# Patient Record
Sex: Male | Born: 1970 | Race: White | Hispanic: No | Marital: Single | Smoking: Current some day smoker
Health system: Southern US, Community
[De-identification: ages and names within clinical notes are randomized; demographics above are authoritative.]

## PROBLEM LIST (undated history)

## (undated) DIAGNOSIS — R51 Headache: Secondary | ICD-10-CM

## (undated) DIAGNOSIS — K219 Gastro-esophageal reflux disease without esophagitis: Secondary | ICD-10-CM

## (undated) DIAGNOSIS — T4145XA Adverse effect of unspecified anesthetic, initial encounter: Secondary | ICD-10-CM

## (undated) DIAGNOSIS — M545 Low back pain, unspecified: Secondary | ICD-10-CM

## (undated) DIAGNOSIS — T8859XA Other complications of anesthesia, initial encounter: Secondary | ICD-10-CM

## (undated) DIAGNOSIS — Z87442 Personal history of urinary calculi: Secondary | ICD-10-CM

## (undated) DIAGNOSIS — F4310 Post-traumatic stress disorder, unspecified: Secondary | ICD-10-CM

## (undated) DIAGNOSIS — K859 Acute pancreatitis without necrosis or infection, unspecified: Secondary | ICD-10-CM

## (undated) DIAGNOSIS — R519 Headache, unspecified: Secondary | ICD-10-CM

## (undated) DIAGNOSIS — F32A Depression, unspecified: Secondary | ICD-10-CM

## (undated) DIAGNOSIS — M1990 Unspecified osteoarthritis, unspecified site: Secondary | ICD-10-CM

## (undated) DIAGNOSIS — Z8489 Family history of other specified conditions: Secondary | ICD-10-CM

## (undated) DIAGNOSIS — M79609 Pain in unspecified limb: Secondary | ICD-10-CM

## (undated) DIAGNOSIS — G629 Polyneuropathy, unspecified: Secondary | ICD-10-CM

## (undated) DIAGNOSIS — Z862 Personal history of diseases of the blood and blood-forming organs and certain disorders involving the immune mechanism: Secondary | ICD-10-CM

## (undated) DIAGNOSIS — F329 Major depressive disorder, single episode, unspecified: Secondary | ICD-10-CM

## (undated) DIAGNOSIS — M419 Scoliosis, unspecified: Secondary | ICD-10-CM

## (undated) DIAGNOSIS — M5020 Other cervical disc displacement, unspecified cervical region: Secondary | ICD-10-CM

## (undated) HISTORY — DX: Scoliosis, unspecified: M41.9

## (undated) HISTORY — DX: Low back pain, unspecified: M54.50

## (undated) HISTORY — DX: Headache: R51

## (undated) HISTORY — DX: Pain in unspecified limb: M79.609

## (undated) HISTORY — DX: Post-traumatic stress disorder, unspecified: F43.10

## (undated) HISTORY — DX: Major depressive disorder, single episode, unspecified: F32.9

## (undated) HISTORY — DX: Depression, unspecified: F32.A

## (undated) HISTORY — PX: ESOPHAGOGASTRODUODENOSCOPY: SHX1529

## (undated) HISTORY — DX: Other cervical disc displacement, unspecified cervical region: M50.20

## (undated) HISTORY — DX: Low back pain: M54.5

## (undated) HISTORY — DX: Headache, unspecified: R51.9

---
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
## 1995-09-15 HISTORY — PX: SALIVARY GLAND SURGERY: SHX768

## 1995-09-15 HISTORY — PX: PAROTIDECTOMY: SUR1003

## 2006-06-03 ENCOUNTER — Ambulatory Visit: Payer: Self-pay | Admitting: Gastroenterology

## 2006-06-09 ENCOUNTER — Ambulatory Visit: Payer: Self-pay | Admitting: Gastroenterology

## 2006-06-11 ENCOUNTER — Other Ambulatory Visit: Payer: Self-pay

## 2006-06-15 ENCOUNTER — Ambulatory Visit: Payer: Self-pay | Admitting: Surgery

## 2006-09-14 HISTORY — PX: CHOLECYSTECTOMY: SHX55

## 2008-03-07 ENCOUNTER — Ambulatory Visit: Payer: Self-pay | Admitting: Gastroenterology

## 2008-04-04 ENCOUNTER — Ambulatory Visit: Payer: Self-pay | Admitting: Gastroenterology

## 2008-05-29 ENCOUNTER — Ambulatory Visit: Payer: Self-pay | Admitting: Gastroenterology

## 2008-09-14 HISTORY — PX: COLONOSCOPY: SHX174

## 2011-02-26 ENCOUNTER — Ambulatory Visit: Payer: Self-pay | Admitting: Internal Medicine

## 2011-03-10 ENCOUNTER — Ambulatory Visit: Payer: Self-pay | Admitting: Internal Medicine

## 2011-04-23 ENCOUNTER — Other Ambulatory Visit: Payer: Self-pay | Admitting: Neurology

## 2011-04-23 DIAGNOSIS — M79605 Pain in left leg: Secondary | ICD-10-CM

## 2011-04-23 DIAGNOSIS — M545 Low back pain: Secondary | ICD-10-CM

## 2011-04-29 ENCOUNTER — Ambulatory Visit
Admission: RE | Admit: 2011-04-29 | Discharge: 2011-04-29 | Disposition: A | Discharge status: Home / Self Care | Payer: Medicaid Other | Source: Ambulatory Visit | Attending: Neurology | Admitting: Neurology

## 2011-04-29 DIAGNOSIS — M545 Low back pain: Secondary | ICD-10-CM

## 2011-04-29 DIAGNOSIS — M79605 Pain in left leg: Secondary | ICD-10-CM

## 2011-04-29 MED ORDER — METHYLPREDNISOLONE ACETATE 40 MG/ML INJ SUSP (RADIOLOG
120.0000 mg | Freq: Once | INTRAMUSCULAR | Status: AC
  Administered 2011-04-29: 120 mg via EPIDURAL

## 2011-04-29 MED ORDER — IOHEXOL 180 MG/ML  SOLN
1.0000 mL | Freq: Once | INTRAMUSCULAR | Status: AC | PRN
  Administered 2011-04-29: 1 mL via INTRA_ARTICULAR

## 2011-06-05 ENCOUNTER — Ambulatory Visit: Payer: Medicaid Other | Admitting: Physical Medicine & Rehabilitation

## 2011-06-23 ENCOUNTER — Encounter: Payer: Medicare Other | Attending: Physical Medicine & Rehabilitation

## 2011-06-23 ENCOUNTER — Ambulatory Visit: Payer: Medicaid Other | Admitting: Physical Medicine & Rehabilitation

## 2011-06-23 DIAGNOSIS — M4130 Thoracogenic scoliosis, site unspecified: Secondary | ICD-10-CM | POA: Insufficient documentation

## 2011-06-23 DIAGNOSIS — M533 Sacrococcygeal disorders, not elsewhere classified: Secondary | ICD-10-CM

## 2011-06-23 DIAGNOSIS — IMO0001 Reserved for inherently not codable concepts without codable children: Secondary | ICD-10-CM | POA: Insufficient documentation

## 2011-06-23 DIAGNOSIS — M545 Low back pain, unspecified: Secondary | ICD-10-CM | POA: Insufficient documentation

## 2011-06-23 DIAGNOSIS — M79609 Pain in unspecified limb: Secondary | ICD-10-CM | POA: Insufficient documentation

## 2011-06-23 NOTE — Progress Notes (Signed)
REASON FOR VISIT:  Left-sided low back and buttock pain.  Pain does radiate into thigh and occasionally into the legs.  HISTORY:  Adam Holloway is a 40 year old male who has a long history of back pain going into the left lower extremity.  He states that he at one point in time was working 12 hours a day and heavily physical job.  He has not worked since 2002.  He has had evaluation by Orthopedics, chiropractic, as well as the Pain Clinic in [place].  He states that the chiropractic treatments were not particularly helpful.  He has undergone neurologic evaluation and also had MRI of the lumbar spine that showed a small central disk protrusion, no evidence of foraminal narrowing.  X-rays of the lumbar spine were normal.  He states that he had some type of facet injections in the past which were not helpful. He had EMG NCV of the lower extremity in the past.  In the actual data, it was reported as normal in the note.  OTHER PAST MEDICAL HISTORY:  Significant for obesity, chronic liver disease, testicular pain, arthritis, depression, GERD.  He states that he has had Barrett's esophagus.  He underwent a left SI joint injection April 29, 2011 which was helpful for 6 days.  This abolished not only his local left-sided low back pain but also his left lower extremity pain.  He also gives a history of scoliosis.  In his thoracic area, no history of trauma.  PAST SURGICAL HISTORY:  Parotid tumor in 1997, gallbladder surgery in 2008, no cervical or thoracic or lumbar surgeries in the past.  SOCIAL HISTORY:  Married, lives with his wife.  No alcohol.  Positive half pack per day smoker and not working.  FAMILY HISTORY:  Heart disease, lung disease, hypertension, alcohol abuse, psychiatric problems, disability.  REVIEW OF SYSTEMS:  Please see health and history form, otherwise negative.  PHYSICAL EXAMINATION:  VITAL SIGNS:  Blood pressure 119/77, pulse 86 respirations 16, O2 sat 98% on  room air. GENERAL:  No acute distress.  Mood and affect appropriate. MUSCULOSKELETAL:  His gait is normal.  His neck range of motion is full. Upper extremity strength and range of motion and sensation are all normal.  Lower extremity sensation strength range of motion are normal with exception of some decreased sensation in the left L5 dermatomal distribution.  There is no evidence of muscle atrophy in the upper or lower extremities.  His back has some tenderness over the left PSIS on the left side only. His lumbar spine range of motion is 75% forward flexion, extension, lateral rotation and bending.  He has a positive FABER maneuver radiating pain into the SI joint area.  No pain in the groin or hip area when I did this.  His hip range of motion is normal bilaterally.  Gait is normal.  His thoracic spine shows a mild dextroconvex scoliosis.  His leg length is equal at 94 cm bilaterally, ASIS to medial malleolus.  IMPRESSION:  The patient has a mild thoracic scoliosis, which I think is aggravating his sacroiliac joint by some pelvic obliquity during ambulation.  Certainly his scoliosis is not severe enough to do any type of surgery on.  He may benefit from a shoe lift but has already tried this with minimal improvement.  I do not think he needs any further imaging studies.  Certainly his sacroiliac injection was diagnostic but unfortunately not therapeutic from a long-term standpoint.  We discussed other treatment options.  Radiofrequency neurotomy  is sometimes useful in this situation, however, the patient is hesitant about undergoing a extensive percutaneous procedure.  We discussed medication management that he would do just as well with tramadol as he would with any other type of medicine and certainly I would not recommend narcotic analgesics in this situation.  He may benefit from a Voltaren gel trial and I have written prescription for that today.  I do not think any further  physical therapy would be needed as this has been tried before.  I would not use any nonsteroidals given his history of Barrett's esophagus.  Discussed with the patient, agrees with plan.  I will see him back on a p.r.n. basis.     Erick Colace, M.D. Electronically Signed    AEK/MedQ D:  06/23/2011 11:45:57  T:  06/23/2011 19:32:59  Job #:  [id number]

## 2011-07-15 ENCOUNTER — Ambulatory Visit: Payer: Self-pay | Admitting: Family

## 2011-07-15 ENCOUNTER — Ambulatory Visit: Payer: Self-pay | Admitting: Urology

## 2011-09-25 DIAGNOSIS — F331 Major depressive disorder, recurrent, moderate: Secondary | ICD-10-CM | POA: Diagnosis not present

## 2011-09-25 DIAGNOSIS — E039 Hypothyroidism, unspecified: Secondary | ICD-10-CM | POA: Diagnosis not present

## 2011-09-25 DIAGNOSIS — F4310 Post-traumatic stress disorder, unspecified: Secondary | ICD-10-CM | POA: Diagnosis not present

## 2011-09-25 DIAGNOSIS — D649 Anemia, unspecified: Secondary | ICD-10-CM | POA: Diagnosis not present

## 2011-09-25 DIAGNOSIS — Z125 Encounter for screening for malignant neoplasm of prostate: Secondary | ICD-10-CM | POA: Diagnosis not present

## 2011-09-25 DIAGNOSIS — E7800 Pure hypercholesterolemia, unspecified: Secondary | ICD-10-CM | POA: Diagnosis not present

## 2011-10-26 DIAGNOSIS — F4310 Post-traumatic stress disorder, unspecified: Secondary | ICD-10-CM | POA: Diagnosis not present

## 2011-10-26 DIAGNOSIS — F331 Major depressive disorder, recurrent, moderate: Secondary | ICD-10-CM | POA: Diagnosis not present

## 2011-10-27 ENCOUNTER — Encounter: Payer: Medicare Other | Attending: Physical Medicine & Rehabilitation

## 2011-10-27 ENCOUNTER — Ambulatory Visit: Payer: Medicare Other | Admitting: Physical Medicine & Rehabilitation

## 2011-10-27 DIAGNOSIS — M79609 Pain in unspecified limb: Secondary | ICD-10-CM | POA: Diagnosis not present

## 2011-10-27 DIAGNOSIS — K769 Liver disease, unspecified: Secondary | ICD-10-CM | POA: Insufficient documentation

## 2011-10-27 DIAGNOSIS — K2270 Barrett's esophagus without dysplasia: Secondary | ICD-10-CM | POA: Diagnosis not present

## 2011-10-27 DIAGNOSIS — M129 Arthropathy, unspecified: Secondary | ICD-10-CM | POA: Diagnosis not present

## 2011-10-27 DIAGNOSIS — E669 Obesity, unspecified: Secondary | ICD-10-CM | POA: Insufficient documentation

## 2011-10-27 DIAGNOSIS — R209 Unspecified disturbances of skin sensation: Secondary | ICD-10-CM | POA: Insufficient documentation

## 2011-10-27 DIAGNOSIS — M25569 Pain in unspecified knee: Secondary | ICD-10-CM | POA: Insufficient documentation

## 2011-10-27 DIAGNOSIS — IMO0002 Reserved for concepts with insufficient information to code with codable children: Secondary | ICD-10-CM | POA: Insufficient documentation

## 2011-10-27 DIAGNOSIS — IMO0001 Reserved for inherently not codable concepts without codable children: Secondary | ICD-10-CM | POA: Insufficient documentation

## 2011-10-27 DIAGNOSIS — K219 Gastro-esophageal reflux disease without esophagitis: Secondary | ICD-10-CM | POA: Diagnosis not present

## 2011-10-27 DIAGNOSIS — M533 Sacrococcygeal disorders, not elsewhere classified: Secondary | ICD-10-CM | POA: Diagnosis not present

## 2011-10-27 NOTE — Assessment & Plan Note (Signed)
A 41 year old male with complaints of left-sided buttock pain.  Some of this pain radiates down to the knee and sometimes below the knee.  He had very good relief of all this pain after sacroiliac injection which helped for 6 days.  I saw him in initial consultation October 9. 2012. Since that time, he has undergone additional chiropractic treatment, which was not particularly helpful.  He has undergone neurological evaluation.  MRI of the spine showed a small central disk protrusion. No foraminal narrowing.  He has been evaluated by Orthopedics, as well as at a Pain Clinic in [place].  PAST MEDICAL HISTORY:  Significant for obesity, chronic liver disease, testicular pain, arthritis,depression, GERD, and Barrett esophagus.  SOCIAL HISTORY:  History of alcohol abuse and drug abuse.  Depression. His opioid risk tool score is 14, putting him at high risk for potential misuse.  He apparently received some hydrocodone from Dr. Anne Hahn, is asking me for something, "stronger than tramadol."  His walking tolerance is 15 minutes.  He can drive.  He does not climb steps.  He uses a shoe lift.  He has mild scoliosis that he attributes that use for.  REVIEW OF SYSTEMS:  Also positive for tingling, trouble walking, spasms, and poor appetite.  OBJECTIVE:  VITAL SIGNS:  Blood pressure 120/67, pulse 74, respiratory rate is 16, O2 saturation 99% on room air, weight 229 pounds,  height 5 feet 9 inches. GENERAL:  A well-developed, well-nourished male in no acute distress. Mood and affect are appropriate. MUSCULOSKELETAL:  He has normal strength in lower extremities.  Hip, knee, and ankle range of motion are normal.  He has tenderness over the PSIS and going up to L5 paraspinals as well on the left side.  Straight leg raising test is negative.  Lower extremity deep tendon reflexes are normal.  IMPRESSION:  Left sacroiliac joint arthropathy.  Given his intolerance to nonsteroidals, we will trial  him on Limbrel, i.e., flavocoxid, which is green tea extract with low GI side effects.  Does require prescription but not formally labeled as an NSAID.  We will give the patient additional information about the sacroiliac radiofrequency neurotomy.  We went over risks and benefits.  Greater than 50% of the half hour appointment was spent counseling and coordination of care, including the use of a spine model to illustrate the procedure, additional discussion of treatment options, and why he is not a good candidate for narcotic analgesic medications on a chronic basis.     Erick Colace, M.D. Electronically Signed    AEK/MedQ D:  10/27/2011 12:00:04  T:  10/27/2011 12:55:12  Job #:  [id number]  cc:   Marlan Palau, M.D. Fax: [phone]

## 2011-11-12 DIAGNOSIS — F320 Major depressive disorder, single episode, mild: Secondary | ICD-10-CM | POA: Diagnosis not present

## 2011-11-23 DIAGNOSIS — E291 Testicular hypofunction: Secondary | ICD-10-CM | POA: Diagnosis not present

## 2011-11-23 DIAGNOSIS — R5383 Other fatigue: Secondary | ICD-10-CM | POA: Diagnosis not present

## 2011-11-23 DIAGNOSIS — M5430 Sciatica, unspecified side: Secondary | ICD-10-CM | POA: Diagnosis not present

## 2011-11-23 DIAGNOSIS — R5381 Other malaise: Secondary | ICD-10-CM | POA: Diagnosis not present

## 2011-11-23 DIAGNOSIS — F320 Major depressive disorder, single episode, mild: Secondary | ICD-10-CM | POA: Diagnosis not present

## 2011-11-23 DIAGNOSIS — F411 Generalized anxiety disorder: Secondary | ICD-10-CM | POA: Diagnosis not present

## 2011-11-23 DIAGNOSIS — F331 Major depressive disorder, recurrent, moderate: Secondary | ICD-10-CM | POA: Diagnosis not present

## 2011-11-23 DIAGNOSIS — F4310 Post-traumatic stress disorder, unspecified: Secondary | ICD-10-CM | POA: Diagnosis not present

## 2011-12-02 DIAGNOSIS — M545 Low back pain: Secondary | ICD-10-CM | POA: Diagnosis not present

## 2011-12-02 DIAGNOSIS — R51 Headache: Secondary | ICD-10-CM | POA: Diagnosis not present

## 2011-12-02 DIAGNOSIS — M79609 Pain in unspecified limb: Secondary | ICD-10-CM | POA: Diagnosis not present

## 2011-12-08 ENCOUNTER — Other Ambulatory Visit: Payer: Self-pay | Admitting: Neurology

## 2011-12-08 DIAGNOSIS — M545 Low back pain, unspecified: Secondary | ICD-10-CM

## 2011-12-08 DIAGNOSIS — R51 Headache: Secondary | ICD-10-CM | POA: Diagnosis not present

## 2011-12-08 DIAGNOSIS — M79609 Pain in unspecified limb: Secondary | ICD-10-CM

## 2011-12-16 ENCOUNTER — Ambulatory Visit
Admission: RE | Admit: 2011-12-16 | Discharge: 2011-12-16 | Disposition: A | Discharge status: Home / Self Care | Payer: Medicare Other | Source: Ambulatory Visit | Attending: Neurology | Admitting: Neurology

## 2011-12-16 DIAGNOSIS — M545 Low back pain, unspecified: Secondary | ICD-10-CM

## 2011-12-16 DIAGNOSIS — M461 Sacroiliitis, not elsewhere classified: Secondary | ICD-10-CM | POA: Diagnosis not present

## 2011-12-16 DIAGNOSIS — M79609 Pain in unspecified limb: Secondary | ICD-10-CM

## 2011-12-16 MED ORDER — METHYLPREDNISOLONE ACETATE 40 MG/ML INJ SUSP (RADIOLOG
120.0000 mg | Freq: Once | INTRAMUSCULAR | Status: AC
  Administered 2011-12-16: 120 mg via INTRA_ARTICULAR

## 2011-12-16 MED ORDER — IOHEXOL 180 MG/ML  SOLN
1.0000 mL | Freq: Once | INTRAMUSCULAR | Status: AC | PRN
  Administered 2011-12-16: 1 mL via INTRA_ARTICULAR

## 2011-12-24 DIAGNOSIS — R1013 Epigastric pain: Secondary | ICD-10-CM | POA: Diagnosis not present

## 2011-12-24 DIAGNOSIS — K2270 Barrett's esophagus without dysplasia: Secondary | ICD-10-CM | POA: Diagnosis not present

## 2011-12-24 DIAGNOSIS — R1011 Right upper quadrant pain: Secondary | ICD-10-CM | POA: Diagnosis not present

## 2011-12-30 DIAGNOSIS — R1011 Right upper quadrant pain: Secondary | ICD-10-CM | POA: Diagnosis not present

## 2011-12-30 DIAGNOSIS — N62 Hypertrophy of breast: Secondary | ICD-10-CM | POA: Diagnosis not present

## 2011-12-30 DIAGNOSIS — N4289 Other specified disorders of prostate: Secondary | ICD-10-CM | POA: Diagnosis not present

## 2011-12-30 DIAGNOSIS — R1013 Epigastric pain: Secondary | ICD-10-CM | POA: Diagnosis not present

## 2012-01-05 DIAGNOSIS — F329 Major depressive disorder, single episode, unspecified: Secondary | ICD-10-CM | POA: Diagnosis not present

## 2012-01-05 DIAGNOSIS — F4310 Post-traumatic stress disorder, unspecified: Secondary | ICD-10-CM | POA: Diagnosis not present

## 2012-01-05 DIAGNOSIS — M549 Dorsalgia, unspecified: Secondary | ICD-10-CM | POA: Diagnosis not present

## 2012-01-05 DIAGNOSIS — M62838 Other muscle spasm: Secondary | ICD-10-CM | POA: Diagnosis not present

## 2012-01-05 DIAGNOSIS — F331 Major depressive disorder, recurrent, moderate: Secondary | ICD-10-CM | POA: Diagnosis not present

## 2012-01-18 DIAGNOSIS — F4310 Post-traumatic stress disorder, unspecified: Secondary | ICD-10-CM | POA: Diagnosis not present

## 2012-01-18 DIAGNOSIS — F331 Major depressive disorder, recurrent, moderate: Secondary | ICD-10-CM | POA: Diagnosis not present

## 2012-01-20 DIAGNOSIS — I471 Supraventricular tachycardia: Secondary | ICD-10-CM | POA: Diagnosis not present

## 2012-02-02 DIAGNOSIS — I471 Supraventricular tachycardia: Secondary | ICD-10-CM | POA: Diagnosis not present

## 2012-02-02 DIAGNOSIS — R0602 Shortness of breath: Secondary | ICD-10-CM | POA: Diagnosis not present

## 2012-02-04 DIAGNOSIS — I471 Supraventricular tachycardia: Secondary | ICD-10-CM | POA: Diagnosis not present

## 2012-03-03 DIAGNOSIS — I10 Essential (primary) hypertension: Secondary | ICD-10-CM | POA: Diagnosis not present

## 2012-03-03 DIAGNOSIS — K2270 Barrett's esophagus without dysplasia: Secondary | ICD-10-CM | POA: Diagnosis not present

## 2012-03-07 DIAGNOSIS — F320 Major depressive disorder, single episode, mild: Secondary | ICD-10-CM | POA: Diagnosis not present

## 2012-03-07 DIAGNOSIS — F4310 Post-traumatic stress disorder, unspecified: Secondary | ICD-10-CM | POA: Diagnosis not present

## 2012-03-22 DIAGNOSIS — F331 Major depressive disorder, recurrent, moderate: Secondary | ICD-10-CM | POA: Diagnosis not present

## 2012-03-22 DIAGNOSIS — F4310 Post-traumatic stress disorder, unspecified: Secondary | ICD-10-CM | POA: Diagnosis not present

## 2012-04-01 DIAGNOSIS — M79609 Pain in unspecified limb: Secondary | ICD-10-CM | POA: Diagnosis not present

## 2012-04-01 DIAGNOSIS — M545 Low back pain: Secondary | ICD-10-CM | POA: Diagnosis not present

## 2012-04-04 ENCOUNTER — Other Ambulatory Visit: Payer: Self-pay | Admitting: Neurology

## 2012-04-04 DIAGNOSIS — M545 Low back pain, unspecified: Secondary | ICD-10-CM

## 2012-04-04 DIAGNOSIS — M79609 Pain in unspecified limb: Secondary | ICD-10-CM

## 2012-04-05 DIAGNOSIS — J00 Acute nasopharyngitis [common cold]: Secondary | ICD-10-CM | POA: Diagnosis not present

## 2012-04-05 DIAGNOSIS — J398 Other specified diseases of upper respiratory tract: Secondary | ICD-10-CM | POA: Diagnosis not present

## 2012-04-14 ENCOUNTER — Other Ambulatory Visit: Payer: Medicare Other

## 2012-04-26 DIAGNOSIS — F4310 Post-traumatic stress disorder, unspecified: Secondary | ICD-10-CM | POA: Diagnosis not present

## 2012-05-26 ENCOUNTER — Other Ambulatory Visit: Payer: Medicare Other

## 2012-06-02 ENCOUNTER — Ambulatory Visit
Admission: RE | Admit: 2012-06-02 | Discharge: 2012-06-02 | Disposition: A | Discharge status: Home / Self Care | Payer: Medicare Other | Source: Ambulatory Visit | Attending: Neurology | Admitting: Neurology

## 2012-06-02 DIAGNOSIS — M79609 Pain in unspecified limb: Secondary | ICD-10-CM

## 2012-06-02 DIAGNOSIS — M545 Low back pain, unspecified: Secondary | ICD-10-CM

## 2012-06-02 MED ORDER — METHYLPREDNISOLONE ACETATE 40 MG/ML INJ SUSP (RADIOLOG
120.0000 mg | Freq: Once | INTRAMUSCULAR | Status: AC
  Administered 2012-06-02: 120 mg via INTRA_ARTICULAR

## 2012-06-02 MED ORDER — IOHEXOL 180 MG/ML  SOLN
1.0000 mL | Freq: Once | INTRAMUSCULAR | Status: AC | PRN
  Administered 2012-06-02: 1 mL via INTRA_ARTICULAR

## 2012-07-13 DIAGNOSIS — E291 Testicular hypofunction: Secondary | ICD-10-CM | POA: Diagnosis not present

## 2012-07-13 DIAGNOSIS — N411 Chronic prostatitis: Secondary | ICD-10-CM | POA: Diagnosis not present

## 2012-07-13 DIAGNOSIS — N508 Other specified disorders of male genital organs: Secondary | ICD-10-CM | POA: Diagnosis not present

## 2012-07-13 DIAGNOSIS — M549 Dorsalgia, unspecified: Secondary | ICD-10-CM | POA: Diagnosis not present

## 2012-07-20 DIAGNOSIS — M5430 Sciatica, unspecified side: Secondary | ICD-10-CM | POA: Diagnosis not present

## 2012-08-19 DIAGNOSIS — F4310 Post-traumatic stress disorder, unspecified: Secondary | ICD-10-CM | POA: Diagnosis not present

## 2012-08-19 DIAGNOSIS — F330 Major depressive disorder, recurrent, mild: Secondary | ICD-10-CM | POA: Diagnosis not present

## 2012-08-19 DIAGNOSIS — F410 Panic disorder [episodic paroxysmal anxiety] without agoraphobia: Secondary | ICD-10-CM | POA: Diagnosis not present

## 2012-08-23 DIAGNOSIS — I10 Essential (primary) hypertension: Secondary | ICD-10-CM | POA: Diagnosis not present

## 2012-08-23 DIAGNOSIS — R198 Other specified symptoms and signs involving the digestive system and abdomen: Secondary | ICD-10-CM | POA: Diagnosis not present

## 2012-08-23 DIAGNOSIS — R1011 Right upper quadrant pain: Secondary | ICD-10-CM | POA: Diagnosis not present

## 2012-09-28 DIAGNOSIS — R1084 Generalized abdominal pain: Secondary | ICD-10-CM | POA: Diagnosis not present

## 2012-10-11 DIAGNOSIS — F330 Major depressive disorder, recurrent, mild: Secondary | ICD-10-CM | POA: Diagnosis not present

## 2012-10-11 DIAGNOSIS — F4310 Post-traumatic stress disorder, unspecified: Secondary | ICD-10-CM | POA: Diagnosis not present

## 2012-10-11 DIAGNOSIS — F410 Panic disorder [episodic paroxysmal anxiety] without agoraphobia: Secondary | ICD-10-CM | POA: Diagnosis not present

## 2012-10-13 DIAGNOSIS — R51 Headache: Secondary | ICD-10-CM | POA: Diagnosis not present

## 2012-10-13 DIAGNOSIS — M545 Low back pain: Secondary | ICD-10-CM | POA: Diagnosis not present

## 2012-10-21 ENCOUNTER — Other Ambulatory Visit: Payer: Self-pay | Admitting: Neurology

## 2012-10-21 DIAGNOSIS — M545 Low back pain: Secondary | ICD-10-CM

## 2012-11-07 DIAGNOSIS — R1011 Right upper quadrant pain: Secondary | ICD-10-CM | POA: Diagnosis not present

## 2012-11-07 DIAGNOSIS — K2270 Barrett's esophagus without dysplasia: Secondary | ICD-10-CM | POA: Diagnosis not present

## 2012-11-07 DIAGNOSIS — Z800 Family history of malignant neoplasm of digestive organs: Secondary | ICD-10-CM | POA: Diagnosis not present

## 2012-11-14 ENCOUNTER — Other Ambulatory Visit: Payer: Medicare Other

## 2012-11-21 ENCOUNTER — Inpatient Hospital Stay: Admission: RE | Admit: 2012-11-21 | Payer: Medicare Other | Source: Ambulatory Visit

## 2012-12-29 DIAGNOSIS — F410 Panic disorder [episodic paroxysmal anxiety] without agoraphobia: Secondary | ICD-10-CM | POA: Diagnosis not present

## 2012-12-29 DIAGNOSIS — F330 Major depressive disorder, recurrent, mild: Secondary | ICD-10-CM | POA: Diagnosis not present

## 2012-12-29 DIAGNOSIS — F4310 Post-traumatic stress disorder, unspecified: Secondary | ICD-10-CM | POA: Diagnosis not present

## 2013-03-28 DIAGNOSIS — IMO0002 Reserved for concepts with insufficient information to code with codable children: Secondary | ICD-10-CM | POA: Diagnosis not present

## 2013-03-28 DIAGNOSIS — F4310 Post-traumatic stress disorder, unspecified: Secondary | ICD-10-CM | POA: Diagnosis not present

## 2013-03-28 DIAGNOSIS — M62838 Other muscle spasm: Secondary | ICD-10-CM | POA: Diagnosis not present

## 2013-03-28 DIAGNOSIS — F330 Major depressive disorder, recurrent, mild: Secondary | ICD-10-CM | POA: Diagnosis not present

## 2013-03-28 DIAGNOSIS — F411 Generalized anxiety disorder: Secondary | ICD-10-CM | POA: Diagnosis not present

## 2013-03-28 DIAGNOSIS — F410 Panic disorder [episodic paroxysmal anxiety] without agoraphobia: Secondary | ICD-10-CM | POA: Diagnosis not present

## 2013-03-28 DIAGNOSIS — M549 Dorsalgia, unspecified: Secondary | ICD-10-CM | POA: Diagnosis not present

## 2013-04-11 DIAGNOSIS — F330 Major depressive disorder, recurrent, mild: Secondary | ICD-10-CM | POA: Diagnosis not present

## 2013-04-11 DIAGNOSIS — F410 Panic disorder [episodic paroxysmal anxiety] without agoraphobia: Secondary | ICD-10-CM | POA: Diagnosis not present

## 2013-04-11 DIAGNOSIS — F4310 Post-traumatic stress disorder, unspecified: Secondary | ICD-10-CM | POA: Diagnosis not present

## 2013-06-02 DIAGNOSIS — J069 Acute upper respiratory infection, unspecified: Secondary | ICD-10-CM | POA: Diagnosis not present

## 2013-06-27 DIAGNOSIS — F4310 Post-traumatic stress disorder, unspecified: Secondary | ICD-10-CM | POA: Diagnosis not present

## 2013-06-27 DIAGNOSIS — F410 Panic disorder [episodic paroxysmal anxiety] without agoraphobia: Secondary | ICD-10-CM | POA: Diagnosis not present

## 2013-06-27 DIAGNOSIS — F330 Major depressive disorder, recurrent, mild: Secondary | ICD-10-CM | POA: Diagnosis not present

## 2013-06-29 DIAGNOSIS — Z0000 Encounter for general adult medical examination without abnormal findings: Secondary | ICD-10-CM | POA: Diagnosis not present

## 2013-06-29 DIAGNOSIS — K769 Liver disease, unspecified: Secondary | ICD-10-CM | POA: Diagnosis not present

## 2013-06-29 DIAGNOSIS — Z125 Encounter for screening for malignant neoplasm of prostate: Secondary | ICD-10-CM | POA: Diagnosis not present

## 2013-06-29 DIAGNOSIS — I471 Supraventricular tachycardia: Secondary | ICD-10-CM | POA: Diagnosis not present

## 2013-06-29 DIAGNOSIS — E669 Obesity, unspecified: Secondary | ICD-10-CM | POA: Diagnosis not present

## 2013-06-29 DIAGNOSIS — R5381 Other malaise: Secondary | ICD-10-CM | POA: Diagnosis not present

## 2013-07-12 ENCOUNTER — Ambulatory Visit (INDEPENDENT_AMBULATORY_CARE_PROVIDER_SITE_OTHER): Payer: Medicare Other | Admitting: General Surgery

## 2013-07-12 ENCOUNTER — Encounter: Payer: Self-pay | Admitting: General Surgery

## 2013-07-12 VITALS — BP 118/82 | HR 74 | Resp 14 | Ht 73.0 in | Wt 226.0 lb

## 2013-07-12 DIAGNOSIS — N62 Hypertrophy of breast: Secondary | ICD-10-CM | POA: Diagnosis not present

## 2013-07-12 NOTE — Progress Notes (Signed)
Patient ID: Adam Holloway, male   DOB: [birth year], 42 y.o.   MRN: [id number]  Chief Complaint  Patient presents with  . Other    gynocomasty    HPI Adam Holloway is a 42 y.o. male.  Here today for evaluation of gynecomasty referred by Dr. Juel Burrow.  He noticed it about  2 years ago. States the left is more painful and prominent than the right and has been worse over the past 6 months. Denies family history of breast issues or cancer. HPI  Past Medical History  Diagnosis Date  . Depression   . Scoliosis     Past Surgical History  Procedure Laterality Date  . Cholecystectomy  2008  . Salivary gland surgery  1997    Family History  Problem Relation Age of Onset  . Cancer Father     Social History History  Substance Use Topics  . Smoking status: Former Smoker -- 0.50 packs/day for 27 years    Types: Cigarettes  . Smokeless tobacco: Never Used  . Alcohol Use: No    Allergies  Allergen Reactions  . Augmentin [Amoxicillin-Pot Clavulanate] Anaphylaxis    Current Outpatient Prescriptions  Medication Sig Dispense Refill  . clonazePAM (KLONOPIN) 0.5 MG tablet Take 0.5 mg by mouth 2 (two) times daily as needed for anxiety.      . DULoxetine (CYMBALTA) 60 MG capsule Take 60 mg by mouth daily.      . traMADol (ULTRAM) 50 MG tablet Take 50 mg by mouth 2 (two) times daily.       No current facility-administered medications for this visit.    Review of Systems Review of Systems  Constitutional: Negative.   Respiratory: Negative.   Cardiovascular: Negative.     Blood pressure 118/82, pulse 74, resp. rate 14, height 6\' 1"  (1.854 m), weight 226 lb (102.513 kg).  Physical Exam Physical Exam  Constitutional: He is oriented to person, place, and time. He appears well-developed and well-nourished.  Eyes: Conjunctivae are normal. No scleral icterus.  Neck: Neck supple. No thyromegaly present.  Cardiovascular: Normal rate, regular rhythm and normal heart sounds.    Pulmonary/Chest: Effort normal and breath sounds normal. Right breast exhibits no inverted nipple, no mass, no nipple discharge, no skin change and no tenderness. Left breast exhibits tenderness (focal tenderness and fullness at 6 oclock location). Left breast exhibits no inverted nipple, no mass, no nipple discharge and no skin change.  Lymphadenopathy:    He has no cervical adenopathy.    He has no axillary adenopathy.  Neurological: He is alert and oriented to person, place, and time.  Skin: Skin is warm and dry.    Data Reviewed Dr. Tilda Franco office notes.  Assessment    Gynecomastia- likely induced by varying anti depression meds he ha was on in the past. His current meds were started after he developed gynecomastia. Over the last 35yrs the breast swelling has not resolved . He is symptomatic on the left with pain that is worsening over the last 1-2 years. A subcutaneous mastectomy on the left breast is reasonable because of the pain.      Plan    Discussed risk and benefits of subcutaneous mastectomy surgery left breast. Procedure explained. Pt is agreeable.       Makaelyn Aponte G 07/12/2013, 9:21 AM

## 2013-07-12 NOTE — Patient Instructions (Addendum)
The patient is aware to call back for any questions or concerns.  Subcutaneous mastectomy surgery left breast

## 2013-07-13 ENCOUNTER — Telehealth: Payer: Self-pay | Admitting: *Deleted

## 2013-07-13 NOTE — Telephone Encounter (Signed)
Notified patient as instructed, patient pleased. Discussed follow-up appointments, for scheduled surgery, patient agrees

## 2013-07-13 NOTE — Telephone Encounter (Signed)
Message copied by Currie Paris on Thu Jul 13, 2013  8:21 AM ------      Message from: Kieth Brightly      Created: Thu Jul 13, 2013  5:59 AM       Inform pt labs are normal. F/u as scheduled ------

## 2013-07-13 NOTE — Progress Notes (Signed)
Quick Note:  Inform pt labs are normal. F/u as scheduled ______ 

## 2013-08-30 ENCOUNTER — Telehealth: Payer: Self-pay | Admitting: *Deleted

## 2013-08-30 NOTE — Telephone Encounter (Signed)
Patient called the office to report he may want to proceed with surgery sometime in February 2015. He is still undecided at this time and will call the office back when ready to arrange.

## 2013-09-21 DIAGNOSIS — F4310 Post-traumatic stress disorder, unspecified: Secondary | ICD-10-CM | POA: Diagnosis not present

## 2013-09-21 DIAGNOSIS — F330 Major depressive disorder, recurrent, mild: Secondary | ICD-10-CM | POA: Diagnosis not present

## 2013-09-21 DIAGNOSIS — F410 Panic disorder [episodic paroxysmal anxiety] without agoraphobia: Secondary | ICD-10-CM | POA: Diagnosis not present

## 2013-10-12 DIAGNOSIS — F410 Panic disorder [episodic paroxysmal anxiety] without agoraphobia: Secondary | ICD-10-CM | POA: Diagnosis not present

## 2013-10-12 DIAGNOSIS — F330 Major depressive disorder, recurrent, mild: Secondary | ICD-10-CM | POA: Diagnosis not present

## 2013-10-12 DIAGNOSIS — F4310 Post-traumatic stress disorder, unspecified: Secondary | ICD-10-CM | POA: Diagnosis not present

## 2013-11-17 DIAGNOSIS — F410 Panic disorder [episodic paroxysmal anxiety] without agoraphobia: Secondary | ICD-10-CM | POA: Diagnosis not present

## 2013-11-17 DIAGNOSIS — F4310 Post-traumatic stress disorder, unspecified: Secondary | ICD-10-CM | POA: Diagnosis not present

## 2013-11-17 DIAGNOSIS — F330 Major depressive disorder, recurrent, mild: Secondary | ICD-10-CM | POA: Diagnosis not present

## 2014-01-03 DIAGNOSIS — M47812 Spondylosis without myelopathy or radiculopathy, cervical region: Secondary | ICD-10-CM | POA: Diagnosis not present

## 2014-01-03 DIAGNOSIS — M4120 Other idiopathic scoliosis, site unspecified: Secondary | ICD-10-CM | POA: Diagnosis not present

## 2014-01-17 ENCOUNTER — Ambulatory Visit: Payer: Self-pay | Admitting: Internal Medicine

## 2014-01-17 DIAGNOSIS — M25569 Pain in unspecified knee: Secondary | ICD-10-CM | POA: Diagnosis not present

## 2014-02-14 DIAGNOSIS — F330 Major depressive disorder, recurrent, mild: Secondary | ICD-10-CM | POA: Diagnosis not present

## 2014-02-14 DIAGNOSIS — F4310 Post-traumatic stress disorder, unspecified: Secondary | ICD-10-CM | POA: Diagnosis not present

## 2014-02-14 DIAGNOSIS — F410 Panic disorder [episodic paroxysmal anxiety] without agoraphobia: Secondary | ICD-10-CM | POA: Diagnosis not present

## 2014-02-21 DIAGNOSIS — E291 Testicular hypofunction: Secondary | ICD-10-CM | POA: Diagnosis not present

## 2014-02-21 DIAGNOSIS — N62 Hypertrophy of breast: Secondary | ICD-10-CM | POA: Diagnosis not present

## 2014-02-21 DIAGNOSIS — J029 Acute pharyngitis, unspecified: Secondary | ICD-10-CM | POA: Diagnosis not present

## 2014-02-21 DIAGNOSIS — I471 Supraventricular tachycardia: Secondary | ICD-10-CM | POA: Diagnosis not present

## 2014-05-23 DIAGNOSIS — F4310 Post-traumatic stress disorder, unspecified: Secondary | ICD-10-CM | POA: Diagnosis not present

## 2014-05-23 DIAGNOSIS — F330 Major depressive disorder, recurrent, mild: Secondary | ICD-10-CM | POA: Diagnosis not present

## 2014-05-23 DIAGNOSIS — F410 Panic disorder [episodic paroxysmal anxiety] without agoraphobia: Secondary | ICD-10-CM | POA: Diagnosis not present

## 2014-07-18 DIAGNOSIS — Z87891 Personal history of nicotine dependence: Secondary | ICD-10-CM | POA: Diagnosis not present

## 2014-07-18 DIAGNOSIS — F328 Other depressive episodes: Secondary | ICD-10-CM | POA: Diagnosis not present

## 2014-07-18 DIAGNOSIS — M5430 Sciatica, unspecified side: Secondary | ICD-10-CM | POA: Diagnosis not present

## 2014-07-18 DIAGNOSIS — M4120 Other idiopathic scoliosis, site unspecified: Secondary | ICD-10-CM | POA: Diagnosis not present

## 2014-08-16 DIAGNOSIS — F4312 Post-traumatic stress disorder, chronic: Secondary | ICD-10-CM | POA: Diagnosis not present

## 2014-08-16 DIAGNOSIS — F330 Major depressive disorder, recurrent, mild: Secondary | ICD-10-CM | POA: Diagnosis not present

## 2014-08-16 DIAGNOSIS — F410 Panic disorder [episodic paroxysmal anxiety] without agoraphobia: Secondary | ICD-10-CM | POA: Diagnosis not present

## 2014-10-17 DIAGNOSIS — H8302 Labyrinthitis, left ear: Secondary | ICD-10-CM | POA: Diagnosis not present

## 2014-10-17 DIAGNOSIS — M4120 Other idiopathic scoliosis, site unspecified: Secondary | ICD-10-CM | POA: Diagnosis not present

## 2014-10-18 DIAGNOSIS — F3132 Bipolar disorder, current episode depressed, moderate: Secondary | ICD-10-CM | POA: Diagnosis not present

## 2014-11-13 DIAGNOSIS — F410 Panic disorder [episodic paroxysmal anxiety] without agoraphobia: Secondary | ICD-10-CM | POA: Diagnosis not present

## 2014-11-13 DIAGNOSIS — F4312 Post-traumatic stress disorder, chronic: Secondary | ICD-10-CM | POA: Diagnosis not present

## 2014-11-13 DIAGNOSIS — F330 Major depressive disorder, recurrent, mild: Secondary | ICD-10-CM | POA: Diagnosis not present

## 2014-12-05 ENCOUNTER — Ambulatory Visit (INDEPENDENT_AMBULATORY_CARE_PROVIDER_SITE_OTHER): Payer: Medicare Other | Admitting: Adult Health

## 2014-12-05 ENCOUNTER — Encounter: Payer: Self-pay | Admitting: Adult Health

## 2014-12-05 DIAGNOSIS — M5442 Lumbago with sciatica, left side: Secondary | ICD-10-CM

## 2014-12-05 DIAGNOSIS — R51 Headache: Secondary | ICD-10-CM

## 2014-12-05 DIAGNOSIS — M4120 Other idiopathic scoliosis, site unspecified: Secondary | ICD-10-CM | POA: Diagnosis not present

## 2014-12-05 DIAGNOSIS — M545 Low back pain, unspecified: Secondary | ICD-10-CM | POA: Insufficient documentation

## 2014-12-05 DIAGNOSIS — M79609 Pain in unspecified limb: Secondary | ICD-10-CM | POA: Insufficient documentation

## 2014-12-05 DIAGNOSIS — R519 Headache, unspecified: Secondary | ICD-10-CM | POA: Insufficient documentation

## 2014-12-05 NOTE — Progress Notes (Signed)
I have read the note, and I agree with the clinical assessment and plan.  Kesley Gaffey KEITH   

## 2014-12-05 NOTE — Progress Notes (Signed)
PATIENT: Adam Holloway DOB: Jun 18, 1971  REASON FOR VISIT: follow up HISTORY FROM: patient  HISTORY OF PRESENT ILLNESS: Adam Holloway is a 44 year old male with a history low back pain that radiates to the left leg. He returns today for follow-up. The patient has not been seen through our office since 2014. At that time it was recommended that he have SI joint injections and if that was not beneficial then he would be referred to a pain center. The patient states that he has continued to have pain in the lower back that radiates down the left leg. He does do 5 different exercises every morning and reports that that gives him good benefit. He also bought a chair that allows him to hang upside down that helps with the alignment of his back. He states due to his scoliosis he also has mid thoracic and cervical pain as well. He states that over time the pain has gotten worse and the exercises only offer him temporary relief. The patient also reports that he is having some dizziness. He states this started after his psychiatrist started him on Restoril. He returns today for an evaluation.  REVIEW OF SYSTEMS: Out of a complete 14 system review of symptoms, the patient complains only of the following symptoms, and all other reviewed systems are negative.   Unexpected weight change, loss of vision, abdominal pain, nausea, insomnia, joint swelling, joint pain, back pain, walking difficulty, neck pain, neck stiffness, wounds, dizziness, headache, confusion, depression, swollen lymph nodes   ALLERGIES: Allergies  Allergen Reactions  . Augmentin [Amoxicillin-Pot Clavulanate] Anaphylaxis    HOME MEDICATIONS: Outpatient Prescriptions Prior to Visit  Medication Sig Dispense Refill  . clonazePAM (KLONOPIN) 0.5 MG tablet Take 0.5 mg by mouth 2 (two) times daily as needed for anxiety.    . DULoxetine (CYMBALTA) 60 MG capsule Take 60 mg by mouth daily.    . traMADol (ULTRAM) 50 MG tablet Take 50 mg by  mouth 2 (two) times daily.     No facility-administered medications prior to visit.    PAST MEDICAL HISTORY: Past Medical History  Diagnosis Date  . Depression   . Scoliosis   . HA (headache)   . Pain in soft tissues of limb   . Lumbago     PAST SURGICAL HISTORY: Past Surgical History  Procedure Laterality Date  . Cholecystectomy  2008  . Salivary gland surgery  1997    FAMILY HISTORY: Family History  Problem Relation Age of Onset  . Cancer Father   . Heart Problems Father     SOCIAL HISTORY: History   Social History  . Marital Status: Single    Spouse Name: N/A  . Number of Children: 1  . Years of Education: GED   Occupational History  .      Disabled   Social History Main Topics  . Smoking status: Former Smoker -- 0.50 packs/day for 27 years    Types: Cigarettes  . Smokeless tobacco: Never Used     Comment: Quit 2014  . Alcohol Use: No  . Drug Use: No  . Sexual Activity: Not on file   Other Topics Concern  . Not on file   Social History Narrative   Patient lives at home with his mother.   Disabled.   Education GED.   Right handed.   Caffeine two cups of coffee daily.      PHYSICAL EXAM Orthostatic VS for the past 24 hrs:  BP- Lying Pulse-  Lying BP- Sitting Pulse- Sitting BP- Standing at 0 minutes Pulse- Standing at 0 minutes  12/05/14 1332 103/64 mmHg 83 113/79 mmHg 79 115/81 mmHg 103       Filed Vitals:   12/05/14 1331  BP: 113/79  Pulse: 81  Height: 6' 0.5" (1.842 m)  Weight: 217 lb (98.431 kg)   Body mass index is 29.01 kg/(m^2).  Generalized: Well developed, in no acute distress   Neurological examination  Mentation: Alert oriented to time, place, history taking. Follows all commands speech and language fluent Cranial nerve II-XII: Pupils were equal round reactive to light. Extraocular movements were full, visual field were full on confrontational test. Facial sensation and strength were normal. Uvula tongue midline. Head  turning and shoulder shrug  were normal and symmetric. Motor: The motor testing reveals 5 over 5 strength of all 4 extremities. Good symmetric motor tone is noted throughout. Pain in the lower back with flexion and extension of the spine. Now pain with rotation to the right. Sensory: Sensory testing is intact to soft touch on all 4 extremities. No evidence of extinction is noted.  Coordination: Cerebellar testing reveals good finger-nose-finger and heel-to-shin bilaterally.  Gait and station: Gait is normal. Tandem gait is normal. Romberg is negative. No drift is seen.  Reflexes: Deep tendon reflexes are symmetric and normal bilaterally.    DIAGNOSTIC DATA (LABS, IMAGING, TESTING) - I reviewed patient records, labs, notes, testing and imaging myself where available.     ASSESSMENT AND PLAN 44 y.o. year old male  has a past medical history of Depression; Scoliosis; HA (headache); Pain in soft tissues of limb; and Lumbago. here with:  1. Low back pain radiating to the left leg 2. Scoliosis  The patient returns today complaining of worsening of the low back pain as well as pain in the thoracic and cervical area. The patient contributes his pain to scoliosis. He has tried SI joint injections without any benefit. I have recommended that the patient be referred to a pain clinic to help manage his ongoing pain. Patient is amenable to this. I will put the referral in. In the meantime the patient can try Tylenol or ibuprofen for pain. He can follow-up with [place] on an as-needed basis.    Ward Givens, MSN, NP-C 12/05/2014, 1:36 PM [facility] [address], [place],  [address] [phone]  Note: This document was prepared with digital dictation and possible smart phrase technology. Any transcriptional errors that result from this process are unintentional.

## 2014-12-05 NOTE — Patient Instructions (Addendum)
Referred to Pain clinic. Our office will call with appointment. Can try tylenol or Ibuprofen for pain.

## 2015-01-23 DIAGNOSIS — I471 Supraventricular tachycardia: Secondary | ICD-10-CM | POA: Diagnosis not present

## 2015-01-23 DIAGNOSIS — M4120 Other idiopathic scoliosis, site unspecified: Secondary | ICD-10-CM | POA: Diagnosis not present

## 2015-01-23 DIAGNOSIS — R42 Dizziness and giddiness: Secondary | ICD-10-CM | POA: Diagnosis not present

## 2015-01-23 DIAGNOSIS — M5430 Sciatica, unspecified side: Secondary | ICD-10-CM | POA: Diagnosis not present

## 2015-01-24 DIAGNOSIS — I10 Essential (primary) hypertension: Secondary | ICD-10-CM | POA: Diagnosis not present

## 2015-01-24 DIAGNOSIS — E784 Other hyperlipidemia: Secondary | ICD-10-CM | POA: Diagnosis not present

## 2015-01-24 DIAGNOSIS — R5381 Other malaise: Secondary | ICD-10-CM | POA: Diagnosis not present

## 2015-02-07 DIAGNOSIS — F330 Major depressive disorder, recurrent, mild: Secondary | ICD-10-CM | POA: Diagnosis not present

## 2015-02-07 DIAGNOSIS — F4312 Post-traumatic stress disorder, chronic: Secondary | ICD-10-CM | POA: Diagnosis not present

## 2015-02-07 DIAGNOSIS — F410 Panic disorder [episodic paroxysmal anxiety] without agoraphobia: Secondary | ICD-10-CM | POA: Diagnosis not present

## 2015-06-04 DIAGNOSIS — K219 Gastro-esophageal reflux disease without esophagitis: Secondary | ICD-10-CM | POA: Diagnosis not present

## 2015-06-04 DIAGNOSIS — I471 Supraventricular tachycardia: Secondary | ICD-10-CM | POA: Diagnosis not present

## 2015-06-04 DIAGNOSIS — Z87891 Personal history of nicotine dependence: Secondary | ICD-10-CM | POA: Diagnosis not present

## 2015-06-04 DIAGNOSIS — M4120 Other idiopathic scoliosis, site unspecified: Secondary | ICD-10-CM | POA: Diagnosis not present

## 2015-07-18 DIAGNOSIS — F4310 Post-traumatic stress disorder, unspecified: Secondary | ICD-10-CM | POA: Diagnosis not present

## 2015-07-18 DIAGNOSIS — F331 Major depressive disorder, recurrent, moderate: Secondary | ICD-10-CM | POA: Diagnosis not present

## 2015-08-01 DIAGNOSIS — F331 Major depressive disorder, recurrent, moderate: Secondary | ICD-10-CM | POA: Diagnosis not present

## 2015-08-01 DIAGNOSIS — F4310 Post-traumatic stress disorder, unspecified: Secondary | ICD-10-CM | POA: Diagnosis not present

## 2015-08-20 DIAGNOSIS — F4310 Post-traumatic stress disorder, unspecified: Secondary | ICD-10-CM | POA: Diagnosis not present

## 2015-08-20 DIAGNOSIS — F331 Major depressive disorder, recurrent, moderate: Secondary | ICD-10-CM | POA: Diagnosis not present

## 2015-08-22 DIAGNOSIS — F331 Major depressive disorder, recurrent, moderate: Secondary | ICD-10-CM | POA: Diagnosis not present

## 2015-08-22 DIAGNOSIS — F4310 Post-traumatic stress disorder, unspecified: Secondary | ICD-10-CM | POA: Diagnosis not present

## 2015-09-27 DIAGNOSIS — K602 Anal fissure, unspecified: Secondary | ICD-10-CM | POA: Diagnosis not present

## 2015-09-27 DIAGNOSIS — M4120 Other idiopathic scoliosis, site unspecified: Secondary | ICD-10-CM | POA: Diagnosis not present

## 2015-09-27 DIAGNOSIS — K219 Gastro-esophageal reflux disease without esophagitis: Secondary | ICD-10-CM | POA: Diagnosis not present

## 2015-09-27 DIAGNOSIS — G5700 Lesion of sciatic nerve, unspecified lower limb: Secondary | ICD-10-CM | POA: Diagnosis not present

## 2015-10-28 DIAGNOSIS — K219 Gastro-esophageal reflux disease without esophagitis: Secondary | ICD-10-CM | POA: Diagnosis not present

## 2015-10-28 DIAGNOSIS — K602 Anal fissure, unspecified: Secondary | ICD-10-CM | POA: Diagnosis not present

## 2015-10-28 DIAGNOSIS — G5700 Lesion of sciatic nerve, unspecified lower limb: Secondary | ICD-10-CM | POA: Diagnosis not present

## 2015-10-28 DIAGNOSIS — M4120 Other idiopathic scoliosis, site unspecified: Secondary | ICD-10-CM | POA: Diagnosis not present

## 2015-12-13 ENCOUNTER — Ambulatory Visit
Admission: RE | Admit: 2015-12-13 | Discharge: 2015-12-13 | Disposition: A | Discharge status: Home / Self Care | Payer: Medicare Other | Source: Ambulatory Visit | Attending: Internal Medicine | Admitting: Internal Medicine

## 2015-12-13 ENCOUNTER — Ambulatory Visit
Admission: RE | Admit: 2015-12-13 | Discharge: 2015-12-13 | Disposition: A | Discharge status: Home / Self Care | Payer: Medicare Other | Source: Ambulatory Visit | Attending: Cardiology | Admitting: Cardiology

## 2015-12-13 ENCOUNTER — Other Ambulatory Visit: Payer: Self-pay | Admitting: Cardiology

## 2015-12-13 DIAGNOSIS — M25511 Pain in right shoulder: Secondary | ICD-10-CM

## 2015-12-13 DIAGNOSIS — M75101 Unspecified rotator cuff tear or rupture of right shoulder, not specified as traumatic: Secondary | ICD-10-CM | POA: Diagnosis not present

## 2015-12-13 DIAGNOSIS — K602 Anal fissure, unspecified: Secondary | ICD-10-CM | POA: Diagnosis not present

## 2015-12-13 DIAGNOSIS — G5700 Lesion of sciatic nerve, unspecified lower limb: Secondary | ICD-10-CM | POA: Diagnosis not present

## 2015-12-13 DIAGNOSIS — M4120 Other idiopathic scoliosis, site unspecified: Secondary | ICD-10-CM | POA: Diagnosis not present

## 2015-12-16 ENCOUNTER — Other Ambulatory Visit: Payer: Self-pay | Admitting: Internal Medicine

## 2015-12-16 ENCOUNTER — Ambulatory Visit
Admission: RE | Admit: 2015-12-16 | Discharge: 2015-12-16 | Disposition: A | Discharge status: Home / Self Care | Payer: Medicare Other | Source: Ambulatory Visit | Attending: Internal Medicine | Admitting: Internal Medicine

## 2015-12-16 DIAGNOSIS — R52 Pain, unspecified: Secondary | ICD-10-CM

## 2015-12-16 DIAGNOSIS — M47892 Other spondylosis, cervical region: Secondary | ICD-10-CM | POA: Insufficient documentation

## 2015-12-16 DIAGNOSIS — M539 Dorsopathy, unspecified: Secondary | ICD-10-CM | POA: Diagnosis not present

## 2015-12-16 DIAGNOSIS — M47812 Spondylosis without myelopathy or radiculopathy, cervical region: Secondary | ICD-10-CM | POA: Diagnosis not present

## 2015-12-16 DIAGNOSIS — M25511 Pain in right shoulder: Secondary | ICD-10-CM | POA: Insufficient documentation

## 2015-12-16 DIAGNOSIS — M25519 Pain in unspecified shoulder: Secondary | ICD-10-CM | POA: Diagnosis not present

## 2015-12-16 DIAGNOSIS — M546 Pain in thoracic spine: Secondary | ICD-10-CM | POA: Diagnosis not present

## 2015-12-17 ENCOUNTER — Other Ambulatory Visit: Payer: Self-pay | Admitting: Internal Medicine

## 2015-12-17 DIAGNOSIS — G5700 Lesion of sciatic nerve, unspecified lower limb: Secondary | ICD-10-CM

## 2015-12-17 DIAGNOSIS — M4120 Other idiopathic scoliosis, site unspecified: Secondary | ICD-10-CM

## 2015-12-23 ENCOUNTER — Ambulatory Visit
Admission: RE | Admit: 2015-12-23 | Discharge: 2015-12-23 | Disposition: A | Discharge status: Home / Self Care | Payer: Medicare Other | Source: Ambulatory Visit | Attending: Internal Medicine | Admitting: Internal Medicine

## 2015-12-23 DIAGNOSIS — M549 Dorsalgia, unspecified: Secondary | ICD-10-CM | POA: Insufficient documentation

## 2015-12-23 DIAGNOSIS — M5134 Other intervertebral disc degeneration, thoracic region: Secondary | ICD-10-CM | POA: Diagnosis not present

## 2015-12-23 DIAGNOSIS — M4120 Other idiopathic scoliosis, site unspecified: Secondary | ICD-10-CM

## 2015-12-23 DIAGNOSIS — G5700 Lesion of sciatic nerve, unspecified lower limb: Secondary | ICD-10-CM

## 2015-12-23 DIAGNOSIS — M4184 Other forms of scoliosis, thoracic region: Secondary | ICD-10-CM | POA: Insufficient documentation

## 2015-12-25 DIAGNOSIS — M539 Dorsopathy, unspecified: Secondary | ICD-10-CM | POA: Diagnosis not present

## 2015-12-25 DIAGNOSIS — M25519 Pain in unspecified shoulder: Secondary | ICD-10-CM | POA: Diagnosis not present

## 2015-12-25 DIAGNOSIS — M5416 Radiculopathy, lumbar region: Secondary | ICD-10-CM | POA: Diagnosis not present

## 2015-12-25 DIAGNOSIS — M546 Pain in thoracic spine: Secondary | ICD-10-CM | POA: Diagnosis not present

## 2016-01-16 ENCOUNTER — Ambulatory Visit (INDEPENDENT_AMBULATORY_CARE_PROVIDER_SITE_OTHER): Payer: Medicare Other | Admitting: Adult Health

## 2016-01-16 ENCOUNTER — Encounter: Payer: Self-pay | Admitting: Adult Health

## 2016-01-16 VITALS — BP 114/75 | HR 85 | Ht 72.5 in | Wt 208.0 lb

## 2016-01-16 DIAGNOSIS — M549 Dorsalgia, unspecified: Secondary | ICD-10-CM | POA: Diagnosis not present

## 2016-01-16 DIAGNOSIS — M419 Scoliosis, unspecified: Secondary | ICD-10-CM | POA: Diagnosis not present

## 2016-01-16 DIAGNOSIS — G8929 Other chronic pain: Secondary | ICD-10-CM | POA: Diagnosis not present

## 2016-01-16 NOTE — Progress Notes (Signed)
PATIENT: Adam Holloway DOB: 09-13-1971  REASON FOR VISIT: follow up- back pain HISTORY FROM: patient  HISTORY OF PRESENT ILLNESS: Adam Holloway is a 45 year old male with a history of low back pain. He returns today for follow-up. At the last visit the patient was continued to have ongoing pain. He received SI joint injections without benefit. He was referred to a pain clinic. Patient states that the pain clinic all once and he tried to call back to reschedule an appointment but never was able to. He states that he's been having pain down the right shoulder but now goes into the left shoulder. His primary care did a thoracic MRI that was relatively unremarkable. He does have mild scoliosis. He feels that the scoliosis is the cause of his discomfort. He is currently using Ultram. He has tried using Vicodin however he states that he has some difficulty breathing with this medication. Denies any burning and tingling down the arms. Denies any numbness in the arms. Denies weakness in any extremity. No change in the bowels or bladder. Denies any change in his gait. Denies any falls. He returns today for an evaluation.  HISTORY 12/05/14 (MM): Adam Holloway is a 45 year old male with a history low back pain that radiates to the left leg. He returns today for follow-up. The patient has not been seen through our office since 2014. At that time it was recommended that he have SI joint injections and if that was not beneficial then he would be referred to a pain center. The patient states that he has continued to have pain in the lower back that radiates down the left leg. He does do 5 different exercises every morning and reports that that gives him good benefit. He also bought a chair that allows him to hang upside down that helps with the alignment of his back. He states due to his scoliosis he also has mid thoracic and cervical pain as well. He states that over time the pain has gotten worse and the  exercises only offer him temporary relief. The patient also reports that he is having some dizziness. He states this started after his psychiatrist started him on Restoril. He returns today for an evaluation.  REVIEW OF SYSTEMS: Out of a complete 14 system review of symptoms, the patient complains only of the following symptoms, and all other reviewed systems are negative.  Fatigue, ringing in ears, light sensitivity, loss of vision, abdominal pain, cold intolerance, joint pain, back pain, aching muscles, walking difficulty, neck pain, neck stiffness, agitation, behavior problem, decreased concentration, depression, nervous/anxious, weakness, tremors, headache, dizziness, memory loss  ALLERGIES: Allergies  Allergen Reactions  . Augmentin [Amoxicillin-Pot Clavulanate] Anaphylaxis    HOME MEDICATIONS: Outpatient Prescriptions Prior to Visit  Medication Sig Dispense Refill  . clonazePAM (KLONOPIN) 0.5 MG tablet Take 0.5 mg by mouth 2 (two) times daily as needed for anxiety.    . DULoxetine (CYMBALTA) 60 MG capsule Take 60 mg by mouth daily.    Marland Kitchen esomeprazole (NEXIUM) 40 MG capsule Take 40 mg by mouth daily at 12 noon.    . temazepam (RESTORIL) 15 MG capsule Take 15 mg by mouth at bedtime as needed for sleep.    . traMADol (ULTRAM) 50 MG tablet Take 50 mg by mouth 2 (two) times daily.     No facility-administered medications prior to visit.    PAST MEDICAL HISTORY: Past Medical History  Diagnosis Date  . Depression   . Scoliosis   .  HA (headache)   . Pain in soft tissues of limb   . Lumbago     PAST SURGICAL HISTORY: Past Surgical History  Procedure Laterality Date  . Cholecystectomy  2008  . Salivary gland surgery  1997    FAMILY HISTORY: Family History  Problem Relation Age of Onset  . Cancer Father   . Heart Problems Father     SOCIAL HISTORY: Social History   Social History  . Marital Status: Single    Spouse Name: N/A  . Number of Children: 1  . Years of  Education: GED   Occupational History  .      Disabled   Social History Main Topics  . Smoking status: Former Smoker -- 0.50 packs/day for 27 years    Types: Cigarettes  . Smokeless tobacco: Never Used     Comment: Quit 2014  . Alcohol Use: No  . Drug Use: No  . Sexual Activity: Not on file   Other Topics Concern  . Not on file   Social History Narrative   Patient lives at home with his mother.   Disabled.   Education GED.   Right handed.   Caffeine two cups of coffee daily.      PHYSICAL EXAM  Filed Vitals:   01/16/16 1403  BP: 114/75  Pulse: 85  Height: 6' 0.5" (1.842 m)  Weight: 208 lb (94.348 kg)   Body mass index is 27.81 kg/(m^2).  Generalized: Well developed, in no acute distress   Neurological examination  Mentation: Alert oriented to time, place, history taking. Follows all commands speech and language fluent Cranial nerve II-XII: Pupils were equal round reactive to light. Extraocular movements were full, visual field were full on confrontational test. Facial sensation and strength were normal. Uvula tongue midline. Head turning and shoulder shrug  were normal and symmetric. Motor: The motor testing reveals 5 over 5 strength of all 4 extremities. Good symmetric motor tone is noted throughout.  Sensory: Sensory testing is intact to soft touch on all 4 extremities. No evidence of extinction is noted.  Coordination: Cerebellar testing reveals good finger-nose-finger and heel-to-shin bilaterally.  Gait and station: Gait is normal. Tandem gait is normal. Romberg is negative. No drift is seen.  Reflexes: Deep tendon reflexes are symmetric and normal bilaterally.   DIAGNOSTIC DATA (LABS, IMAGING, TESTING) - I reviewed patient records, labs, notes, testing and imaging myself where available.     ASSESSMENT AND PLAN 45 y.o. year old male  has a past medical history of Depression; Scoliosis; HA (headache); Pain in soft tissues of limb; and Lumbago. here  with:  1. Chronic back pain 2. Scoliosis- mild  The patient continues to have ongoing back pain. In the past he's been referred to the pain clinic but had a hard time scheduling an appointment. I will put another referral in to the pain clinic. He will continue using Ultram provided by his primary care provider. I advised that he should avoid Vicodin since he had breathing difficulties. He will follow-up with our office on an as-needed basis.    Ward Givens, MSN, NP-C 01/16/2016, 1:59 PM [facility] [address], [place], [place] [address] [phone]

## 2016-01-16 NOTE — Progress Notes (Signed)
I have read the note, and I agree with the clinical assessment and plan.  Adam Holloway,Adam Holloway   

## 2016-01-16 NOTE — Patient Instructions (Signed)
Continue exercises. Referral to pain specialist.  If your symptoms worsen or you develop new symptoms please let us know.

## 2016-02-07 DIAGNOSIS — M4120 Other idiopathic scoliosis, site unspecified: Secondary | ICD-10-CM | POA: Diagnosis not present

## 2016-02-07 DIAGNOSIS — G5700 Lesion of sciatic nerve, unspecified lower limb: Secondary | ICD-10-CM | POA: Diagnosis not present

## 2016-02-07 DIAGNOSIS — E291 Testicular hypofunction: Secondary | ICD-10-CM | POA: Diagnosis not present

## 2016-02-13 DIAGNOSIS — Z79899 Other long term (current) drug therapy: Secondary | ICD-10-CM | POA: Diagnosis not present

## 2016-02-13 DIAGNOSIS — F331 Major depressive disorder, recurrent, moderate: Secondary | ICD-10-CM | POA: Diagnosis not present

## 2016-02-13 DIAGNOSIS — F4310 Post-traumatic stress disorder, unspecified: Secondary | ICD-10-CM | POA: Diagnosis not present

## 2016-04-14 DIAGNOSIS — M5412 Radiculopathy, cervical region: Secondary | ICD-10-CM | POA: Diagnosis not present

## 2016-04-14 DIAGNOSIS — G5601 Carpal tunnel syndrome, right upper limb: Secondary | ICD-10-CM | POA: Diagnosis not present

## 2016-04-22 ENCOUNTER — Ambulatory Visit: Payer: Self-pay | Admitting: General Surgery

## 2016-04-28 DIAGNOSIS — M654 Radial styloid tenosynovitis [de Quervain]: Secondary | ICD-10-CM | POA: Diagnosis not present

## 2016-05-08 DIAGNOSIS — K602 Anal fissure, unspecified: Secondary | ICD-10-CM | POA: Diagnosis not present

## 2016-05-08 DIAGNOSIS — G5601 Carpal tunnel syndrome, right upper limb: Secondary | ICD-10-CM | POA: Diagnosis not present

## 2016-05-08 DIAGNOSIS — M6240 Contracture of muscle, unspecified site: Secondary | ICD-10-CM | POA: Diagnosis not present

## 2016-05-08 DIAGNOSIS — K219 Gastro-esophageal reflux disease without esophagitis: Secondary | ICD-10-CM | POA: Diagnosis not present

## 2016-05-14 ENCOUNTER — Ambulatory Visit: Payer: Self-pay | Admitting: General Surgery

## 2016-05-26 ENCOUNTER — Encounter: Payer: Self-pay | Admitting: General Surgery

## 2016-05-26 ENCOUNTER — Ambulatory Visit (INDEPENDENT_AMBULATORY_CARE_PROVIDER_SITE_OTHER): Payer: Medicare Other | Admitting: General Surgery

## 2016-05-26 VITALS — BP 132/82 | HR 88 | Resp 14 | Ht 74.0 in | Wt 203.0 lb

## 2016-05-26 DIAGNOSIS — Z860100 Personal history of colon polyps, unspecified: Secondary | ICD-10-CM

## 2016-05-26 DIAGNOSIS — Z800 Family history of malignant neoplasm of digestive organs: Secondary | ICD-10-CM | POA: Diagnosis not present

## 2016-05-26 DIAGNOSIS — K6289 Other specified diseases of anus and rectum: Secondary | ICD-10-CM

## 2016-05-26 MED ORDER — POLYETHYLENE GLYCOL 3350 17 GM/SCOOP PO POWD: 1.0000 | Freq: Once | ORAL | 0 refills | Status: AC

## 2016-05-26 NOTE — Patient Instructions (Addendum)
Colonoscopy A colonoscopy is an exam to look at the entire large intestine (colon). This exam can help find problems such as tumors, polyps, inflammation, and areas of bleeding. The exam takes about 1 hour.  LET [facility] CARE PROVIDER KNOW ABOUT:   Any allergies you have.  All medicines you are taking, including vitamins, herbs, eye drops, creams, and over-the-counter medicines.  Previous problems you or members of your family have had with the use of anesthetics.  Any blood disorders you have.  Previous surgeries you have had.  Medical conditions you have. RISKS AND COMPLICATIONS  Generally, this is a safe procedure. However, as with any procedure, complications can occur. Possible complications include:  Bleeding.  Tearing or rupture of the colon wall.  Reaction to medicines given during the exam.  Infection (rare). BEFORE THE PROCEDURE   Ask your health care provider about changing or stopping your regular medicines.  You may be prescribed an oral bowel prep. This involves drinking a large amount of medicated liquid, starting the day before your procedure. The liquid will cause you to have multiple loose stools until your stool is almost clear or light green. This cleans out your colon in preparation for the procedure.  Do not eat or drink anything else once you have started the bowel prep, unless your health care provider tells you it is safe to do so.  Arrange for someone to drive you home after the procedure. PROCEDURE   You will be given medicine to help you relax (sedative).  You will lie on your side with your knees bent.  A long, flexible tube with a light and camera on the end (colonoscope) will be inserted through the rectum and into the colon. The camera sends video back to a computer screen as it moves through the colon. The colonoscope also releases carbon dioxide gas to inflate the colon. This helps your health care provider see the area better.  During  the exam, your health care provider may take a small tissue sample (biopsy) to be examined under a microscope if any abnormalities are found.  The exam is finished when the entire colon has been viewed. AFTER THE PROCEDURE   Do not drive for 24 hours after the exam.  You may have a small amount of blood in your stool.  You may pass moderate amounts of gas and have mild abdominal cramping or bloating. This is caused by the gas used to inflate your colon during the exam.  Ask when your test results will be ready and how you will get your results. Make sure you get your test results.   This information is not intended to replace advice given to you by your health care provider. Make sure you discuss any questions you have with your health care provider.   Document Released: 08/28/2000 Document Revised: 06/21/2013 Document Reviewed: 05/08/2013 Elsevier Interactive Patient Education [employer].  The patient is scheduled for a Colonoscopy at [facility] on 06/09/16. They are aware to call the day before to get their arrival time. Miralax prescription has been sent into the patient's pharmacy. The patient is aware of date and instructions.

## 2016-05-26 NOTE — Progress Notes (Signed)
Patient ID: Adam Holloway, male   DOB: 1971-07-06, 45 y.o.   MRN: [id number]  Chief Complaint  Patient presents with  . Rectal Problems    fissure    HPI Adam Holloway is a 45 y.o. male.  Here today for evaluation of an anal fissure. He states he is having some rectal pain and bleeding since about 5 months ago. He state sit seems to be worse some weeks more than others. Rectal bleeding and pain is not with every BM. Adam Holloway move often since he had his gall bladder out in 2008. He states the hydrocortisone suppository did help some. I have reviewed the history of present illness with the patient.   HPI  Past Medical History:  Diagnosis Date  . Depression   . HA (headache)   . Herniated disc, cervical   . Lumbago   . Pain in soft tissues of limb   . PTSD (post-traumatic stress disorder)   . Scoliosis     Past Surgical History:  Procedure Laterality Date  . CHOLECYSTECTOMY  2008  . COLONOSCOPY  2010  . SALIVARY GLAND SURGERY  1997    Family History  Problem Relation Age of Onset  . Cancer Father   . Heart Problems Father   . Lung cancer Father   . Colon cancer Mother     Social History Social History  Substance Use Topics  . Smoking status: Current Every Day Smoker    Packs/day: 1.00    Years: 32.00    Types: Cigarettes  . Smokeless tobacco: Never Used     Comment: Quit 2014  . Alcohol use No    Allergies  Allergen Reactions  . Augmentin [Amoxicillin-Pot Clavulanate] Anaphylaxis  . Norco [Hydrocodone-Acetaminophen] Shortness Of Breath    Current Outpatient Prescriptions  Medication Sig Dispense Refill  . aspirin EC 81 MG tablet Take 81 mg by mouth daily.    Marland Kitchen FLUoxetine (PROZAC) 20 MG capsule Take 20 mg by mouth daily.     Marland Kitchen HYDROcodone-acetaminophen (NORCO) 7.5-325 MG tablet Take 1 tablet by mouth 2 (two) times daily.  0  . hydrocortisone (ANUSOL-HC) 25 MG suppository USE TWICE A DAY AS DIRECTED  0  . prazosin (MINIPRESS) 2 MG capsule Take 2 mg by  mouth at bedtime.     . traMADol (ULTRAM) 50 MG tablet Take 50 mg by mouth 3 (three) times daily.      No current facility-administered medications for this visit.     Review of Systems Review of Systems  Constitutional: Negative.   Respiratory: Negative.   Cardiovascular: Negative.   Gastrointestinal: Positive for anal bleeding and rectal pain. Negative for constipation and diarrhea.    Blood pressure 132/82, pulse 88, resp. rate 14, height 6\' 2"  (1.88 m), weight 203 lb (92.1 kg).  Physical Exam Physical Exam  Constitutional: He is oriented to person, place, and time. He appears well-developed and well-nourished.  Eyes: Conjunctivae are normal. No scleral icterus.  Cardiovascular: Normal rate, regular rhythm and normal heart sounds.   Pulmonary/Chest: Effort normal and breath sounds normal.  Abdominal: Soft. Bowel sounds are normal. He exhibits no distension and no mass.  Genitourinary: Rectal exam shows external hemorrhoid, tenderness and anal tone abnormal.     Neurological: He is alert and oriented to person, place, and time.  Skin: Skin is warm and dry.  Psychiatric: His behavior is normal.    Data Reviewed Notes reviewed   Assessment    External hemorrhoid and suspected anal fissure. Pt  also has history of colon polyps-overdue for colonoscopy and his mother had colon cancer. Feel it is reasonable to do colonoscopy first. He has some nupercainol at home which he has not used . Advised to use it daily in the interim    Plan    Colonoscopy with possible biopsy/polypectomy prn: Information regarding the procedure, including its potential risks and complications (including but not limited to perforation of the bowel, which may require emergency surgery to repair, and bleeding) was verbally given to the patient. Educational information regarding lower intestinal endoscopy was given to the patient. Written instructions for how to complete the bowel prep using Miralax were  provided. The importance of drinking ample fluids to avoid dehydration as a result of the prep emphasized.    The patient is scheduled for a Colonoscopy at [facility] on 06/09/16. They are aware to call the day before to get their arrival time. Miralax prescription has been sent into the patient's pharmacy. The patient is aware of date and instructions.    This information has been scribed by [name].   Zan Orlick G 05/27/2016, 7:42 PM

## 2016-05-27 ENCOUNTER — Encounter: Payer: Self-pay | Admitting: General Surgery

## 2016-05-27 DIAGNOSIS — G5602 Carpal tunnel syndrome, left upper limb: Secondary | ICD-10-CM | POA: Diagnosis not present

## 2016-05-27 DIAGNOSIS — M778 Other enthesopathies, not elsewhere classified: Secondary | ICD-10-CM | POA: Diagnosis not present

## 2016-05-28 ENCOUNTER — Other Ambulatory Visit: Payer: Self-pay | Admitting: General Surgery

## 2016-06-09 ENCOUNTER — Ambulatory Visit
Admission: RE | Admit: 2016-06-09 | Discharge: 2016-06-09 | Disposition: A | Discharge status: Home / Self Care | Payer: Medicare Other | Source: Ambulatory Visit | Attending: General Surgery | Admitting: General Surgery

## 2016-06-09 ENCOUNTER — Ambulatory Visit: Payer: Medicare Other | Admitting: Anesthesiology

## 2016-06-09 ENCOUNTER — Encounter
Admission: RE | Disposition: A | Discharge status: Home / Self Care | Payer: Self-pay | Source: Ambulatory Visit | Attending: General Surgery

## 2016-06-09 DIAGNOSIS — F4310 Post-traumatic stress disorder, unspecified: Secondary | ICD-10-CM | POA: Insufficient documentation

## 2016-06-09 DIAGNOSIS — K602 Anal fissure, unspecified: Secondary | ICD-10-CM | POA: Insufficient documentation

## 2016-06-09 DIAGNOSIS — Z809 Family history of malignant neoplasm, unspecified: Secondary | ICD-10-CM | POA: Diagnosis not present

## 2016-06-09 DIAGNOSIS — K635 Polyp of colon: Secondary | ICD-10-CM | POA: Diagnosis not present

## 2016-06-09 DIAGNOSIS — Z9049 Acquired absence of other specified parts of digestive tract: Secondary | ICD-10-CM | POA: Insufficient documentation

## 2016-06-09 DIAGNOSIS — F329 Major depressive disorder, single episode, unspecified: Secondary | ICD-10-CM | POA: Insufficient documentation

## 2016-06-09 DIAGNOSIS — Z86010 Personal history of colonic polyps: Secondary | ICD-10-CM | POA: Diagnosis not present

## 2016-06-09 DIAGNOSIS — K625 Hemorrhage of anus and rectum: Secondary | ICD-10-CM | POA: Insufficient documentation

## 2016-06-09 DIAGNOSIS — Z801 Family history of malignant neoplasm of trachea, bronchus and lung: Secondary | ICD-10-CM | POA: Insufficient documentation

## 2016-06-09 DIAGNOSIS — Z880 Allergy status to penicillin: Secondary | ICD-10-CM | POA: Insufficient documentation

## 2016-06-09 DIAGNOSIS — Z7982 Long term (current) use of aspirin: Secondary | ICD-10-CM | POA: Diagnosis not present

## 2016-06-09 DIAGNOSIS — K644 Residual hemorrhoidal skin tags: Secondary | ICD-10-CM | POA: Insufficient documentation

## 2016-06-09 DIAGNOSIS — F17210 Nicotine dependence, cigarettes, uncomplicated: Secondary | ICD-10-CM | POA: Diagnosis not present

## 2016-06-09 DIAGNOSIS — D123 Benign neoplasm of transverse colon: Secondary | ICD-10-CM | POA: Diagnosis not present

## 2016-06-09 DIAGNOSIS — M5020 Other cervical disc displacement, unspecified cervical region: Secondary | ICD-10-CM | POA: Diagnosis not present

## 2016-06-09 DIAGNOSIS — Z800 Family history of malignant neoplasm of digestive organs: Secondary | ICD-10-CM | POA: Insufficient documentation

## 2016-06-09 DIAGNOSIS — Z885 Allergy status to narcotic agent status: Secondary | ICD-10-CM | POA: Diagnosis not present

## 2016-06-09 DIAGNOSIS — Z8249 Family history of ischemic heart disease and other diseases of the circulatory system: Secondary | ICD-10-CM | POA: Diagnosis not present

## 2016-06-09 DIAGNOSIS — M419 Scoliosis, unspecified: Secondary | ICD-10-CM | POA: Insufficient documentation

## 2016-06-09 DIAGNOSIS — D125 Benign neoplasm of sigmoid colon: Secondary | ICD-10-CM | POA: Insufficient documentation

## 2016-06-09 DIAGNOSIS — Z79899 Other long term (current) drug therapy: Secondary | ICD-10-CM | POA: Insufficient documentation

## 2016-06-09 DIAGNOSIS — Z1211 Encounter for screening for malignant neoplasm of colon: Secondary | ICD-10-CM | POA: Diagnosis not present

## 2016-06-09 HISTORY — PX: COLONOSCOPY WITH PROPOFOL: SHX5780

## 2016-06-09 SURGERY — COLONOSCOPY WITH PROPOFOL
Anesthesia: General

## 2016-06-09 MED ORDER — PHENYLEPHRINE 40 MCG/ML (10ML) SYRINGE FOR IV PUSH (FOR BLOOD PRESSURE SUPPORT): PREFILLED_SYRINGE | INTRAVENOUS | Status: DC | PRN

## 2016-06-09 MED ORDER — SODIUM CHLORIDE 0.9 % IV SOLN
INTRAVENOUS | Status: DC
  Administered 2016-06-09 (×2): via INTRAVENOUS

## 2016-06-09 MED ORDER — PROPOFOL 10 MG/ML IV BOLUS
INTRAVENOUS | Status: DC | PRN
  Administered 2016-06-09: 100 mg via INTRAVENOUS

## 2016-06-09 MED ORDER — MIDAZOLAM HCL 2 MG/2ML IJ SOLN
INTRAMUSCULAR | Status: DC | PRN
  Administered 2016-06-09: 1 mg via INTRAVENOUS

## 2016-06-09 MED ORDER — LIDOCAINE 2% (20 MG/ML) 5 ML SYRINGE
INTRAMUSCULAR | Status: DC | PRN
  Administered 2016-06-09: 40 mg via INTRAVENOUS

## 2016-06-09 MED ORDER — PHENYLEPHRINE HCL 10 MG/ML IJ SOLN
INTRAMUSCULAR | Status: DC | PRN
  Administered 2016-06-09 (×2): 100 ug via INTRAVENOUS

## 2016-06-09 MED ORDER — FENTANYL CITRATE (PF) 100 MCG/2ML IJ SOLN
INTRAMUSCULAR | Status: DC | PRN
  Administered 2016-06-09: 50 ug via INTRAVENOUS

## 2016-06-09 MED ORDER — PROPOFOL 500 MG/50ML IV EMUL
INTRAVENOUS | Status: DC | PRN
  Administered 2016-06-09: 180 ug/kg/min via INTRAVENOUS

## 2016-06-09 NOTE — Op Note (Signed)
[facility] Gastroenterology Patient Name: Adam Holloway Procedure Date: 06/09/2016 1:12 PM MRN: [id number] Account #: [id number] Date of Birth: Nov 02, 1970 Admit Type: Outpatient Age: 45 Room: [facility] ENDO ROOM 4 Gender: Male Note Status: Finalized Procedure:            Colonoscopy Indications:          High risk colon cancer surveillance: Personal history                        of colonic polyps, Family history of colon cancer in a                        first-degree relative Providers:            Seeplaputhur G. Jamal Collin, MD Referring MD:         [name], MD (Referring MD) Medicines:            General Anesthesia Complications:        No immediate complications. Procedure:            Pre-Anesthesia Assessment:                       - General anesthesia under the supervision of an                        anesthesiologist was determined to be medically                        necessary for this procedure based on review of the                        patient's medical history, medications, and prior                        anesthesia history.                       After obtaining informed consent, the colonoscope was                        passed under direct vision. Throughout the procedure,                        the patient's blood pressure, pulse, and oxygen                        saturations were monitored continuously. The                        Colonoscope was introduced through the anus and                        advanced to the the cecum, identified by the ileocecal                        valve. The colonoscopy was performed without                        difficulty. The patient tolerated the procedure well.  The quality of the bowel preparation was excellent. Findings:      The perianal exam findings include anal fissure and a skin tag.      A 5 mm polyp was found in the sigmoid colon. The polyp was       semi-pedunculated. The polyp  was removed with a hot snare. Resection and       retrieval were complete.      Two sessile polyps were found in the transverse colon. The polyps were 3       mm in size. These polyps were removed with a hot biopsy forceps.       Resection and retrieval were complete.      The exam was otherwise without abnormality on direct and retroflexion       views. Impression:           - Anal fissure and perianal skin tag found on perianal                        exam.                       - One 5 mm polyp in the sigmoid colon, removed with a                        hot snare. Resected and retrieved.                       - Two 3 mm polyps in the transverse colon, removed with                        a hot biopsy forceps. Resected and retrieved.                       - The examination was otherwise normal on direct and                        retroflexion views. Recommendation:       - Await pathology results. Procedure Code(s):    --- Professional ---                       [phone], Colonoscopy, flexible; with removal of tumor(s),                        polyp(s), or other lesion(s) by snare technique                       45384, 59, Colonoscopy, flexible; with removal of                        tumor(s), polyp(s), or other lesion(s) by hot biopsy                        forceps Diagnosis Code(s):    --- Professional ---                       Z86.010, Personal history of colonic polyps                       D12.5, Benign neoplasm of sigmoid colon  D12.3, Benign neoplasm of transverse colon (hepatic                        flexure or splenic flexure)                       K60.2, Anal fissure, unspecified                       K64.4, Residual hemorrhoidal skin tags                       Z80.0, Family history of malignant neoplasm of                        digestive organs CPT copyright 2016 American Medical Association. All rights reserved. The codes documented in this report are  preliminary and upon coder review may  be revised to meet current compliance requirements. Christene Lye, MD 06/09/2016 1:57:52 PM This report has been signed electronically. Number of Addenda: 0 Note Initiated On: 06/09/2016 1:12 PM Scope Withdrawal Time: 0 hours 12 minutes 55 seconds  Total Procedure Duration: 0 hours 30 minutes 5 seconds       [facility]

## 2016-06-09 NOTE — Interval H&P Note (Signed)
History and Physical Interval Note:  06/09/2016 1:05 PM  Adam Holloway  has presented today for surgery, with the diagnosis of PH COLON POLYP FAM COLON CA  The various methods of treatment have been discussed with the patient and family. After consideration of risks, benefits and other options for treatment, the patient has consented to  Procedure(s): COLONOSCOPY WITH PROPOFOL (N/A) as a surgical intervention .  The patient's history has been reviewed, patient examined, no change in status, stable for surgery.  I have reviewed the patient's chart and labs.  Questions were answered to the patient's satisfaction.     [name]

## 2016-06-09 NOTE — Transfer of Care (Signed)
Immediate Anesthesia Transfer of Care Note  Patient: Adam Holloway  Procedure(s) Performed: Procedure(s): COLONOSCOPY WITH PROPOFOL (N/A)  Patient Location: PACU and Endoscopy Unit  Anesthesia Type:General  Level of Consciousness: sedated  Airway & Oxygen Therapy: Patient Spontanous Breathing and Patient connected to nasal cannula oxygen  Post-op Assessment: Report given to RN and Post -op Vital signs reviewed and stable  Post vital signs: Reviewed and stable  Last Vitals:  Vitals:   06/09/16 1200  BP: 108/74  Pulse: 99  Resp: 16  Temp: 36.7 C    Last Pain:  Vitals:   06/09/16 1200  TempSrc: Tympanic         Complications: No apparent anesthesia complications

## 2016-06-09 NOTE — H&P (View-Only) (Signed)
Patient ID: Adam Holloway, male   DOB: [birth year], 45 y.o.   MRN: [id number]  Chief Complaint  Patient presents with  . Rectal Problems    fissure    HPI Adam Holloway is a 45 y.o. male.  Here today for evaluation of an anal fissure. He states he is having some rectal pain and bleeding since about 5 months ago. He state sit seems to be worse some weeks more than others. Rectal bleeding and pain is not with every BM. Adam Holloway move often since he had his gall bladder out in 2008. He states the hydrocortisone suppository did help some. I have reviewed the history of present illness with the patient.   HPI  Past Medical History:  Diagnosis Date  . Depression   . HA (headache)   . Herniated disc, cervical   . Lumbago   . Pain in soft tissues of limb   . PTSD (post-traumatic stress disorder)   . Scoliosis     Past Surgical History:  Procedure Laterality Date  . CHOLECYSTECTOMY  2008  . COLONOSCOPY  2010  . SALIVARY GLAND SURGERY  1997    Family History  Problem Relation Age of Onset  . Cancer Father   . Heart Problems Father   . Lung cancer Father   . Colon cancer Mother     Social History Social History  Substance Use Topics  . Smoking status: Current Every Day Smoker    Packs/day: 1.00    Years: 32.00    Types: Cigarettes  . Smokeless tobacco: Never Used     Comment: Quit 2014  . Alcohol use No    Allergies  Allergen Reactions  . Augmentin [Amoxicillin-Pot Clavulanate] Anaphylaxis  . Norco [Hydrocodone-Acetaminophen] Shortness Of Breath    Current Outpatient Prescriptions  Medication Sig Dispense Refill  . aspirin EC 81 MG tablet Take 81 mg by mouth daily.    Marland Kitchen FLUoxetine (PROZAC) 20 MG capsule Take 20 mg by mouth daily.     Marland Kitchen HYDROcodone-acetaminophen (NORCO) 7.5-325 MG tablet Take 1 tablet by mouth 2 (two) times daily.  0  . hydrocortisone (ANUSOL-HC) 25 MG suppository USE TWICE A DAY AS DIRECTED  0  . prazosin (MINIPRESS) 2 MG capsule Take 2 mg by  mouth at bedtime.     . traMADol (ULTRAM) 50 MG tablet Take 50 mg by mouth 3 (three) times daily.      No current facility-administered medications for this visit.     Review of Systems Review of Systems  Constitutional: Negative.   Respiratory: Negative.   Cardiovascular: Negative.   Gastrointestinal: Positive for anal bleeding and rectal pain. Negative for constipation and diarrhea.    Blood pressure 132/82, pulse 88, resp. rate 14, height 6\' 2"  (1.88 m), weight 203 lb (92.1 kg).  Physical Exam Physical Exam  Constitutional: He is oriented to person, place, and time. He appears well-developed and well-nourished.  Eyes: Conjunctivae are normal. No scleral icterus.  Cardiovascular: Normal rate, regular rhythm and normal heart sounds.   Pulmonary/Chest: Effort normal and breath sounds normal.  Abdominal: Soft. Bowel sounds are normal. He exhibits no distension and no mass.  Genitourinary: Rectal exam shows external hemorrhoid, tenderness and anal tone abnormal.     Neurological: He is alert and oriented to person, place, and time.  Skin: Skin is warm and dry.  Psychiatric: His behavior is normal.    Data Reviewed Notes reviewed   Assessment    External hemorrhoid and suspected anal fissure. Pt  also has history of colon polyps-overdue for colonoscopy and his mother had colon cancer. Feel it is reasonable to do colonoscopy first. He has some nupercainol at home which he has not used . Advised to use it daily in the interim    Plan    Colonoscopy with possible biopsy/polypectomy prn: Information regarding the procedure, including its potential risks and complications (including but not limited to perforation of the bowel, which may require emergency surgery to repair, and bleeding) was verbally given to the patient. Educational information regarding lower intestinal endoscopy was given to the patient. Written instructions for how to complete the bowel prep using Miralax were  provided. The importance of drinking ample fluids to avoid dehydration as a result of the prep emphasized.    The patient is scheduled for a Colonoscopy at [facility] on 06/09/16. They are aware to call the day before to get their arrival time. Miralax prescription has been sent into the patient's pharmacy. The patient is aware of date and instructions.    This information has been scribed by [name].   Mosetta Ferdinand G 05/27/2016, 7:42 PM

## 2016-06-09 NOTE — Anesthesia Preprocedure Evaluation (Signed)
Anesthesia Evaluation  Patient identified by MRN, date of birth, ID band Patient awake    Reviewed: Allergy & Precautions, NPO status , Patient's Chart, lab work & pertinent test results  Airway Mallampati: III       Dental  (+) Teeth Intact   Pulmonary neg pulmonary ROS, Current Smoker,    breath sounds clear to auscultation       Cardiovascular Exercise Tolerance: Good  Rhythm:Regular     Neuro/Psych  Headaches, Depression    GI/Hepatic negative GI ROS, Neg liver ROS,   Endo/Other  negative endocrine ROS  Renal/GU negative Renal ROS     Musculoskeletal   Abdominal Normal abdominal exam  (+)   Peds  Hematology negative hematology ROS (+)   Anesthesia Other Findings   Reproductive/Obstetrics                             Anesthesia Physical Anesthesia Plan  ASA: II  Anesthesia Plan: General   Post-op Pain Management:    Induction: Intravenous  Airway Management Planned: Natural Airway and Nasal Cannula  Additional Equipment:   Intra-op Plan:   Post-operative Plan:   Informed Consent: I have reviewed the patients History and Physical, chart, labs and discussed the procedure including the risks, benefits and alternatives for the proposed anesthesia with the patient or authorized representative who has indicated his/her understanding and acceptance.     Plan Discussed with: CRNA  Anesthesia Plan Comments:         Anesthesia Quick Evaluation

## 2016-06-09 NOTE — Anesthesia Postprocedure Evaluation (Signed)
Anesthesia Post Note  Patient: Adam Holloway  Procedure(s) Performed: Procedure(s) (LRB): COLONOSCOPY WITH PROPOFOL (N/A)  Patient location during evaluation: PACU Anesthesia Type: General Level of consciousness: awake Pain management: pain level controlled Vital Signs Assessment: post-procedure vital signs reviewed and stable Respiratory status: spontaneous breathing Cardiovascular status: stable Anesthetic complications: no    Last Vitals:  Vitals:   06/09/16 1358 06/09/16 1408  BP: 90/63 (!) 117/93  Pulse: 73 76  Resp: 16 11  Temp: (!) 35.7 C     Last Pain:  Vitals:   06/09/16 1358  TempSrc: Tympanic                 VAN STAVEREN,Brinsley Wence

## 2016-06-10 ENCOUNTER — Encounter: Payer: Self-pay | Admitting: General Surgery

## 2016-06-10 ENCOUNTER — Telehealth: Payer: Self-pay

## 2016-06-10 ENCOUNTER — Telehealth: Payer: Self-pay | Admitting: *Deleted

## 2016-06-10 LAB — SURGICAL PATHOLOGY

## 2016-06-10 NOTE — Telephone Encounter (Signed)
Call to patient to see about scheduling surgery. The patient is scheduled for surgery at [facility] on 06/29/16. He will pre admit by phone. The patient is aware of date and instructions. Surgery instructions have been mailed to the patient.

## 2016-06-10 NOTE — Telephone Encounter (Signed)
-----   Message from Christene Lye, MD sent at 06/10/2016  4:29 PM EDT ----- Rosann Auerbach, please let pt pt know the pathology was normal.

## 2016-06-11 DIAGNOSIS — D125 Benign neoplasm of sigmoid colon: Secondary | ICD-10-CM

## 2016-06-11 DIAGNOSIS — Z800 Family history of malignant neoplasm of digestive organs: Secondary | ICD-10-CM

## 2016-06-11 DIAGNOSIS — K602 Anal fissure, unspecified: Secondary | ICD-10-CM

## 2016-06-11 DIAGNOSIS — D123 Benign neoplasm of transverse colon: Secondary | ICD-10-CM

## 2016-06-11 DIAGNOSIS — Z86010 Personal history of colonic polyps: Secondary | ICD-10-CM

## 2016-06-11 NOTE — Telephone Encounter (Signed)
Patient notified as instructed, verbalized understanding. 

## 2016-06-16 NOTE — Telephone Encounter (Signed)
Notified patient as instructed, patient pleased. Discussed follow-up appointments, patient agrees  

## 2016-06-22 ENCOUNTER — Encounter
Admit: 2016-06-22 | Discharge: 2016-06-22 | Disposition: A | Discharge status: Home / Self Care | Payer: Medicare Other | Attending: General Surgery | Admitting: General Surgery

## 2016-06-22 ENCOUNTER — Other Ambulatory Visit: Payer: Self-pay | Admitting: General Surgery

## 2016-06-22 DIAGNOSIS — K644 Residual hemorrhoidal skin tags: Secondary | ICD-10-CM

## 2016-06-22 DIAGNOSIS — K602 Anal fissure, unspecified: Secondary | ICD-10-CM

## 2016-06-22 HISTORY — DX: Other complications of anesthesia, initial encounter: T88.59XA

## 2016-06-22 HISTORY — DX: Personal history of urinary calculi: Z87.442

## 2016-06-22 HISTORY — DX: Family history of other specified conditions: Z84.89

## 2016-06-22 HISTORY — DX: Unspecified osteoarthritis, unspecified site: M19.90

## 2016-06-22 HISTORY — DX: Gastro-esophageal reflux disease without esophagitis: K21.9

## 2016-06-22 HISTORY — DX: Adverse effect of unspecified anesthetic, initial encounter: T41.45XA

## 2016-06-22 NOTE — Patient Instructions (Addendum)
  Your procedure is scheduled on: 06-29-16 [facility]) Report to Same Day Surgery 2nd floor medical mall To find out your arrival time please call [phone] between [place] on 06-26-16 (FRIDAY)  Remember: Instructions that are not followed completely may result in serious medical risk, up to and including death, or upon the discretion of your surgeon and anesthesiologist your surgery may need to be rescheduled.    _x___ 1. Do not eat food or drink liquids after midnight. No gum chewing or hard candies.     __x__ 2. No Alcohol for 24 hours before or after surgery.   __x__3. No Smoking for 24 prior to surgery.   ____  4. Bring all medications with you on the day of surgery if instructed.    __x__ 5. Notify your doctor if there is any change in your medical condition     (cold, fever, infections).     Do not wear jewelry, make-up, hairpins, clips or nail polish.  Do not wear lotions, powders, or perfumes. You may wear deodorant.  Do not shave 48 hours prior to surgery. Men may shave face and neck.  Do not bring valuables to the hospital.    [facility] is not responsible for any belongings or valuables.               Contacts, dentures or bridgework may not be worn into surgery.  Leave your suitcase in the car. After surgery it may be brought to your room.  For patients admitted to the hospital, discharge time is determined by your treatment team.   Patients discharged the day of surgery will not be allowed to drive home.    Please read over the following fact sheets that you were given:   [facility] Preparing for Surgery and or MRSA Information   _x___ Take these medicines the morning of surgery with A SIP OF WATER:    1. PROZAC  2.  3.  4.  5.  6.  _X___Fleets enema or Magnesium Citrate as directed-FLEETS ENEMA AT HOME 1 HOUR PRIOR TO [place]   _x___ Use CHG Soap or sage wipes as directed on instruction sheet   ____ Use inhalers on the day of surgery  and bring to hospital day of surgery  ____ Stop metformin 2 days prior to surgery    ____ Take 1/2 of usual insulin dose the night before surgery and none on the morning of  surgery.   ____ Stop aspirin or coumadin, or plavix  x__ Stop Anti-inflammatories such as Advil, Aleve, Ibuprofen, Motrin, Naproxen,          Naprosyn, Goodies powders or aspirin products. Ok to take Tylenol OR TRAMADOL   ____ Stop supplements until after surgery.    ____ Bring C-Pap to the hospital.

## 2016-06-29 ENCOUNTER — Ambulatory Visit: Payer: Medicare Other | Admitting: Certified Registered Nurse Anesthetist

## 2016-06-29 ENCOUNTER — Encounter: Payer: Self-pay | Admitting: *Deleted

## 2016-06-29 ENCOUNTER — Encounter
Admission: RE | Disposition: A | Discharge status: Home / Self Care | Payer: Self-pay | Source: Ambulatory Visit | Attending: General Surgery

## 2016-06-29 ENCOUNTER — Ambulatory Visit
Admission: RE | Admit: 2016-06-29 | Discharge: 2016-06-29 | Disposition: A | Discharge status: Home / Self Care | Payer: Medicare Other | Source: Ambulatory Visit | Attending: General Surgery | Admitting: General Surgery

## 2016-06-29 DIAGNOSIS — Z880 Allergy status to penicillin: Secondary | ICD-10-CM | POA: Insufficient documentation

## 2016-06-29 DIAGNOSIS — K601 Chronic anal fissure: Secondary | ICD-10-CM | POA: Diagnosis not present

## 2016-06-29 DIAGNOSIS — M1990 Unspecified osteoarthritis, unspecified site: Secondary | ICD-10-CM | POA: Insufficient documentation

## 2016-06-29 DIAGNOSIS — K219 Gastro-esophageal reflux disease without esophagitis: Secondary | ICD-10-CM | POA: Insufficient documentation

## 2016-06-29 DIAGNOSIS — F17210 Nicotine dependence, cigarettes, uncomplicated: Secondary | ICD-10-CM | POA: Diagnosis not present

## 2016-06-29 DIAGNOSIS — K602 Anal fissure, unspecified: Secondary | ICD-10-CM | POA: Diagnosis not present

## 2016-06-29 DIAGNOSIS — Z800 Family history of malignant neoplasm of digestive organs: Secondary | ICD-10-CM | POA: Insufficient documentation

## 2016-06-29 DIAGNOSIS — Z7982 Long term (current) use of aspirin: Secondary | ICD-10-CM | POA: Insufficient documentation

## 2016-06-29 DIAGNOSIS — K644 Residual hemorrhoidal skin tags: Secondary | ICD-10-CM

## 2016-06-29 DIAGNOSIS — F4310 Post-traumatic stress disorder, unspecified: Secondary | ICD-10-CM | POA: Insufficient documentation

## 2016-06-29 DIAGNOSIS — K648 Other hemorrhoids: Secondary | ICD-10-CM | POA: Diagnosis not present

## 2016-06-29 HISTORY — PX: SPHINCTEROTOMY: SHX5279

## 2016-06-29 HISTORY — PX: HEMORRHOID SURGERY: SHX153

## 2016-06-29 HISTORY — PX: FISSURECTOMY: SHX5244

## 2016-06-29 SURGERY — HEMORRHOIDECTOMY
Anesthesia: General | Wound class: Clean Contaminated

## 2016-06-29 MED ORDER — LACTATED RINGERS IV SOLN
INTRAVENOUS | Status: DC
  Administered 2016-06-29: 07:00:00 via INTRAVENOUS

## 2016-06-29 MED ORDER — ROCURONIUM BROMIDE 100 MG/10ML IV SOLN
INTRAVENOUS | Status: DC | PRN
  Administered 2016-06-29: 50 mg via INTRAVENOUS

## 2016-06-29 MED ORDER — FENTANYL CITRATE (PF) 100 MCG/2ML IJ SOLN
25.0000 ug | INTRAMUSCULAR | Status: AC | PRN
  Administered 2016-06-29 (×2): 25 ug via INTRAVENOUS

## 2016-06-29 MED ORDER — BACITRACIN ZINC 500 UNIT/GM EX OINT
TOPICAL_OINTMENT | CUTANEOUS | Status: AC
  Filled 2016-06-29: qty 28.35

## 2016-06-29 MED ORDER — BUPIVACAINE HCL (PF) 0.5 % IJ SOLN
INTRAMUSCULAR | Status: AC
  Filled 2016-06-29: qty 30

## 2016-06-29 MED ORDER — BACITRACIN ZINC 500 UNIT/GM EX OINT
TOPICAL_OINTMENT | CUTANEOUS | Status: DC | PRN
  Administered 2016-06-29: 1 via TOPICAL

## 2016-06-29 MED ORDER — PROPOFOL 10 MG/ML IV BOLUS
INTRAVENOUS | Status: DC | PRN
  Administered 2016-06-29: 200 mg via INTRAVENOUS

## 2016-06-29 MED ORDER — FENTANYL CITRATE (PF) 100 MCG/2ML IJ SOLN
INTRAMUSCULAR | Status: DC | PRN
  Administered 2016-06-29: 50 ug via INTRAVENOUS
  Administered 2016-06-29: 100 ug via INTRAVENOUS
  Administered 2016-06-29: 50 ug via INTRAVENOUS

## 2016-06-29 MED ORDER — SODIUM CHLORIDE 0.9 % IJ SOLN
INTRAMUSCULAR | Status: AC
  Filled 2016-06-29: qty 50

## 2016-06-29 MED ORDER — LACTATED RINGERS IV SOLN
INTRAVENOUS | Status: DC | PRN
  Administered 2016-06-29: 07:00:00 via INTRAVENOUS

## 2016-06-29 MED ORDER — FAMOTIDINE 20 MG PO TABS
20.0000 mg | ORAL_TABLET | Freq: Once | ORAL | Status: AC
  Administered 2016-06-29: 20 mg via ORAL

## 2016-06-29 MED ORDER — TRAMADOL HCL 50 MG PO TABS
ORAL_TABLET | ORAL | Status: AC
  Filled 2016-06-29: qty 1

## 2016-06-29 MED ORDER — LIDOCAINE HCL (PF) 1 % IJ SOLN
INTRAMUSCULAR | Status: AC
  Filled 2016-06-29: qty 30

## 2016-06-29 MED ORDER — MIDAZOLAM HCL 2 MG/2ML IJ SOLN
INTRAMUSCULAR | Status: DC | PRN
  Administered 2016-06-29: 2 mg via INTRAVENOUS

## 2016-06-29 MED ORDER — TRAMADOL HCL 50 MG PO TABS: 50.0000 mg | ORAL_TABLET | Freq: Four times a day (QID) | ORAL | 0 refills | Status: DC | PRN

## 2016-06-29 MED ORDER — FENTANYL CITRATE (PF) 100 MCG/2ML IJ SOLN
INTRAMUSCULAR | Status: AC
  Administered 2016-06-29: 25 ug via INTRAVENOUS
  Filled 2016-06-29: qty 2

## 2016-06-29 MED ORDER — FAMOTIDINE 20 MG PO TABS
ORAL_TABLET | ORAL | Status: AC
  Administered 2016-06-29: 20 mg via ORAL
  Filled 2016-06-29: qty 1

## 2016-06-29 MED ORDER — ONDANSETRON HCL 4 MG/2ML IJ SOLN: 4.0000 mg | Freq: Once | INTRAMUSCULAR | Status: DC | PRN

## 2016-06-29 MED ORDER — DEXAMETHASONE SODIUM PHOSPHATE 10 MG/ML IJ SOLN
INTRAMUSCULAR | Status: DC | PRN
  Administered 2016-06-29: 10 mg via INTRAVENOUS

## 2016-06-29 MED ORDER — BUPIVACAINE LIPOSOME 1.3 % IJ SUSP
INTRAMUSCULAR | Status: AC
  Filled 2016-06-29: qty 20

## 2016-06-29 MED ORDER — FENTANYL CITRATE (PF) 100 MCG/2ML IJ SOLN
25.0000 ug | INTRAMUSCULAR | Status: AC | PRN
  Administered 2016-06-29 (×6): 25 ug via INTRAVENOUS

## 2016-06-29 MED ORDER — ONDANSETRON HCL 4 MG/2ML IJ SOLN
INTRAMUSCULAR | Status: DC | PRN
  Administered 2016-06-29: 4 mg via INTRAVENOUS

## 2016-06-29 MED ORDER — SUGAMMADEX SODIUM 200 MG/2ML IV SOLN
INTRAVENOUS | Status: DC | PRN
  Administered 2016-06-29: 200 mg via INTRAVENOUS

## 2016-06-29 MED ORDER — FLEET ENEMA 7-19 GM/118ML RE ENEM: 1.0000 | ENEMA | Freq: Once | RECTAL | Status: DC

## 2016-06-29 MED ORDER — LIDOCAINE HCL (CARDIAC) 20 MG/ML IV SOLN
INTRAVENOUS | Status: DC | PRN
  Administered 2016-06-29: 50 mg via INTRAVENOUS

## 2016-06-29 MED ORDER — SODIUM CHLORIDE 0.9 % IV SOLN
INTRAVENOUS | Status: DC | PRN
  Administered 2016-06-29: 30 mL

## 2016-06-29 MED ORDER — TRAMADOL HCL 50 MG PO TABS
50.0000 mg | ORAL_TABLET | Freq: Once | ORAL | Status: AC
  Administered 2016-06-29: 50 mg via ORAL

## 2016-06-29 SURGICAL SUPPLY — 28 items
BLADE SURG 15 STRL SS SAFETY (BLADE) ×3 IMPLANT
BRIEF STRETCH MATERNITY 2XLG (MISCELLANEOUS) ×3 IMPLANT
CANISTER SUCT 1200ML W/VALVE (MISCELLANEOUS) ×3 IMPLANT
DRAPE LAPAROTOMY 77X122 PED (DRAPES) ×3 IMPLANT
DRAPE LEGGINS SURG 28X43 STRL (DRAPES) ×3 IMPLANT
DRAPE UNDER BUTTOCK W/FLU (DRAPES) ×3 IMPLANT
ELECT REM PT RETURN 9FT ADLT (ELECTROSURGICAL) ×3
ELECTRODE REM PT RTRN 9FT ADLT (ELECTROSURGICAL) ×1 IMPLANT
GLOVE BIO SURGEON STRL SZ7 (GLOVE) ×18 IMPLANT
GOWN STRL REUS W/ TWL LRG LVL3 (GOWN DISPOSABLE) ×3 IMPLANT
GOWN STRL REUS W/TWL LRG LVL3 (GOWN DISPOSABLE) ×6
KIT RM TURNOVER CYSTO AR (KITS) ×3 IMPLANT
KIT RM TURNOVER STRD PROC AR (KITS) ×3 IMPLANT
LABEL OR SOLS (LABEL) IMPLANT
LIGASURE IMPACT 36 18CM CVD LR (INSTRUMENTS) ×3 IMPLANT
NEEDLE HYPO 25X1 1.5 SAFETY (NEEDLE) ×3 IMPLANT
NS IRRIG 500ML POUR BTL (IV SOLUTION) ×3 IMPLANT
PACK BASIN MINOR ARMC (MISCELLANEOUS) ×3 IMPLANT
PAD OB MATERNITY 4.3X12.25 (PERSONAL CARE ITEMS) ×3 IMPLANT
PAD PREP 24X41 OB/GYN DISP (PERSONAL CARE ITEMS) ×3 IMPLANT
SOL PREP PVP 2OZ (MISCELLANEOUS) ×3
SOLUTION PREP PVP 2OZ (MISCELLANEOUS) ×1 IMPLANT
SURGILUBE 2OZ TUBE FLIPTOP (MISCELLANEOUS) ×3 IMPLANT
SUT MNCRL 3-0 UNDYED SH (SUTURE) ×1 IMPLANT
SUT MONOCRYL 3-0 UNDYED (SUTURE) ×2
SUT VIC AB 3-0 SH 27 (SUTURE) ×2
SUT VIC AB 3-0 SH 27X BRD (SUTURE) ×1 IMPLANT
SYR CONTROL 10ML (SYRINGE) ×3 IMPLANT

## 2016-06-29 NOTE — Anesthesia Procedure Notes (Signed)
Performed by: Benigno Check       

## 2016-06-29 NOTE — Interval H&P Note (Signed)
History and Physical Interval Note:  06/29/2016 7:13 AM  Adam Holloway  has presented today for surgery, with the diagnosis of anal fissure,external hemorrhoid  The various methods of treatment have been discussed with the patient and family. After consideration of risks, benefits and other options for treatment, the patient has consented to  Procedure(s): HEMORRHOIDECTOMY (N/A) FISSURECTOMY (N/A) SPHINCTEROTOMY (N/A) as a surgical intervention .  The patient's history has been reviewed, patient examined, no change in status, stable for surgery.  I have reviewed the patient's chart and labs.  Questions were answered to the patient's satisfaction.     Temisha Murley G

## 2016-06-29 NOTE — Anesthesia Preprocedure Evaluation (Addendum)
Anesthesia Evaluation  Patient identified by MRN, date of birth, ID band Patient awake    Reviewed: Allergy & Precautions, NPO status , Patient's Chart, lab work & pertinent test results  History of Anesthesia Complications (+) Family history of anesthesia reaction  Airway Mallampati: III  TM Distance: >3 FB    Comment: overhang Dental  (+) Teeth Intact   Pulmonary neg pulmonary ROS, Current Smoker,    breath sounds clear to auscultation       Cardiovascular Exercise Tolerance: Good  Rhythm:Regular     Neuro/Psych  Headaches, Depression    GI/Hepatic negative GI ROS, Neg liver ROS, GERD  Medicated,  Endo/Other  negative endocrine ROS  Renal/GU negative Renal ROS  negative genitourinary   Musculoskeletal  (+) Arthritis , Osteoarthritis,    Abdominal Normal abdominal exam  (+)   Peds  Hematology negative hematology ROS (+)   Anesthesia Other Findings scoliosis  Reproductive/Obstetrics                             Anesthesia Physical  Anesthesia Plan  ASA: II  Anesthesia Plan: General   Post-op Pain Management:    Induction: Intravenous  Airway Management Planned: Oral ETT  Additional Equipment:   Intra-op Plan:   Post-operative Plan: Extubation in OR  Informed Consent: I have reviewed the patients History and Physical, chart, labs and discussed the procedure including the risks, benefits and alternatives for the proposed anesthesia with the patient or authorized representative who has indicated his/her understanding and acceptance.     Plan Discussed with: CRNA  Anesthesia Plan Comments:         Anesthesia Quick Evaluation

## 2016-06-29 NOTE — Discharge Instructions (Addendum)
Anal Fissure, Adult An anal fissure is a small tear or crack in the skin around the anus. Bleeding from a fissure usually stops on its own within a few minutes. However, bleeding will often occur again with each bowel movement until the crack heals. CAUSES This condition may be caused by:  Passing large, hard stool (feces).  Frequent diarrhea.  Constipation.  Inflammatory bowel disease (Crohn disease or ulcerative colitis).  Infections.  Anal sex. SYMPTOMS Symptoms of this condition include:  Bleeding from the rectum.  Small amounts of blood seen on your stool, on toilet paper, or in the toilet after a bowel movement.  Painful bowel movements.  Itching or irritation around the anus. DIAGNOSIS A health care provider may diagnose this condition by closely examining the anal area. An anal fissure can usually be seen with careful inspection. In some cases, a rectal exam may be performed, or a short tube (anoscope) may be used to examine the anal canal. TREATMENT Treatment for this condition may include:  Taking steps to avoid constipation. This may include making changes to your diet, such as increasing your intake of fiber or fluid.  Taking fiber supplements. These supplements can soften your stool to help make bowel movements easier. Your health care provider may also prescribe a stool softener if your stool is often hard.  Taking sitz baths. This may help to heal the tear.  Using medicated creams or ointments. These may be prescribed to lessen discomfort. HOME CARE INSTRUCTIONS Eating and Drinking  Avoid foods that may be constipating, such as bananas and dairy products.  Drink enough fluid to keep your urine clear or pale yellow.  Maintain a diet that is high in fruits, whole grains, and vegetables. General Instructions  Keep the anal area as clean and dry as possible.  Take sitz baths as told by your health care provider. Do not use soap in the sitz  baths.  Take over-the-counter and prescription medicines only as told by your health care provider.  Use creams or ointments only as told by your health care provider.  Keep all follow-up visits as told by your health care provider. This is important. SEEK MEDICAL CARE IF:  You have more bleeding.  You have a fever.  You have diarrhea that is mixed with blood.  You continue to have pain.  Your problem is getting worse rather than better.   This information is not intended to replace advice given to you by your health care provider. Make sure you discuss any questions you have with your health care provider.   Document Released: 08/31/2005 Document Revised: 05/22/2015 Document Reviewed: 11/26/2014 Elsevier Interactive Patient Education 2016 [place]   1) The drugs that you were given will stay in your system until tomorrow so for the next 24 hours you should not:  A) Drive an automobile B) Make any legal decisions C) Drink any alcoholic beverage   2) You may resume regular meals tomorrow.  Today it is better to start with liquids and gradually work up to solid foods.  You may eat anything you prefer, but it is better to start with liquids, then soup and crackers, and gradually work up to solid foods.   3) Please notify your doctor immediately if you have any unusual bleeding, trouble breathing, redness and pain at the surgery site, drainage, fever, or pain not relieved by medication.    4) Additional Instructions:        Please  contact your physician with any problems or Same Day Surgery at [phone], Monday through Friday 6 am to 4 pm, or [place] at [facility] number at [phone].

## 2016-06-29 NOTE — Anesthesia Procedure Notes (Signed)
Procedure Name: Intubation Date/Time: 06/29/2016 8:34 AM Performed by: Darlyne Russian Pre-anesthesia Checklist: Patient identified, Emergency Drugs available, Suction available and Patient being monitored Patient Re-evaluated:Patient Re-evaluated prior to inductionOxygen Delivery Method: Circle system utilized Preoxygenation: Pre-oxygenation with 100% oxygen Intubation Type: IV induction Ventilation: Mask ventilation without difficulty Laryngoscope Size: Mac and 4 Grade View: Grade III Tube type: Oral Tube size: 7.5 mm Number of attempts: 1 Airway Equipment and Method: Stylet Placement Confirmation: ETT inserted through vocal cords under direct vision,  positive ETCO2 and breath sounds checked- equal and bilateral Secured at: 23 cm Tube secured with: Tape Dental Injury: Teeth and Oropharynx as per pre-operative assessment

## 2016-06-29 NOTE — Transfer of Care (Signed)
Immediate Anesthesia Transfer of Care Note  Patient: Adam Holloway  Procedure(s) Performed: Procedure(s): HEMORRHOIDECTOMY (N/A) FISSURECTOMY (N/A) SPHINCTEROTOMY (N/A)  Patient Location: PACU  Anesthesia Type:General  Level of Consciousness: awake, alert , oriented and patient cooperative  Airway & Oxygen Therapy: Patient Spontanous Breathing and Patient connected to nasal cannula oxygen  Post-op Assessment: Report given to RN, Post -op Vital signs reviewed and stable and Patient moving all extremities X 4  Post vital signs: Reviewed and stable  Last Vitals:  Vitals:   06/29/16 0618 06/29/16 0818  BP: 134/87 127/87  Pulse: 94 98  Resp: 16 17  Temp: 36.6 C 36.1 C    Last Pain:  Vitals:   06/29/16 0818  TempSrc:   PainSc: 5          Complications: No apparent anesthesia complications

## 2016-06-29 NOTE — Op Note (Signed)
Preop diagnosis: Anal fissure and external hemorrhoid  Post op diagnosis: Same  Operation: External hemorrhoidectomy with anal sphincterotomy  Surgeon: Mckinley Jewel  Assistant:     Anesthesia: Gen.  Complications: None  EBL: Minimal  Drains: None  Description: Patient was put to sleep and then placed in lithotomy position on the operating table. The anal rectal area was prepped and draped as sterile field and timeout performed. 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 External hemorrhoidectomy was performed first. The skin around the base of the hemorrhoid was incised and then the LigaSure device was used to seal and cut this hemorrhoid. Following this the at the 3 and 9:00 positions small radial incisions were made and the internal sphincter muscle was lifted up at the age and cut with cautery. The opening was closed with 3-0 Vicryl. 20 mLExparel mixed with 10 mL of saline was then injected into the subcutaneous tissue surrounding the anal opening and also into the intersphincteric grooves in the 4 quadrants. The area was then covered with bacitracin ointment and pad and mesh underwear placed. Patient subsequently was extubated and returned recovery room stable condition

## 2016-06-29 NOTE — H&P (Signed)
Adam Holloway is an 45 y.o. male.   Chief Complaint: here for planned surgery HPI: Pt presented with c/o pain in rectal area for several mos. Some bleeding. OTC preparations have not helped. Has FH of colon cancer. Colonoscopy completed 2 weeks ago-he has an anl fissure with fibrotic external hemorrhoid  Past Medical History:  Diagnosis Date  . Arthritis    back, left wrist, left knee  . Complication of anesthesia    bp dropped during gallbladder surgery in 2008  . Depression   . Family history of adverse reaction to anesthesia    mom stopped breathing during colonscopy  . GERD (gastroesophageal reflux disease)    h/o  . HA (headache)    h/o migrianes  . Herniated disc, cervical   . History of kidney stones    h/o stones  . Lumbago   . Pain in soft tissues of limb   . PTSD (post-traumatic stress disorder)   . Scoliosis     Past Surgical History:  Procedure Laterality Date  . CHOLECYSTECTOMY  2008  . COLONOSCOPY  2010  . COLONOSCOPY WITH PROPOFOL N/A 06/09/2016   Procedure: COLONOSCOPY WITH PROPOFOL;  Surgeon: Christene Lye, MD;  Location: [facility] ENDOSCOPY;  Service: Endoscopy;  Laterality: N/A;  . SALIVARY GLAND SURGERY  1997    Family History  Problem Relation Age of Onset  . Cancer Father   . Heart Problems Father   . Lung cancer Father   . Colon cancer Mother    Social History:  reports that he has been smoking Cigarettes.  He has a 32.00 pack-year smoking history. He has quit using smokeless tobacco. He reports that he does not drink alcohol or use drugs.  Allergies:  Allergies  Allergen Reactions  . Augmentin [Amoxicillin-Pot Clavulanate] Anaphylaxis  . Nsaids Other (See Comments)    Chest pain    Medications Prior to Admission  Medication Sig Dispense Refill  . aspirin EC 81 MG tablet Take 81 mg by mouth daily.    Marland Kitchen FLUoxetine (PROZAC) 20 MG capsule Take 20 mg by mouth every morning.     . prazosin (MINIPRESS) 2 MG capsule Take 1 mg by mouth at  bedtime.     . traMADol (ULTRAM) 50 MG tablet Take 50 mg by mouth 3 (three) times daily.       No results found for this or any previous visit (from the past 48 hour(s)). No results found.  Review of Systems  Constitutional: Negative.   Respiratory: Negative.   Cardiovascular: Negative.   Gastrointestinal: Positive for blood in stool. Negative for abdominal pain, constipation and diarrhea.  Genitourinary: Negative.     Blood pressure 134/87, pulse 94, temperature 97.8 F (36.6 C), temperature source Tympanic, resp. rate 16, height 6' (1.829 m), weight 208 lb (94.3 kg), SpO2 99 %. Physical Exam  Constitutional: He is oriented to person, place, and time. He appears well-developed and well-nourished.  Eyes: Conjunctivae are normal.  Neck: Neck supple.  Cardiovascular: Normal rate and regular rhythm.   Respiratory: Effort normal and breath sounds normal.  GI: Soft. He exhibits no mass. There is no tenderness.  Genitourinary: Rectal exam shows external hemorrhoid and fissure.     Neurological: He is alert and oriented to person, place, and time.  Skin: Skin is warm and dry.     Assessment/Plan Anal fissure with external hemorrhoid. Proceed with surgery as planned- hemorrhoidectomy, lateral internal sphincterotomy  Christene Lye, MD 06/29/2016, 7:07 AM

## 2016-06-30 LAB — SURGICAL PATHOLOGY

## 2016-06-30 NOTE — Anesthesia Postprocedure Evaluation (Signed)
Anesthesia Post Note  Patient: RALPHIE OSTLING  Procedure(s) Performed: Procedure(s) (LRB): HEMORRHOIDECTOMY (N/A) FISSURECTOMY (N/A) SPHINCTEROTOMY (N/A)  Patient location during evaluation: PACU Anesthesia Type: General Level of consciousness: awake and alert and oriented Pain management: pain level controlled Vital Signs Assessment: post-procedure vital signs reviewed and stable Respiratory status: spontaneous breathing Cardiovascular status: blood pressure returned to baseline Anesthetic complications: no    Last Vitals:  Vitals:   06/29/16 0919 06/29/16 1039  BP: 114/73 117/71  Pulse: 83 78  Resp: 18 16  Temp: 37 C 36.8 C    Last Pain:  Vitals:   06/30/16 0853  TempSrc:   PainSc: 0-No pain                 Ellamae Lybeck

## 2016-07-02 ENCOUNTER — Telehealth: Payer: Self-pay | Admitting: *Deleted

## 2016-07-02 NOTE — Telephone Encounter (Signed)
Pt called back and said his b/p is 130/90. I talked with Dr Jamal Collin and he feels it can be possible anesthesia lingering in his system/reaction, to rest and take it easy for a few more days. Follow up Monday as scheduled, pt agrees.

## 2016-07-02 NOTE — Telephone Encounter (Signed)
He called to let us know that he has been having some dizziness since surgery, feels "off balance". He plans on increasing his water intake, checking his b/p and calling me back. Offered him appointment, pending at this time.

## 2016-07-06 ENCOUNTER — Ambulatory Visit (INDEPENDENT_AMBULATORY_CARE_PROVIDER_SITE_OTHER): Payer: Medicare Other | Admitting: General Surgery

## 2016-07-06 ENCOUNTER — Encounter: Payer: Self-pay | Admitting: General Surgery

## 2016-07-06 VITALS — BP 140/82 | HR 82 | Resp 14 | Ht 74.0 in

## 2016-07-06 DIAGNOSIS — K602 Anal fissure, unspecified: Secondary | ICD-10-CM

## 2016-07-06 DIAGNOSIS — K644 Residual hemorrhoidal skin tags: Secondary | ICD-10-CM

## 2016-07-06 NOTE — Progress Notes (Signed)
Patient ID: Adam Holloway, male   DOB: 1971-02-21, 45 y.o.   MRN: [id number]  Chief Complaint  Patient presents with  . Routine Post Op    hemorrhoidectomy    HPI Adam Holloway is a 45 y.o. male here today for his op op hemorrhoidectomy done on 06/26/16. Patient states he is doing well and is no longer complaining of rectal pain. I have reviewed the history of present illness with the patient.   HPI  Past Medical History:  Diagnosis Date  . Arthritis    back, left wrist, left knee  . Complication of anesthesia    bp dropped during gallbladder surgery in 2008  . Depression   . Family history of adverse reaction to anesthesia    mom stopped breathing during colonscopy  . GERD (gastroesophageal reflux disease)    h/o  . HA (headache)    h/o migrianes  . Herniated disc, cervical   . History of kidney stones    h/o stones  . Lumbago   . Pain in soft tissues of limb   . PTSD (post-traumatic stress disorder)   . Scoliosis     Past Surgical History:  Procedure Laterality Date  . CHOLECYSTECTOMY  2008  . COLONOSCOPY  2010  . COLONOSCOPY WITH PROPOFOL N/A 06/09/2016   Procedure: COLONOSCOPY WITH PROPOFOL;  Surgeon: Christene Lye, MD;  Location: [facility] ENDOSCOPY;  Service: Endoscopy;  Laterality: N/A;  . FISSURECTOMY N/A 06/29/2016   Procedure: FISSURECTOMY;  Surgeon: Christene Lye, MD;  Location: [facility] ORS;  Service: General;  Laterality: N/A;  . HEMORRHOID SURGERY N/A 06/29/2016   Procedure: HEMORRHOIDECTOMY;  Surgeon: Christene Lye, MD;  Location: [facility] ORS;  Service: General;  Laterality: N/A;  . [place]  . SPHINCTEROTOMY N/A 06/29/2016   Procedure: SPHINCTEROTOMY;  Surgeon: Christene Lye, MD;  Location: [facility] ORS;  Service: General;  Laterality: N/A;    Family History  Problem Relation Age of Onset  . Cancer Father   . Heart Problems Father   . Lung cancer Father   . Colon cancer Mother     Social History Social  History  Substance Use Topics  . Smoking status: Current Every Day Smoker    Packs/day: 1.00    Years: 32.00    Types: Cigarettes  . Smokeless tobacco: Former Systems developer     Comment: Quit 2014  . Alcohol use No    Allergies  Allergen Reactions  . Augmentin [Amoxicillin-Pot Clavulanate] Anaphylaxis  . Nsaids Other (See Comments)    Chest pain    Current Outpatient Prescriptions  Medication Sig Dispense Refill  . aspirin EC 81 MG tablet Take 81 mg by mouth daily.    Marland Kitchen FLUoxetine (PROZAC) 20 MG capsule Take 20 mg by mouth every morning.     . prazosin (MINIPRESS) 2 MG capsule Take 1 mg by mouth at bedtime.     . traMADol (ULTRAM) 50 MG tablet Take 50 mg by mouth 3 (three) times daily.     . traMADol (ULTRAM) 50 MG tablet Take 1 tablet (50 mg total) by mouth every 6 (six) hours as needed. 30 tablet 0   No current facility-administered medications for this visit.     Review of Systems Review of Systems  Blood pressure 140/82, pulse 82, resp. rate 14, height 6\' 2"  (1.88 m).  Physical Exam Physical Exam  Genitourinary: Rectal exam shows no external hemorrhoid and no fissure ( Normal healing post hemorroidectomy and lateral internal sphincterotomy.  ).  Data Reviewed Path report benign hemorrhoid tissue. Assessment      Post-op sphincterotomy and hemorrhoidectomy.  Doing very well.   Plan    Return in 3-4 weeks.      This information has been scribed by [name].  SANKAR,SEEPLAPUTHUR G 07/06/2016, 2:49 PM

## 2016-07-06 NOTE — Patient Instructions (Signed)
Return in one month.  

## 2016-07-08 DIAGNOSIS — G5700 Lesion of sciatic nerve, unspecified lower limb: Secondary | ICD-10-CM | POA: Diagnosis not present

## 2016-07-08 DIAGNOSIS — M4120 Other idiopathic scoliosis, site unspecified: Secondary | ICD-10-CM | POA: Diagnosis not present

## 2016-07-08 DIAGNOSIS — G5601 Carpal tunnel syndrome, right upper limb: Secondary | ICD-10-CM | POA: Diagnosis not present

## 2016-07-08 DIAGNOSIS — K219 Gastro-esophageal reflux disease without esophagitis: Secondary | ICD-10-CM | POA: Diagnosis not present

## 2016-07-30 ENCOUNTER — Telehealth: Payer: Self-pay | Admitting: Adult Health

## 2016-07-30 ENCOUNTER — Ambulatory Visit (INDEPENDENT_AMBULATORY_CARE_PROVIDER_SITE_OTHER): Payer: Medicare Other | Admitting: General Surgery

## 2016-07-30 ENCOUNTER — Encounter: Payer: Self-pay | Admitting: General Surgery

## 2016-07-30 VITALS — BP 130/78 | HR 74 | Resp 12 | Ht 74.0 in | Wt 201.0 lb

## 2016-07-30 DIAGNOSIS — G8929 Other chronic pain: Secondary | ICD-10-CM

## 2016-07-30 DIAGNOSIS — K602 Anal fissure, unspecified: Secondary | ICD-10-CM

## 2016-07-30 DIAGNOSIS — M545 Low back pain: Principal | ICD-10-CM

## 2016-07-30 NOTE — Progress Notes (Signed)
Patient ID: Adam Holloway, male   DOB: 1971-02-18, 45 y.o.   MRN: [id number]  Chief Complaint  Patient presents with  . Anal Fissure    HPI Adam Holloway is a 45 y.o. male here today for his post-op hemorrhoidectomy and sphincterotomy performed on 06/29/16. Patient states he is doing well and is no longer complaining of rectal pain. Admits to occasional rectal bleeding.  Also complaining of left ear pain and feels like he might have had an insect bite.  I have reviewed the history of present illness with the patient.   HPI  Past Medical History:  Diagnosis Date  . Arthritis    back, left wrist, left knee  . Complication of anesthesia    bp dropped during gallbladder surgery in 2008  . Depression   . Family history of adverse reaction to anesthesia    mom stopped breathing during colonscopy  . GERD (gastroesophageal reflux disease)    h/o  . HA (headache)    h/o migrianes  . Herniated disc, cervical   . History of kidney stones    h/o stones  . Lumbago   . Pain in soft tissues of limb   . PTSD (post-traumatic stress disorder)   . Scoliosis     Past Surgical History:  Procedure Laterality Date  . CHOLECYSTECTOMY  2008  . COLONOSCOPY  2010  . COLONOSCOPY WITH PROPOFOL N/A 06/09/2016   Procedure: COLONOSCOPY WITH PROPOFOL;  Surgeon: Christene Lye, MD;  Location: [facility] ENDOSCOPY;  Service: Endoscopy;  Laterality: N/A;  . FISSURECTOMY N/A 06/29/2016   Procedure: FISSURECTOMY;  Surgeon: Christene Lye, MD;  Location: [facility] ORS;  Service: General;  Laterality: N/A;  . HEMORRHOID SURGERY N/A 06/29/2016   Procedure: HEMORRHOIDECTOMY;  Surgeon: Christene Lye, MD;  Location: [facility] ORS;  Service: General;  Laterality: N/A;  . [place]  . SPHINCTEROTOMY N/A 06/29/2016   Procedure: SPHINCTEROTOMY;  Surgeon: Christene Lye, MD;  Location: [facility] ORS;  Service: General;  Laterality: N/A;    Family History  Problem Relation Age of  Onset  . Cancer Father   . Heart Problems Father   . Lung cancer Father   . Colon cancer Mother     Social History Social History  Substance Use Topics  . Smoking status: Current Every Day Smoker    Packs/day: 1.00    Years: 32.00    Types: Cigarettes  . Smokeless tobacco: Former Systems developer     Comment: Quit 2014  . Alcohol use No    Allergies  Allergen Reactions  . Augmentin [Amoxicillin-Pot Clavulanate] Anaphylaxis  . Nsaids Other (See Comments)    Chest pain    Current Outpatient Prescriptions  Medication Sig Dispense Refill  . aspirin EC 81 MG tablet Take 81 mg by mouth daily.    Marland Kitchen FLUoxetine (PROZAC) 20 MG capsule Take 20 mg by mouth every morning.     . prazosin (MINIPRESS) 2 MG capsule Take 1 mg by mouth at bedtime.     . traMADol (ULTRAM) 50 MG tablet Take 50 mg by mouth 3 (three) times daily.     . traMADol (ULTRAM) 50 MG tablet Take 1 tablet (50 mg total) by mouth every 6 (six) hours as needed. 30 tablet 0   No current facility-administered medications for this visit.     Review of Systems Review of Systems  Constitutional: Negative.   Respiratory: Negative.   Cardiovascular: Negative.     Blood pressure 130/78, pulse 74,  resp. rate 12, height 6\' 2"  (1.88 m), weight 201 lb (91.2 kg).  Physical Exam Physical Exam  Constitutional: He is oriented to person, place, and time. He appears well-developed and well-nourished.  HENT:  Head:    Eyes: Conjunctivae are normal. No scleral icterus.  Genitourinary: Rectal exam shows no external hemorrhoid, no internal hemorrhoid and no fissure.  Genitourinary Comments: The hemorrhoidectomy and fissure sites are fully healed. No new findings.  Neurological: He is alert and oriented to person, place, and time.  Skin: Skin is warm and dry.    Data Reviewed Notes reviewed   Assessment    Post-op sphincterotomy and hemorrhoidectomy.  No evidence of internal or external hemorrhoids or anal fissure. Doing well.  Abscess  left ear. Feel this is best dealt with by ENT. Patient reports he has seen Dr. Charolett Bumpers in the past and will contact his office.     Plan    Return as needed Advised patient to see ENT regarding possible infection of left ear  This information has been scribed by [name].      Claudina Oliphant G 07/30/2016, 12:01 PM

## 2016-07-30 NOTE — Telephone Encounter (Signed)
Patient has not appts schedule at [facility] and was last seen 01/2016.

## 2016-07-30 NOTE — Patient Instructions (Signed)
Reurn as needed.

## 2016-07-30 NOTE — Telephone Encounter (Signed)
Pt called in asking about his referral for pain management. Please call and advise

## 2016-07-31 NOTE — Telephone Encounter (Signed)
Spoke to pt and relayed that new referral placed.  Informed that 2 other referrasl had been sent on him.  The last referral to [facility] was trying to get additional records from Dr. Lavera Guise office.  (3 attempts to do this), then closed the referral.  I relayed that we only send our records and Dr. Lavera Guise would need to send his own.  He took down the fax # [phone] [facility] and I gave him [phone].  I would forward to [employer].  He verbalized understanding.

## 2016-08-03 NOTE — Telephone Encounter (Signed)
Spoke to [place] in referrals about this pt.

## 2016-08-03 NOTE — Telephone Encounter (Signed)
Spoke to Patient he is going to get records sent to me [phone]. And I will forward them to [facility] . [phone] telephone - fax [phone].

## 2016-08-11 NOTE — Telephone Encounter (Signed)
Patient is calling to see if you received medical records.

## 2016-08-11 NOTE — Telephone Encounter (Signed)
Called patient and left him a message relaying that I still have not received Medical records.

## 2016-08-13 NOTE — Telephone Encounter (Signed)
I have records I will send records to [facility] [phone]. I will fax Records to [place].

## 2016-08-26 DIAGNOSIS — I471 Supraventricular tachycardia: Secondary | ICD-10-CM | POA: Diagnosis not present

## 2016-08-26 DIAGNOSIS — K219 Gastro-esophageal reflux disease without esophagitis: Secondary | ICD-10-CM | POA: Diagnosis not present

## 2016-08-26 DIAGNOSIS — Z2089 Contact with and (suspected) exposure to other communicable diseases: Secondary | ICD-10-CM | POA: Diagnosis not present

## 2016-09-25 ENCOUNTER — Encounter: Payer: Medicare Other | Attending: Physical Medicine & Rehabilitation | Admitting: Physical Medicine & Rehabilitation

## 2016-09-25 ENCOUNTER — Encounter: Payer: Self-pay | Admitting: Physical Medicine & Rehabilitation

## 2016-09-25 VITALS — BP 126/74 | HR 94 | Resp 14

## 2016-09-25 DIAGNOSIS — Z5181 Encounter for therapeutic drug level monitoring: Secondary | ICD-10-CM | POA: Diagnosis not present

## 2016-09-25 DIAGNOSIS — F17210 Nicotine dependence, cigarettes, uncomplicated: Secondary | ICD-10-CM | POA: Diagnosis not present

## 2016-09-25 DIAGNOSIS — M549 Dorsalgia, unspecified: Secondary | ICD-10-CM | POA: Insufficient documentation

## 2016-09-25 DIAGNOSIS — M419 Scoliosis, unspecified: Secondary | ICD-10-CM | POA: Diagnosis not present

## 2016-09-25 DIAGNOSIS — G479 Sleep disorder, unspecified: Secondary | ICD-10-CM | POA: Diagnosis not present

## 2016-09-25 DIAGNOSIS — G894 Chronic pain syndrome: Secondary | ICD-10-CM | POA: Diagnosis not present

## 2016-09-25 DIAGNOSIS — Z79899 Other long term (current) drug therapy: Secondary | ICD-10-CM | POA: Diagnosis not present

## 2016-09-25 DIAGNOSIS — F339 Major depressive disorder, recurrent, unspecified: Secondary | ICD-10-CM | POA: Diagnosis not present

## 2016-09-25 DIAGNOSIS — M791 Myalgia, unspecified site: Secondary | ICD-10-CM

## 2016-09-25 DIAGNOSIS — F329 Major depressive disorder, single episode, unspecified: Secondary | ICD-10-CM | POA: Diagnosis not present

## 2016-09-25 DIAGNOSIS — G8929 Other chronic pain: Secondary | ICD-10-CM | POA: Insufficient documentation

## 2016-09-25 DIAGNOSIS — M792 Neuralgia and neuritis, unspecified: Secondary | ICD-10-CM

## 2016-09-25 DIAGNOSIS — M546 Pain in thoracic spine: Secondary | ICD-10-CM

## 2016-09-25 DIAGNOSIS — F4310 Post-traumatic stress disorder, unspecified: Secondary | ICD-10-CM | POA: Insufficient documentation

## 2016-09-25 MED ORDER — NONFORMULARY OR COMPOUNDED ITEM: 0 refills | Status: DC

## 2016-09-25 MED ORDER — GABAPENTIN 600 MG PO TABS: 600.0000 mg | ORAL_TABLET | Freq: Three times a day (TID) | ORAL | 1 refills | Status: DC

## 2016-09-25 NOTE — Addendum Note (Signed)
Addended by: Geryl Rankins D on: 09/25/2016 04:08 PM   Modules accepted: Orders
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
## 2016-10-02 LAB — TOXASSURE SELECT,+ANTIDEPR,UR

## 2016-10-05 NOTE — Progress Notes (Signed)
Urine drug screen for this encounter is consistent for prescribed medication 

## 2016-10-07 DIAGNOSIS — Z87891 Personal history of nicotine dependence: Secondary | ICD-10-CM | POA: Diagnosis not present

## 2016-10-07 DIAGNOSIS — I471 Supraventricular tachycardia: Secondary | ICD-10-CM | POA: Diagnosis not present

## 2016-10-07 DIAGNOSIS — M4120 Other idiopathic scoliosis, site unspecified: Secondary | ICD-10-CM | POA: Diagnosis not present

## 2016-10-07 DIAGNOSIS — K219 Gastro-esophageal reflux disease without esophagitis: Secondary | ICD-10-CM | POA: Diagnosis not present

## 2016-10-12 ENCOUNTER — Other Ambulatory Visit: Payer: Self-pay | Admitting: Internal Medicine

## 2016-10-12 DIAGNOSIS — K862 Cyst of pancreas: Secondary | ICD-10-CM

## 2016-10-15 ENCOUNTER — Ambulatory Visit: Admission: RE | Admit: 2016-10-15 | Payer: Medicare Other | Source: Ambulatory Visit

## 2016-10-20 ENCOUNTER — Ambulatory Visit
Admission: RE | Admit: 2016-10-20 | Discharge: 2016-10-20 | Disposition: A | Discharge status: Home / Self Care | Payer: Medicare Other | Source: Ambulatory Visit | Attending: Internal Medicine | Admitting: Internal Medicine

## 2016-10-20 DIAGNOSIS — Z9049 Acquired absence of other specified parts of digestive tract: Secondary | ICD-10-CM | POA: Insufficient documentation

## 2016-10-20 DIAGNOSIS — I708 Atherosclerosis of other arteries: Secondary | ICD-10-CM | POA: Diagnosis not present

## 2016-10-20 DIAGNOSIS — I700 Atherosclerosis of aorta: Secondary | ICD-10-CM | POA: Insufficient documentation

## 2016-10-20 DIAGNOSIS — K862 Cyst of pancreas: Secondary | ICD-10-CM

## 2016-10-20 MED ORDER — IOPAMIDOL (ISOVUE-370) INJECTION 76%
100.0000 mL | Freq: Once | INTRAVENOUS | Status: AC | PRN
  Administered 2016-10-20: 100 mL via INTRAVENOUS

## 2016-10-23 ENCOUNTER — Encounter: Payer: Medicare Other | Admitting: Physical Medicine & Rehabilitation

## 2016-10-29 ENCOUNTER — Encounter: Payer: Medicare Other | Attending: Physical Medicine & Rehabilitation | Admitting: Physical Medicine & Rehabilitation

## 2016-10-29 ENCOUNTER — Encounter: Payer: Self-pay | Admitting: Physical Medicine & Rehabilitation

## 2016-10-29 VITALS — BP 127/83 | HR 82

## 2016-10-29 DIAGNOSIS — G479 Sleep disorder, unspecified: Secondary | ICD-10-CM | POA: Insufficient documentation

## 2016-10-29 DIAGNOSIS — F329 Major depressive disorder, single episode, unspecified: Secondary | ICD-10-CM | POA: Insufficient documentation

## 2016-10-29 DIAGNOSIS — F4310 Post-traumatic stress disorder, unspecified: Secondary | ICD-10-CM | POA: Diagnosis not present

## 2016-10-29 DIAGNOSIS — F17210 Nicotine dependence, cigarettes, uncomplicated: Secondary | ICD-10-CM | POA: Insufficient documentation

## 2016-10-29 DIAGNOSIS — M791 Myalgia, unspecified site: Secondary | ICD-10-CM

## 2016-10-29 DIAGNOSIS — M549 Dorsalgia, unspecified: Secondary | ICD-10-CM | POA: Diagnosis not present

## 2016-10-29 DIAGNOSIS — M546 Pain in thoracic spine: Secondary | ICD-10-CM | POA: Diagnosis not present

## 2016-10-29 DIAGNOSIS — M419 Scoliosis, unspecified: Secondary | ICD-10-CM | POA: Insufficient documentation

## 2016-10-29 DIAGNOSIS — G894 Chronic pain syndrome: Secondary | ICD-10-CM

## 2016-10-29 DIAGNOSIS — M792 Neuralgia and neuritis, unspecified: Secondary | ICD-10-CM

## 2016-10-29 DIAGNOSIS — G8929 Other chronic pain: Secondary | ICD-10-CM | POA: Diagnosis not present

## 2016-10-29 DIAGNOSIS — F339 Major depressive disorder, recurrent, unspecified: Secondary | ICD-10-CM | POA: Diagnosis not present

## 2016-10-29 MED ORDER — TAPENTADOL HCL 50 MG PO TABS: 50.0000 mg | ORAL_TABLET | Freq: Two times a day (BID) | ORAL | 0 refills | Status: DC | PRN

## 2016-10-29 MED ORDER — GABAPENTIN 300 MG PO CAPS
300.0000 mg | ORAL_CAPSULE | Freq: Three times a day (TID) | ORAL | 1 refills | Status: DC
Start: 2016-10-29 — End: 2017-01-22

## 2016-10-29 MED ORDER — AMITRIPTYLINE HCL 10 MG PO TABS: 10.0000 mg | ORAL_TABLET | Freq: Every day | ORAL | 1 refills | Status: DC
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
## 2016-11-05 ENCOUNTER — Telehealth: Payer: Self-pay | Admitting: *Deleted

## 2016-11-05 NOTE — Telephone Encounter (Signed)
Patient left message stating that he is unable to tolerate nucynta.  States that it is making him throw up and causing his B/P to drop.  He has stopped taking and is asking for alternatives

## 2016-11-05 NOTE — Telephone Encounter (Signed)
If he is have serious side effects, he should stop taking the medication.  If side effects are transient or inconvenience (would monitor BP), would take medications daily for 3-4 days and then increase to BID to see if symptoms resolve.  No other medications recommended at this time.  Thanks.

## 2016-11-05 NOTE — Telephone Encounter (Signed)
Mr Stevphen Rochester has called back awaiting advice about the Nucynta problem.

## 2016-11-06 MED ORDER — ACETAMINOPHEN-CODEINE #3 300-30 MG PO TABS: 1.0000 | ORAL_TABLET | Freq: Three times a day (TID) | ORAL | 0 refills | Status: DC | PRN

## 2016-11-06 NOTE — Telephone Encounter (Signed)
Called patient with change in script, patient has outright refused to take nycenta due to the issues with his blood pressure and is requesting a different medication.

## 2016-11-06 NOTE — Telephone Encounter (Signed)
Pt may have a prescription for Tylenol #3, 300/15 TID PRN

## 2016-11-06 NOTE — Telephone Encounter (Signed)
I notified Adam Holloway of medication called to pharmacy.  He needs an appt to be seen by Posey Pronto. Corene Cornea please find him an appt around 11/26/16

## 2016-11-26 ENCOUNTER — Encounter: Payer: Self-pay | Admitting: Physical Medicine & Rehabilitation

## 2016-11-26 ENCOUNTER — Encounter: Payer: Medicare Other | Attending: Physical Medicine & Rehabilitation | Admitting: Physical Medicine & Rehabilitation

## 2016-11-26 VITALS — BP 126/84 | HR 73 | Resp 14

## 2016-11-26 DIAGNOSIS — G894 Chronic pain syndrome: Secondary | ICD-10-CM

## 2016-11-26 DIAGNOSIS — M792 Neuralgia and neuritis, unspecified: Secondary | ICD-10-CM | POA: Diagnosis not present

## 2016-11-26 DIAGNOSIS — F329 Major depressive disorder, single episode, unspecified: Secondary | ICD-10-CM | POA: Insufficient documentation

## 2016-11-26 DIAGNOSIS — M546 Pain in thoracic spine: Secondary | ICD-10-CM

## 2016-11-26 DIAGNOSIS — M419 Scoliosis, unspecified: Secondary | ICD-10-CM | POA: Diagnosis not present

## 2016-11-26 DIAGNOSIS — M79642 Pain in left hand: Secondary | ICD-10-CM

## 2016-11-26 DIAGNOSIS — G8929 Other chronic pain: Secondary | ICD-10-CM | POA: Diagnosis not present

## 2016-11-26 DIAGNOSIS — G479 Sleep disorder, unspecified: Secondary | ICD-10-CM | POA: Diagnosis not present

## 2016-11-26 DIAGNOSIS — F339 Major depressive disorder, recurrent, unspecified: Secondary | ICD-10-CM

## 2016-11-26 DIAGNOSIS — F4310 Post-traumatic stress disorder, unspecified: Secondary | ICD-10-CM | POA: Insufficient documentation

## 2016-11-26 DIAGNOSIS — M791 Myalgia, unspecified site: Secondary | ICD-10-CM

## 2016-11-26 DIAGNOSIS — M549 Dorsalgia, unspecified: Secondary | ICD-10-CM | POA: Diagnosis not present

## 2016-11-26 DIAGNOSIS — F17210 Nicotine dependence, cigarettes, uncomplicated: Secondary | ICD-10-CM | POA: Diagnosis not present
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
## 2016-11-27 ENCOUNTER — Other Ambulatory Visit: Payer: Self-pay

## 2016-11-27 MED ORDER — ACETAMINOPHEN-CODEINE #3 300-30 MG PO TABS: 1.0000 | ORAL_TABLET | Freq: Three times a day (TID) | ORAL | 0 refills | Status: DC | PRN

## 2016-11-27 NOTE — Telephone Encounter (Signed)
I left a message for Adam Holloway letting him know that we will call in one month supply of the tylenol #3 but that he must have an appointment to receive any further refills. It is after hours and he can not call the office back but I also explained that Adam Holloway feels that this was discussed at his appointment. NO further refills without being seen at regularly scheduled appointments. He will need to make an appointment for next month.

## 2016-11-27 NOTE — Telephone Encounter (Signed)
Patient called and left message after leaving from appointment, has requested a refill of tylenol and codeine, stated in note that this medication is now D/Ced, please advise

## 2016-11-27 NOTE — Telephone Encounter (Signed)
The medication was discontinued due to lack of efficacy.  He also stated that he will call our office back if he wants to make an appointment, but was not scheduled, therefore, no plans to refill meds.

## 2016-11-27 NOTE — Telephone Encounter (Signed)
If he wants to continue to be seen and take the tyl #3, do you follow the q 3 month visits with the tylenol #3 ?

## 2016-11-28 ENCOUNTER — Other Ambulatory Visit: Payer: Self-pay | Admitting: Physical Medicine & Rehabilitation

## 2016-11-28 NOTE — Telephone Encounter (Signed)
He also needs to be called for a UDS and to sign a contract early next week. Thanks

## 2016-11-30 NOTE — Telephone Encounter (Signed)
He was not able to tolerate the increase in Gabapentin and then stated it was not working.  It may be refilled at the lower dose (300 mg) if patient would like. Thanks.

## 2016-11-30 NOTE — Telephone Encounter (Signed)
Recieved electronic request to refill gabapentin, on last note it stated:   Not able to tolerate Cymbalta, baclofen, NSAIDs, Lidoderm patch, Gabapentin, Nucynta  Not sure if ok to refill this medication, please advise

## 2016-12-07 ENCOUNTER — Encounter (INDEPENDENT_AMBULATORY_CARE_PROVIDER_SITE_OTHER): Payer: Self-pay | Admitting: Orthopaedic Surgery

## 2016-12-07 ENCOUNTER — Ambulatory Visit (INDEPENDENT_AMBULATORY_CARE_PROVIDER_SITE_OTHER): Payer: Medicare Other

## 2016-12-07 ENCOUNTER — Ambulatory Visit (INDEPENDENT_AMBULATORY_CARE_PROVIDER_SITE_OTHER): Payer: Medicare Other | Admitting: Orthopaedic Surgery

## 2016-12-07 VITALS — BP 121/95 | HR 80 | Resp 14 | Ht 74.0 in | Wt 205.0 lb

## 2016-12-07 DIAGNOSIS — M25532 Pain in left wrist: Secondary | ICD-10-CM

## 2016-12-07 DIAGNOSIS — M79642 Pain in left hand: Secondary | ICD-10-CM

## 2016-12-07 NOTE — Progress Notes (Signed)
Office Visit Note   Patient: Adam Holloway           Date of Birth: Jul 31, 1971           MRN: [id number] Visit Date: 12/07/2016              Requested by: [place], MD [address] [place], [place] [address] PCP: [name], MD   Assessment & Plan: Visit Diagnoses: Chronic left nondominant wrist pain without history of injury or trauma. No related numbness or tingling. Films reveal some abnormality about the carpal lunate joint. Doubt carpal tunnel syndrome. Possible abnormality of lunate  Plan: MRI scan left wrist  Follow-Up Instructions: No Follow-up on file.   Orders:  No orders of the defined types were placed in this encounter.  No orders of the defined types were placed in this encounter.     Procedures: No procedures performed   Clinical Data: No additional findings.   Subjective: No chief complaint on file.   [name] is a 46 year old male that presents with left wrist pain, stiffness,numbness , cannot grasp, swollen on the top and bottom of his left hand, x 4 months. Denies injury, currently disabled and does not work.   Insidious onset of pain in November 2017. Not experiencing significant numbness or tingling. Pain is diffusely identified along the dorsum and volar aspect of the left nondominant wrist. On occasion he has some localized swelling on the dorsal side of the wrist radially. Has been on disability for PTSD and problems with chronic back pain  Review of Systems   Objective: Vital Signs: There were no vitals taken for this visit.  Physical Exam  Ortho Exam left nondominant hand evaluation reveals negative Tinel's and Phalen's. Able to make a fist although was uncomfortable with pain diffusely about the radiocarpal joint both dorsally and volarly. No pain at the base of the thumb. Good capillary refill with good pulses.Pain Diffusely along the dorsum of the wrist without masses. No localized swelling erythema or  ecchymosis.  Specialty Comments:  No specialty comments available.  Imaging: No results found.   PMFS History: Patient Active Problem List   Diagnosis Date Noted  . HA (headache)   . Pain in soft tissues of limb   . Lumbago   . Gynecomastia, male 07/12/2013   Past Medical History:  Diagnosis Date  . Arthritis    back, left wrist, left knee  . Complication of anesthesia    bp dropped during gallbladder surgery in 2008  . Depression   . Family history of adverse reaction to anesthesia    mom stopped breathing during colonscopy  . GERD (gastroesophageal reflux disease)    h/o  . HA (headache)    h/o migrianes  . Herniated disc, cervical   . History of kidney stones    h/o stones  . Lumbago   . Pain in soft tissues of limb   . PTSD (post-traumatic stress disorder)   . Scoliosis     Family History  Problem Relation Age of Onset  . Cancer Father   . Heart Problems Father   . Lung cancer Father   . Colon cancer Mother     Past Surgical History:  Procedure Laterality Date  . CHOLECYSTECTOMY  2008  . COLONOSCOPY  2010  . COLONOSCOPY WITH PROPOFOL N/A 06/09/2016   Procedure: COLONOSCOPY WITH PROPOFOL;  Surgeon: Christene Lye, MD;  Location: [facility];  Service: Endoscopy;  Laterality: N/A;  .  FISSURECTOMY N/A 06/29/2016   Procedure: FISSURECTOMY;  Surgeon: Christene Lye, MD;  Location: [facility];  Service: General;  Laterality: N/A;  . HEMORRHOID SURGERY N/A 06/29/2016   Procedure: HEMORRHOIDECTOMY;  Surgeon: Christene Lye, MD;  Location: [facility];  Service: General;  Laterality: N/A;  . [place]  . SPHINCTEROTOMY N/A 06/29/2016   Procedure: SPHINCTEROTOMY;  Surgeon: Christene Lye, MD;  Location: [facility];  Service: General;  Laterality: N/A;   Social History   Occupational History  .      Disabled   Social History Main Topics  . Smoking status: Current Every Day Smoker    Packs/day: 1.00    Years: 32.00      Types: Cigarettes  . Smokeless tobacco: Former Systems developer     Comment: Quit 2014  . Alcohol use No  . Drug use: No  . Sexual activity: Not on file

## 2016-12-10 ENCOUNTER — Telehealth: Payer: Self-pay | Admitting: *Deleted

## 2016-12-10 NOTE — Telephone Encounter (Addendum)
Adam Holloway called for refill on his gabapentin.  He says 600 mg tabs are ok with him.  He is ordered on 300 mg tid and when he was here he said it was not working and useless to take. By our records he should have one refill.  Dr Posey Pronto is out of office until next Wednesday.  No changes will be made. Verified that he has refill on [facility] at [facility]. Note from previous "refill encounter 11/28/16 "  : I left a message for Adam Holloway letting him know that we will call in one month supply of the tylenol #3 but that he must have an appointment to receive any further refills. It is after hours and he can not call the office back but I also explained that Dr Posey Pronto feels that this was discussed at his appointment. NO further refills without being seen at regularly scheduled appointments. He will need to make an appointment for next month.  He has not made return appt.

## 2016-12-15 ENCOUNTER — Other Ambulatory Visit: Payer: Self-pay | Admitting: Physical Medicine & Rehabilitation

## 2016-12-15 ENCOUNTER — Ambulatory Visit
Admission: RE | Admit: 2016-12-15 | Discharge: 2016-12-15 | Disposition: A | Discharge status: Home / Self Care | Payer: Medicare Other | Source: Ambulatory Visit | Attending: Orthopaedic Surgery | Admitting: Orthopaedic Surgery

## 2016-12-15 DIAGNOSIS — M25532 Pain in left wrist: Secondary | ICD-10-CM | POA: Diagnosis not present

## 2016-12-17 ENCOUNTER — Ambulatory Visit: Payer: Medicare Other

## 2016-12-21 ENCOUNTER — Other Ambulatory Visit (INDEPENDENT_AMBULATORY_CARE_PROVIDER_SITE_OTHER): Payer: Self-pay

## 2016-12-21 ENCOUNTER — Telehealth (INDEPENDENT_AMBULATORY_CARE_PROVIDER_SITE_OTHER): Payer: Self-pay | Admitting: Orthopaedic Surgery

## 2016-12-21 MED ORDER — METHYLPREDNISOLONE 4 MG PO TBPK: ORAL_TABLET | ORAL | 0 refills | Status: DC

## 2016-12-21 NOTE — Telephone Encounter (Signed)
sent 

## 2016-12-21 NOTE — Telephone Encounter (Signed)
Medrol dosepack from [facility] [place]- 4mg  tabs

## 2016-12-21 NOTE — Telephone Encounter (Signed)
Please advise 

## 2016-12-21 NOTE — Telephone Encounter (Signed)
Patient has scheduled appointment on 12/28/16 for MRI results. Patient states his wrist is swelling and very painful and he would like a phone call to further discuss the symptoms and possibly talk to Dr.Whitfield before 12/28/16. Please advise.

## 2016-12-24 ENCOUNTER — Ambulatory Visit (INDEPENDENT_AMBULATORY_CARE_PROVIDER_SITE_OTHER): Payer: Medicare Other | Admitting: Orthopaedic Surgery

## 2016-12-24 ENCOUNTER — Other Ambulatory Visit: Payer: Self-pay | Admitting: Physical Medicine & Rehabilitation

## 2016-12-24 ENCOUNTER — Encounter (INDEPENDENT_AMBULATORY_CARE_PROVIDER_SITE_OTHER): Payer: Self-pay | Admitting: Orthopaedic Surgery

## 2016-12-24 VITALS — Ht 72.0 in | Wt 185.0 lb

## 2016-12-24 DIAGNOSIS — M79642 Pain in left hand: Secondary | ICD-10-CM | POA: Diagnosis not present

## 2016-12-24 MED ORDER — HYDROCODONE-ACETAMINOPHEN 5-325 MG PO TABS: 1.0000 | ORAL_TABLET | Freq: Two times a day (BID) | ORAL | 0 refills | Status: DC | PRN

## 2016-12-24 NOTE — Telephone Encounter (Signed)
Recieved electronic medication refill request for amitriptyline, according to last note:  Sleep disturbance             Pt states does not like side effects of Elavil and reluctant to try even low dose  Questioned Dr. Posey Pronto in office and he states that he refused the refill during his last appointment so no refill will be authorized at this time

## 2016-12-24 NOTE — Progress Notes (Signed)
Office Visit Note   Patient: Adam Holloway           Date of Birth: 09-24-1970           MRN: [id number] Visit Date: 12/24/2016              Requested by: [name], MD [address] [place], [place] [address] PCP: [name], MD   Assessment & Plan: Visit Diagnoses:  1. Pain of left hand   MRI scan demonstrates a foreign body projecting in the lunate . No other abnormalities were identified to explain the patient's symptoms. Carpal canal appeared to be normal. Some arthritis at the base of the thumb but that does not explain all of the symptoms.Not sure why he is experiencing the complex of symptoms. Long discussion with Adam Holloway today as well as on the phone explaining the MRI scan report.  Plan: Appointment with Dr. Fredna Dow, prescription for hydrocodone with Tylenol No. 20  Follow-Up Instructions: Return if symptoms worsen or fail to improve.   Orders:  No orders of the defined types were placed in this encounter.  No orders of the defined types were placed in this encounter.     Procedures: No procedures performed   Clinical Data: No additional findings.   Subjective: Chief Complaint  Patient presents with  . Left Wrist - Results, Pain    Adam Holloway is a 46 y o male that presents with left wrist pain. He relates the pain has become increasingly worse. He wears a splint but has not relief.  Numbness in 3rd and 4th fingers   Adam Holloway is been followed for the problem referable to his left wrist. On plain films he has evidence of a small metallic foreign body embedded in the lunate bone but there is no history of a penetrating injury recently. On rare occasion experiences some numbness in the radial 3 digits. Median nerve appeared to be normal on MRI scan. He is experiencing some pain at the base of the thumb as well as across the dorsum of his wrist. He denies fever, chills, or any skin changes. HPI  Review of Systems   Objective: Vital Signs: Ht 6'  (1.829 m)   Wt 185 lb (83.9 kg)   BMI 25.09 kg/m   Physical Exam  Ortho Exam examination of the left wrist revealed no swelling, ecchymosis or erythema. Negative Tinel's and Phalen's across the median nerve. Full range of motion of the fingers. Normal  opposition. Of thumb no evidence of triggering. Minimally positive grind test at the base of the thumb without deformity. No specific tenderness along the TFCC. Full range of motion of wrist and elbow.  Specialty Comments:  No specialty comments available.  Imaging: No results found.   PMFS History: Patient Active Problem List   Diagnosis Date Noted  . HA (headache)   . Pain in soft tissues of limb   . Lumbago   . Gynecomastia, male 07/12/2013   Past Medical History:  Diagnosis Date  . Arthritis    back, left wrist, left knee  . Complication of anesthesia    bp dropped during gallbladder surgery in 2008  . Depression   . Family history of adverse reaction to anesthesia    mom stopped breathing during colonscopy  . GERD (gastroesophageal reflux disease)    h/o  . HA (headache)    h/o migrianes  . Herniated disc, cervical   . History of kidney stones    h/o stones  .  Lumbago   . Pain in soft tissues of limb   . PTSD (post-traumatic stress disorder)   . Scoliosis     Family History  Problem Relation Age of Onset  . Cancer Father   . Heart Problems Father   . Lung cancer Father   . Colon cancer Mother     Past Surgical History:  Procedure Laterality Date  . CHOLECYSTECTOMY  2008  . COLONOSCOPY  2010  . COLONOSCOPY WITH PROPOFOL N/A 06/09/2016   Procedure: COLONOSCOPY WITH PROPOFOL;  Surgeon: Christene Lye, MD;  Location: [facility];  Service: Endoscopy;  Laterality: N/A;  . FISSURECTOMY N/A 06/29/2016   Procedure: FISSURECTOMY;  Surgeon: Christene Lye, MD;  Location: [facility];  Service: General;  Laterality: N/A;  . HEMORRHOID SURGERY N/A 06/29/2016   Procedure: HEMORRHOIDECTOMY;  Surgeon:  Christene Lye, MD;  Location: [facility];  Service: General;  Laterality: N/A;  . [place]  . SPHINCTEROTOMY N/A 06/29/2016   Procedure: SPHINCTEROTOMY;  Surgeon: Christene Lye, MD;  Location: [facility];  Service: General;  Laterality: N/A;   Social History   Occupational History  .      Disabled   Social History Main Topics  . Smoking status: Current Every Day Smoker    Packs/day: 1.00    Years: 32.00    Types: Cigarettes  . Smokeless tobacco: Former Systems developer     Comment: Quit 2014  . Alcohol use No  . Drug use: No  . Sexual activity: Not on file

## 2016-12-28 ENCOUNTER — Other Ambulatory Visit: Payer: Self-pay | Admitting: Physical Medicine & Rehabilitation

## 2016-12-28 ENCOUNTER — Ambulatory Visit (INDEPENDENT_AMBULATORY_CARE_PROVIDER_SITE_OTHER): Payer: Self-pay | Admitting: Orthopaedic Surgery

## 2017-01-06 DIAGNOSIS — S60552A Superficial foreign body of left hand, initial encounter: Secondary | ICD-10-CM | POA: Diagnosis not present

## 2017-01-07 ENCOUNTER — Other Ambulatory Visit: Payer: Self-pay | Admitting: Pharmacist

## 2017-01-07 ENCOUNTER — Other Ambulatory Visit: Payer: Self-pay | Admitting: Orthopedic Surgery

## 2017-01-07 ENCOUNTER — Other Ambulatory Visit
Admission: RE | Admit: 2017-01-07 | Discharge: 2017-01-07 | Disposition: A | Discharge status: Home / Self Care | Payer: Medicare Other | Source: Ambulatory Visit | Attending: Orthopedic Surgery | Admitting: Orthopedic Surgery

## 2017-01-07 DIAGNOSIS — M795 Residual foreign body in soft tissue: Secondary | ICD-10-CM | POA: Insufficient documentation

## 2017-01-07 DIAGNOSIS — S6992XA Unspecified injury of left wrist, hand and finger(s), initial encounter: Secondary | ICD-10-CM

## 2017-01-07 LAB — SEDIMENTATION RATE: Sed Rate: 6 mm/hr (ref 0–15)

## 2017-01-07 LAB — CBC
HEMATOCRIT: 43.5 % (ref 40.0–52.0)
Hemoglobin: 14.6 g/dL (ref 13.0–18.0)
MCH: 30.1 pg (ref 26.0–34.0)
MCHC: 33.4 g/dL (ref 32.0–36.0)
MCV: 90.1 fL (ref 80.0–100.0)
Platelets: 140 10*3/uL — ABNORMAL LOW (ref 150–440)
RBC: 4.83 MIL/uL (ref 4.40–5.90)
RDW: 14.7 % — AB (ref 11.5–14.5)
WBC: 7.7 10*3/uL (ref 3.8–10.6)

## 2017-01-07 LAB — URIC ACID: Uric Acid, Serum: 5.7 mg/dL (ref 4.4–7.6)

## 2017-01-11 ENCOUNTER — Ambulatory Visit
Admission: RE | Admit: 2017-01-11 | Discharge: 2017-01-11 | Disposition: A | Discharge status: Home / Self Care | Payer: Medicare Other | Source: Ambulatory Visit | Attending: Orthopedic Surgery | Admitting: Orthopedic Surgery

## 2017-01-11 ENCOUNTER — Encounter (INDEPENDENT_AMBULATORY_CARE_PROVIDER_SITE_OTHER): Payer: Self-pay

## 2017-01-11 DIAGNOSIS — S6992XA Unspecified injury of left wrist, hand and finger(s), initial encounter: Secondary | ICD-10-CM

## 2017-01-11 DIAGNOSIS — R936 Abnormal findings on diagnostic imaging of limbs: Secondary | ICD-10-CM | POA: Diagnosis not present

## 2017-01-11 DIAGNOSIS — M899 Disorder of bone, unspecified: Secondary | ICD-10-CM | POA: Diagnosis not present

## 2017-01-11 DIAGNOSIS — M795 Residual foreign body in soft tissue: Secondary | ICD-10-CM | POA: Insufficient documentation

## 2017-01-11 DIAGNOSIS — S60852A Superficial foreign body of left wrist, initial encounter: Secondary | ICD-10-CM | POA: Diagnosis not present

## 2017-01-18 DIAGNOSIS — S60552A Superficial foreign body of left hand, initial encounter: Secondary | ICD-10-CM | POA: Diagnosis not present

## 2017-01-18 DIAGNOSIS — M19142 Post-traumatic osteoarthritis, left hand: Secondary | ICD-10-CM | POA: Diagnosis not present

## 2017-01-19 ENCOUNTER — Other Ambulatory Visit: Payer: Self-pay | Admitting: Orthopedic Surgery

## 2017-01-22 ENCOUNTER — Encounter (HOSPITAL_BASED_OUTPATIENT_CLINIC_OR_DEPARTMENT_OTHER): Payer: Self-pay | Admitting: *Deleted

## 2017-01-24 ENCOUNTER — Other Ambulatory Visit: Payer: Self-pay | Admitting: Physical Medicine & Rehabilitation

## 2017-01-26 ENCOUNTER — Ambulatory Visit (HOSPITAL_BASED_OUTPATIENT_CLINIC_OR_DEPARTMENT_OTHER)
Admission: RE | Admit: 2017-01-26 | Discharge: 2017-01-26 | Disposition: A | Discharge status: Home / Self Care | Payer: Medicare Other | Source: Ambulatory Visit | Attending: Orthopedic Surgery | Admitting: Orthopedic Surgery

## 2017-01-26 ENCOUNTER — Encounter (HOSPITAL_BASED_OUTPATIENT_CLINIC_OR_DEPARTMENT_OTHER)
Admission: RE | Disposition: A | Discharge status: Home / Self Care | Payer: Self-pay | Source: Ambulatory Visit | Attending: Orthopedic Surgery

## 2017-01-26 ENCOUNTER — Encounter (HOSPITAL_BASED_OUTPATIENT_CLINIC_OR_DEPARTMENT_OTHER): Payer: Self-pay | Admitting: Anesthesiology

## 2017-01-26 ENCOUNTER — Ambulatory Visit (HOSPITAL_BASED_OUTPATIENT_CLINIC_OR_DEPARTMENT_OTHER): Payer: Medicare Other | Admitting: Anesthesiology

## 2017-01-26 DIAGNOSIS — X58XXXS Exposure to other specified factors, sequela: Secondary | ICD-10-CM | POA: Diagnosis not present

## 2017-01-26 DIAGNOSIS — Z87442 Personal history of urinary calculi: Secondary | ICD-10-CM | POA: Diagnosis not present

## 2017-01-26 DIAGNOSIS — M19132 Post-traumatic osteoarthritis, left wrist: Secondary | ICD-10-CM | POA: Insufficient documentation

## 2017-01-26 DIAGNOSIS — M479 Spondylosis, unspecified: Secondary | ICD-10-CM | POA: Diagnosis not present

## 2017-01-26 DIAGNOSIS — T1490XS Injury, unspecified, sequela: Secondary | ICD-10-CM | POA: Diagnosis not present

## 2017-01-26 DIAGNOSIS — Z880 Allergy status to penicillin: Secondary | ICD-10-CM | POA: Insufficient documentation

## 2017-01-26 DIAGNOSIS — M19032 Primary osteoarthritis, left wrist: Secondary | ICD-10-CM | POA: Diagnosis not present

## 2017-01-26 DIAGNOSIS — R51 Headache: Secondary | ICD-10-CM | POA: Diagnosis not present

## 2017-01-26 DIAGNOSIS — S60852A Superficial foreign body of left wrist, initial encounter: Secondary | ICD-10-CM | POA: Diagnosis not present

## 2017-01-26 DIAGNOSIS — F4310 Post-traumatic stress disorder, unspecified: Secondary | ICD-10-CM | POA: Diagnosis not present

## 2017-01-26 DIAGNOSIS — F17210 Nicotine dependence, cigarettes, uncomplicated: Secondary | ICD-10-CM | POA: Diagnosis not present

## 2017-01-26 DIAGNOSIS — M24032 Loose body in left wrist: Secondary | ICD-10-CM | POA: Insufficient documentation

## 2017-01-26 DIAGNOSIS — M419 Scoliosis, unspecified: Secondary | ICD-10-CM | POA: Diagnosis not present

## 2017-01-26 DIAGNOSIS — M19042 Primary osteoarthritis, left hand: Secondary | ICD-10-CM | POA: Diagnosis not present

## 2017-01-26 DIAGNOSIS — M1712 Unilateral primary osteoarthritis, left knee: Secondary | ICD-10-CM | POA: Diagnosis not present

## 2017-01-26 DIAGNOSIS — F329 Major depressive disorder, single episode, unspecified: Secondary | ICD-10-CM | POA: Diagnosis not present

## 2017-01-26 DIAGNOSIS — K219 Gastro-esophageal reflux disease without esophagitis: Secondary | ICD-10-CM | POA: Diagnosis not present

## 2017-01-26 DIAGNOSIS — N62 Hypertrophy of breast: Secondary | ICD-10-CM | POA: Diagnosis not present

## 2017-01-26 DIAGNOSIS — M545 Low back pain: Secondary | ICD-10-CM | POA: Diagnosis not present

## 2017-01-26 DIAGNOSIS — Z1810 Retained metal fragments, unspecified: Secondary | ICD-10-CM | POA: Diagnosis not present

## 2017-01-26 HISTORY — PX: WRIST ARTHROSCOPY: SHX838

## 2017-01-26 SURGERY — ARTHROSCOPY, WRIST
Anesthesia: Monitor Anesthesia Care | Site: Wrist | Laterality: Left

## 2017-01-26 MED ORDER — MIDAZOLAM HCL 2 MG/2ML IJ SOLN
INTRAMUSCULAR | Status: AC
  Filled 2017-01-26: qty 2

## 2017-01-26 MED ORDER — OXYCODONE HCL 5 MG PO TABS: 5.0000 mg | ORAL_TABLET | Freq: Once | ORAL | Status: DC | PRN

## 2017-01-26 MED ORDER — LIDOCAINE 2% (20 MG/ML) 5 ML SYRINGE
INTRAMUSCULAR | Status: AC
  Filled 2017-01-26: qty 5

## 2017-01-26 MED ORDER — MIDAZOLAM HCL 5 MG/5ML IJ SOLN
INTRAMUSCULAR | Status: DC | PRN
  Administered 2017-01-26: 2 mg via INTRAVENOUS

## 2017-01-26 MED ORDER — BUPIVACAINE HCL (PF) 0.25 % IJ SOLN
INTRAMUSCULAR | Status: AC
Start: 2017-01-26 — End: 2017-01-26
  Filled 2017-01-26: qty 120

## 2017-01-26 MED ORDER — PROMETHAZINE HCL 25 MG/ML IJ SOLN: 6.2500 mg | INTRAMUSCULAR | Status: DC | PRN

## 2017-01-26 MED ORDER — KETOROLAC TROMETHAMINE 30 MG/ML IJ SOLN
INTRAMUSCULAR | Status: AC
  Filled 2017-01-26: qty 1

## 2017-01-26 MED ORDER — PROPOFOL 10 MG/ML IV BOLUS
INTRAVENOUS | Status: DC | PRN
  Administered 2017-01-26 (×10): 10 mg via INTRAVENOUS

## 2017-01-26 MED ORDER — ROPIVACAINE HCL 5 MG/ML IJ SOLN
INTRAMUSCULAR | Status: DC | PRN
  Administered 2017-01-26: 30 mL via EPIDURAL

## 2017-01-26 MED ORDER — HYDROMORPHONE HCL 1 MG/ML IJ SOLN: 0.2500 mg | INTRAMUSCULAR | Status: DC | PRN

## 2017-01-26 MED ORDER — MIDAZOLAM HCL 2 MG/2ML IJ SOLN
1.0000 mg | INTRAMUSCULAR | Status: DC | PRN
  Administered 2017-01-26: 2 mg via INTRAVENOUS

## 2017-01-26 MED ORDER — ONDANSETRON HCL 4 MG/2ML IJ SOLN
INTRAMUSCULAR | Status: DC | PRN
  Administered 2017-01-26: 4 mg via INTRAVENOUS

## 2017-01-26 MED ORDER — HYDROCODONE-ACETAMINOPHEN 5-325 MG PO TABS: 1.0000 | ORAL_TABLET | Freq: Four times a day (QID) | ORAL | 0 refills | Status: DC | PRN

## 2017-01-26 MED ORDER — FENTANYL CITRATE (PF) 100 MCG/2ML IJ SOLN
INTRAMUSCULAR | Status: AC
  Filled 2017-01-26: qty 2

## 2017-01-26 MED ORDER — FENTANYL CITRATE (PF) 100 MCG/2ML IJ SOLN
INTRAMUSCULAR | Status: DC | PRN
  Administered 2017-01-26: 100 ug via INTRAVENOUS

## 2017-01-26 MED ORDER — LIDOCAINE HCL (CARDIAC) 20 MG/ML IV SOLN
INTRAVENOUS | Status: DC | PRN
  Administered 2017-01-26: 10 mg via INTRAVENOUS

## 2017-01-26 MED ORDER — VANCOMYCIN HCL IN DEXTROSE 1-5 GM/200ML-% IV SOLN
1000.0000 mg | INTRAVENOUS | Status: AC
  Administered 2017-01-26: 1000 mg via INTRAVENOUS

## 2017-01-26 MED ORDER — VANCOMYCIN HCL IN DEXTROSE 1-5 GM/200ML-% IV SOLN
INTRAVENOUS | Status: AC
  Filled 2017-01-26: qty 200

## 2017-01-26 MED ORDER — SCOPOLAMINE 1 MG/3DAYS TD PT72: 1.0000 | MEDICATED_PATCH | Freq: Once | TRANSDERMAL | Status: DC | PRN

## 2017-01-26 MED ORDER — FENTANYL CITRATE (PF) 100 MCG/2ML IJ SOLN
50.0000 ug | INTRAMUSCULAR | Status: DC | PRN
  Administered 2017-01-26: 100 ug via INTRAVENOUS

## 2017-01-26 MED ORDER — ONDANSETRON HCL 4 MG/2ML IJ SOLN
INTRAMUSCULAR | Status: AC
  Filled 2017-01-26: qty 2

## 2017-01-26 MED ORDER — LACTATED RINGERS IV SOLN
INTRAVENOUS | Status: DC | PRN
Start: 2017-01-26 — End: 2017-01-26
  Administered 2017-01-26 (×2): via INTRAVENOUS

## 2017-01-26 MED ORDER — CHLORHEXIDINE GLUCONATE 4 % EX LIQD: 60.0000 mL | Freq: Once | CUTANEOUS | Status: DC

## 2017-01-26 MED ORDER — LACTATED RINGERS IV SOLN
INTRAVENOUS | Status: DC
  Administered 2017-01-26: 08:00:00 via INTRAVENOUS

## 2017-01-26 MED ORDER — OXYCODONE HCL 5 MG/5ML PO SOLN: 5.0000 mg | Freq: Once | ORAL | Status: DC | PRN

## 2017-01-26 MED ORDER — PROPOFOL 500 MG/50ML IV EMUL
INTRAVENOUS | Status: AC
  Filled 2017-01-26: qty 50

## 2017-01-26 SURGICAL SUPPLY — 77 items
BLADE CUDA 2.0 (BLADE) IMPLANT
BLADE EAR TYMPAN 2.5 60D BEAV (BLADE) IMPLANT
BLADE MINI RND TIP GREEN BEAV (BLADE) IMPLANT
BLADE SURG 15 STRL LF DISP TIS (BLADE) ×1 IMPLANT
BLADE SURG 15 STRL SS (BLADE) ×2
BNDG COHESIVE 3X5 TAN STRL LF (GAUZE/BANDAGES/DRESSINGS) ×3 IMPLANT
BNDG ESMARK 4X9 LF (GAUZE/BANDAGES/DRESSINGS) IMPLANT
BNDG GAUZE ELAST 4 BULKY (GAUZE/BANDAGES/DRESSINGS) ×3 IMPLANT
BUR CUDA 2.9 (BURR) IMPLANT
BUR CUDA 2.9MM (BURR)
BUR FULL RADIUS 2.0 (BURR) IMPLANT
BUR FULL RADIUS 2.0MM (BURR)
BUR FULL RADIUS 2.9 (BURR) ×2 IMPLANT
BUR FULL RADIUS 2.9MM (BURR) ×1
BUR GATOR 2.9 (BURR) ×2 IMPLANT
BUR GATOR 2.9MM (BURR) ×1
BUR SPHERICAL 2.9 (BURR) IMPLANT
BUR SPHERICAL 2.9MM (BURR)
CANISTER SUCT 1200ML W/VALVE (MISCELLANEOUS) IMPLANT
CHLORAPREP W/TINT 26ML (MISCELLANEOUS) ×3 IMPLANT
CORDS BIPOLAR (ELECTRODE) IMPLANT
COVER BACK TABLE 60X90IN (DRAPES) ×3 IMPLANT
COVER MAYO STAND STRL (DRAPES) ×3 IMPLANT
CUFF TOURNIQUET SINGLE 18IN (TOURNIQUET CUFF) IMPLANT
DRAPE EXTREMITY T 121X128X90 (DRAPE) ×3 IMPLANT
DRAPE IMP U-DRAPE 54X76 (DRAPES) ×3 IMPLANT
DRAPE OEC MINIVIEW 54X84 (DRAPES) IMPLANT
DRAPE SURG 17X23 STRL (DRAPES) ×3 IMPLANT
ELECT SMALL JOINT 90D BASC (ELECTRODE) IMPLANT
GAUZE SPONGE 4X4 12PLY STRL (GAUZE/BANDAGES/DRESSINGS) ×3 IMPLANT
GAUZE XEROFORM 1X8 LF (GAUZE/BANDAGES/DRESSINGS) ×3 IMPLANT
GLOVE BIO SURGEON STRL SZ7.5 (GLOVE) ×3 IMPLANT
GLOVE BIOGEL PI IND STRL 8.5 (GLOVE) ×1 IMPLANT
GLOVE BIOGEL PI INDICATOR 8.5 (GLOVE) ×2
GLOVE SURG ORTHO 8.0 STRL STRW (GLOVE) ×6 IMPLANT
GOWN STRL REUS W/ TWL LRG LVL3 (GOWN DISPOSABLE) ×1 IMPLANT
GOWN STRL REUS W/TWL LRG LVL3 (GOWN DISPOSABLE) ×2
GOWN STRL REUS W/TWL XL LVL3 (GOWN DISPOSABLE) ×6 IMPLANT
IV NS IRRIG 3000ML ARTHROMATIC (IV SOLUTION) ×3 IMPLANT
IV SET EXT 30 76VOL 4 MALE LL (IV SETS) ×3 IMPLANT
MANIFOLD NEPTUNE II (INSTRUMENTS) IMPLANT
NDL SAFETY ECLIPSE 18X1.5 (NEEDLE) ×1 IMPLANT
NEEDLE HYPO 18GX1.5 SHARP (NEEDLE) ×2
NEEDLE HYPO 22GX1.5 SAFETY (NEEDLE) ×6 IMPLANT
NEEDLE SPNL 18GX3.5 QUINCKE PK (NEEDLE) IMPLANT
NEEDLE TUOHY 20GX3.5 (NEEDLE) IMPLANT
NS IRRIG 1000ML POUR BTL (IV SOLUTION) IMPLANT
PACK BASIN DAY SURGERY FS (CUSTOM PROCEDURE TRAY) ×3 IMPLANT
PAD CAST 3X4 CTTN HI CHSV (CAST SUPPLIES) ×1 IMPLANT
PADDING CAST ABS 3INX4YD NS (CAST SUPPLIES) ×2
PADDING CAST ABS 4INX4YD NS (CAST SUPPLIES) ×2
PADDING CAST ABS COTTON 3X4 (CAST SUPPLIES) ×1 IMPLANT
PADDING CAST ABS COTTON 4X4 ST (CAST SUPPLIES) ×1 IMPLANT
PADDING CAST COTTON 3X4 STRL (CAST SUPPLIES) ×2
PROBE BIPOLAR ARTHRO 85MM 30D (MISCELLANEOUS) IMPLANT
ROUTER HOODED VORTEX 2.9MM (BLADE) IMPLANT
SET ARTHROSCOPY TUBING (MISCELLANEOUS) ×2
SET ARTHROSCOPY TUBING LN (MISCELLANEOUS) ×1 IMPLANT
SET SM JOINT TUBING/CANN (CANNULA) IMPLANT
SLEEVE SCD COMPRESS KNEE MED (MISCELLANEOUS) ×3 IMPLANT
SPLINT PLASTER CAST XFAST 3X15 (CAST SUPPLIES) ×10 IMPLANT
SPLINT PLASTER XTRA FASTSET 3X (CAST SUPPLIES) ×20
STOCKINETTE 4X48 STRL (DRAPES) ×3 IMPLANT
SUCTION FRAZIER HANDLE 10FR (MISCELLANEOUS)
SUCTION TUBE FRAZIER 10FR DISP (MISCELLANEOUS) IMPLANT
SUT ETHILON 4 0 PS 2 18 (SUTURE) ×3 IMPLANT
SUT MERSILENE 4 0 P 3 (SUTURE) IMPLANT
SUT PDS AB 2-0 CT2 27 (SUTURE) IMPLANT
SUT STEEL 4 0 (SUTURE) IMPLANT
SUT VIC AB 2-0 PS2 27 (SUTURE) IMPLANT
SUT VICRYL 4-0 PS2 18IN ABS (SUTURE) IMPLANT
SYR BULB 3OZ (MISCELLANEOUS) ×3 IMPLANT
SYR CONTROL 10ML LL (SYRINGE) ×3 IMPLANT
TUBE CONNECTING 20'X1/4 (TUBING)
TUBE CONNECTING 20X1/4 (TUBING) IMPLANT
UNDERPAD 30X30 (UNDERPADS AND DIAPERS) IMPLANT
WATER STERILE IRR 1000ML POUR (IV SOLUTION) ×3 IMPLANT

## 2017-01-26 NOTE — Op Note (Signed)
[id number] 

## 2017-01-26 NOTE — Anesthesia Procedure Notes (Signed)
Anesthesia Regional Block: Supraclavicular block   Pre-Anesthetic Checklist: ,, timeout performed, Correct Patient, Correct Site, Correct Laterality, Correct Procedure, Correct Position, site marked, Risks and benefits discussed,  Surgical consent,  Pre-op evaluation,  At surgeon's request and post-op pain management  Laterality: Left  Prep: Dura Prep       Needles:  Injection technique: Single-shot  Needle Type: Stimiplex     Needle Length: 9cm  Needle Gauge: 21     Additional Needles:   Procedures: ultrasound guided,,,,,,,,  Narrative:  Start time: 01/26/2017 8:20 AM End time: 01/26/2017 8:25 AM Injection made incrementally with aspirations every 5 mL.  Performed by: Personally  Anesthesiologist: Candida Peeling RAY

## 2017-01-26 NOTE — H&P (Signed)
Adam Holloway is an 46 y.o. male.   Chief Complaint: left wrist pain HPI: Adam Holloway is a 46 year old right-hand-dominant male referred by Dr. Durward Fortes for consultation regarding pain in his left wrist. This a dull toothache type pain with a VAS score 9/10. Radiates across the entire wrist but is centrally located. This been going on for since November 2017. He recalls no history of injury. He is been taking hydrocodone for this. He complains of a squeezing type pain in his wrist. He is not complaining of any numbness or tingling. He has no history of injury. He has a history of arthritis no history of diabetes thyroid problems or gout. Family history is positive arthritis negative for diabetes thyroid problems and gout. Nothing seems to make it better or worse. He has been wearing a splint. He is referred for CT scan. He complains of a dull aching pain in his left wrist with a VAS score 9 over 10. He has foreign material in his left wrist. He was sent for CT scan to see if this was inside his lunate bone. His CT scan is reviewed revealing 2 foreign bodies one in the distal aspect of his radius and the second in the lunate with a large cyst. There is some collapse of his lunate. The scapholunate ligament complex. CT scan is read out by Dr. Pollyann Kennedy Laboratory work including uric acid CBC and sed               Past Medical History:  Diagnosis Date  . Arthritis    back, left wrist, left knee  . Complication of anesthesia    bp dropped during gallbladder surgery in 2008  . Depression   . Family history of adverse reaction to anesthesia    mom stopped breathing during colonscopy  . GERD (gastroesophageal reflux disease)    h/o  . HA (headache)    h/o migrianes  . Herniated disc, cervical   . History of kidney stones    h/o stones  . Lumbago   . Pain in soft tissues of limb   . PTSD (post-traumatic stress disorder)   . Scoliosis     Past Surgical History:  Procedure Laterality  Date  . CHOLECYSTECTOMY  2008  . COLONOSCOPY  2010  . COLONOSCOPY WITH PROPOFOL N/A 06/09/2016   Procedure: COLONOSCOPY WITH PROPOFOL;  Surgeon: Christene Lye, MD;  Location: [facility];  Service: Endoscopy;  Laterality: N/A;  . FISSURECTOMY N/A 06/29/2016   Procedure: FISSURECTOMY;  Surgeon: Christene Lye, MD;  Location: [facility];  Service: General;  Laterality: N/A;  . HEMORRHOID SURGERY N/A 06/29/2016   Procedure: HEMORRHOIDECTOMY;  Surgeon: Christene Lye, MD;  Location: [facility];  Service: General;  Laterality: N/A;  . [place]  . SPHINCTEROTOMY N/A 06/29/2016   Procedure: SPHINCTEROTOMY;  Surgeon: Christene Lye, MD;  Location: [facility];  Service: General;  Laterality: N/A;    Family History  Problem Relation Age of Onset  . Cancer Father   . Heart Problems Father   . Lung cancer Father   . Colon cancer Mother    Social History:  reports that he has been smoking Cigarettes.  He has a 32.00 pack-year smoking history. He has quit using smokeless tobacco. He reports that he does not drink alcohol or use drugs.  Allergies:  Allergies  Allergen Reactions  . Augmentin [Amoxicillin-Pot Clavulanate] Anaphylaxis  . Nsaids Other (See Comments)    Chest pain    No  prescriptions prior to admission.    No results found for this or any previous visit (from the past 48 hour(s)).  No results found.   Pertinent items are noted in HPI.  Height 6' (1.829 m), weight 94.3 kg (208 lb).  General appearance: alert, cooperative and appears stated age Head: Normocephalic, without obvious abnormality Neck: no JVD Resp: clear to auscultation bilaterally Cardio: regular rate and rhythm, S1, S2 normal, no murmur, click, rub or gallop GI: soft, non-tender; bowel sounds normal; no masses,  no organomegaly Extremities: left wrist pain Pulses: 2+ and symmetric Skin: Skin color, texture, turgor normal. No rashes or lesions Neurologic:  Grossly normal Incision/Wound: na  Assessment/Plan  Assessment:  1. Post-traumatic osteoarthritis of left hand  2. Foreign body in hand, left   Plan: Have reviewed his x-rays with him. We recommend an arthroscopy of his wrist and enough to evaluate the cartilage damage his which has occurred. We have some idea as to what reconstructive procedures we may have available to [place]. I do not necessarily feel we are going to be able to see the foreign bodies. Possible that we may be able see the foreign body in the distal radius. His are encouraged and answered. He is advised that there is no guarantee with surgery the possibility of infection recurrence injury to arteries nerves. He is advised this primarily diagnostic in nature.      Charnetta Wulff R 01/26/2017, 5:46 AM

## 2017-01-26 NOTE — Discharge Instructions (Addendum)

## 2017-01-26 NOTE — Anesthesia Postprocedure Evaluation (Signed)
Anesthesia Post Note  Patient: Adam Holloway  Procedure(s) Performed: Procedure(s) (LRB): ARTHROSCOPY LEFT WRIST DEBRIDEMENT (Left)  Patient location during evaluation: PACU Anesthesia Type: Regional Level of consciousness: awake and alert Pain management: pain level controlled Vital Signs Assessment: post-procedure vital signs reviewed and stable Respiratory status: spontaneous breathing, nonlabored ventilation and respiratory function stable Cardiovascular status: stable and blood pressure returned to baseline Anesthetic complications: no       Last Vitals:  Vitals:   01/26/17 0954 01/26/17 1005  BP: 103/80 110/72  Pulse: 70 64  Resp: 17 17  Temp:  36.4 C    Last Pain:  Vitals:   01/26/17 1005  TempSrc: Oral  PainSc:                  Lynda Rainwater

## 2017-01-26 NOTE — Anesthesia Procedure Notes (Signed)
Procedure Name: MAC Date/Time: 01/26/2017 8:35 AM Performed by: Marrianne Mood Pre-anesthesia Checklist: Patient identified, Timeout performed, Emergency Drugs available, Suction available and Patient being monitored Patient Re-evaluated:Patient Re-evaluated prior to inductionOxygen Delivery Method: Circle system utilized, Simple face mask, Non-rebreather mask, Nasal cannula and Ambu bag Preoxygenation: Pre-oxygenation with 100% oxygen

## 2017-01-26 NOTE — Brief Op Note (Signed)
01/26/2017  9:38 AM  PATIENT:  Adam Holloway  46 y.o. male  PRE-OPERATIVE DIAGNOSIS:  FOREIGN BODY LUNATE LEFT WRIST  POST-OPERATIVE DIAGNOSIS:  FOREIGN BODY LUNATE LEFT WRIST  PROCEDURE:  Procedure(s): ARTHROSCOPY LEFT WRIST DEBRIDEMENT (Left)  SURGEON:  Surgeon(s) and Role:    * Daryll Brod, MD - Primary  PHYSICIAN ASSISTANT:   ASSISTANTS: none   ANESTHESIA:   regional and IV sedation  EBL:  Total I/O In: 1000 [I.V.:1000] Out: -   BLOOD ADMINISTERED:none  DRAINS: none   LOCAL MEDICATIONS USED:  NONE  SPECIMEN:  No Specimen  DISPOSITION OF SPECIMEN:  N/A  COUNTS:  YES  TOURNIQUET:    DICTATION: .Other Dictation: Dictation Number [id number]  PLAN OF CARE: Discharge to home after PACU  PATIENT DISPOSITION:  PACU - hemodynamically stable.

## 2017-01-26 NOTE — Op Note (Deleted)
  The note originally documented on this encounter has been moved the the encounter in which it belongs.  

## 2017-01-26 NOTE — Progress Notes (Signed)
Assisted Dr. Miller with left, ultrasound guided, supraclavicular block. Side rails up, monitors on throughout procedure. See vital signs in flow sheet. Tolerated Procedure well. 

## 2017-01-26 NOTE — Anesthesia Procedure Notes (Deleted)
Anesthesia Regional Block: Narrative:   Events:,,,,,,,,,,, other event

## 2017-01-26 NOTE — Transfer of Care (Signed)
Immediate Anesthesia Transfer of Care Note  Patient: Adam Holloway  Procedure(s) Performed: Procedure(s): ARTHROSCOPY LEFT WRIST DEBRIDEMENT (Left)  Patient Location: PACU  Anesthesia Type:MAC combined with regional for post-op pain  Level of Consciousness: awake and patient cooperative  Airway & Oxygen Therapy: Patient Spontanous Breathing and Patient connected to face mask oxygen  Post-op Assessment: Report given to RN and Post -op Vital signs reviewed and stable  Post vital signs: Reviewed and stable  Last Vitals:  Vitals:   01/26/17 0820 01/26/17 0825  BP: 120/79   Pulse: 75 76  Resp: 13 15  Temp:      Last Pain:  Vitals:   01/26/17 0739  TempSrc: Oral         Complications: No apparent anesthesia complications

## 2017-01-26 NOTE — Op Note (Signed)
Adam Holloway, Adam Holloway NO.:  [id number]  MEDICAL RECORD NO.:  [id number]  LOCATION:                                 FACILITY:  PHYSICIAN:  Daryll Brod, M.D.       DATE OF BIRTH:  1971/09/01  DATE OF PROCEDURE:  01/26/2017 DATE OF DISCHARGE:  01/26/2017                              OPERATIVE REPORT   PREOPERATIVE DIAGNOSIS:  Foreign body, lunate left wrist; distal radius, left arm.  POSTOPERATIVE DIAGNOSIS:  Foreign body, lunate left wrist; distal radius, left arm and arthritis of the radiocarpal joint.  OPERATION:  Arthroscopy with debridement, left wrist.  SURGEON:  Daryll Brod, MD.  ANESTHESIA:  Supraclavicular block.  ANESTHESIOLOGISTSabra Heck.  PLACE OF SURGERY:  [facility].  HISTORY:  The patient is a 46 year old male with a history of left wrist pain, not responsive to conservative treatment.  X-rays reveal a piece of metal, which appears to be in his lunate.  This is confirmed by CT scan.  There was a large cyst about the foreign body.  There was also a small foreign body in his distal radius.  He has no history of injury. He has no idea how the metal got there.  He was admitted for arthroscopy of his wrist in an effort to evaluate cartilage integrity with debridement as dictated by findings.  He is aware that there is no guarantee to the surgery; the possibility of infection; recurrence of injury to arteries, nerves, and tendons; incomplete relief of symptoms. The necessity of further operations.  In preoperative area, the patient was seen, the extremity marked by both patient and surgeon.  Antibiotic given.  A supraclavicular block was carried out in the preoperative area under the direction of Dr. Sabra Heck.  DESCRIPTION OF PROCEDURE:  He was brought to the operating room, where he was prepped and draped using ChloraPrep in supine position with left arm free.  A 3-minute dry time was allowed, time-out taken, confirming the patient and  procedure.  IV sedation was also given.  The left arm was placed in the Arc arthroscopy tower, 10 pounds of traction applied. The joint was inflated through the 3-4 portal.  Transverse incision was made, deepened with a hemostat and a blunt trocar was used to enter the joint.  The joint was inspected and showed significant fibrillation of cartilage and loose bodies within the joint.  Damage to the distal articular surface of the radius was noted with cartilage flaps being present.  An irrigation catheter was placed in 6 view.  A 4-5 portal was opened.  With some difficulty, the scope was able to be brought over to the ulnar side.  Significant damage to the proximal aspect of the lunate was noted.  Significant damage to the lunate facet was also noted.  The scapholunate ligament was intact, but stretched.  There was articular damage on the distal scaphoid facet of the radius and on the proximal aspect of the scaphoid in addition.  The scope was then introduced into the 4-5 portal.  The triangular fibrocartilage appeared intact.  The volar ligaments were intact both radially and ulnarly.  The entire lunate facet was arthritic.  The  proximal aspect of the lunate was entirely denuded of cartilage.  Bone was exposed over 90% of the surface of the lunate.  A full radius and gator shaver were then introduced and debridement of loose cartilage was then performed to allow visualization.  This revealed that there was no cartilage on the proximal aspect of the lunate and the lunate facet of the distal radius. It was decided not to proceed with any further attempt to remove foreign bodies, which were not noted.  There was entire bone across the proximal aspect of the lunate.  The midcarpal joint was then inspected.  There was no cartilage damage on the distal aspect of the lunate or the proximal aspect of the capitate and hamate.  It was noted the triangular fibrocartilage complex showed no changes.   A very significant synovitis was present throughout the proximal row.  There was minimal synovitis in the middle midcarpal row.  Instruments were removed.  The portals closed with interrupted 4-0 nylon sutures.  A volar splint and dressing was applied.  Tourniquet was not inflated throughout the procedure.  The patient tolerated the procedure well and was taken to the recovery room for observation in satisfactory condition.  He will be discharged to home to return to the [facility] in 1 week, on [place].    ______________________________ Daryll Brod, M.D.   ______________________________ Daryll Brod, M.D.    GK/MEDQ  D:  01/26/2017  T:  01/26/2017  Job:  [id number]

## 2017-01-26 NOTE — Anesthesia Preprocedure Evaluation (Signed)
Anesthesia Evaluation  Patient identified by MRN, date of birth, ID band Patient awake    Reviewed: Allergy & Precautions, NPO status , Patient's Chart, lab work & pertinent test results  History of Anesthesia Complications (+) Family history of anesthesia reaction  Airway Mallampati: III  TM Distance: >3 FB    Comment: overhang Dental  (+) Teeth Intact   Pulmonary neg pulmonary ROS, Current Smoker,    breath sounds clear to auscultation       Cardiovascular Exercise Tolerance: Good  Rhythm:Regular     Neuro/Psych  Headaches, Depression    GI/Hepatic negative GI ROS, Neg liver ROS, GERD  Medicated,  Endo/Other  negative endocrine ROS  Renal/GU negative Renal ROS  negative genitourinary   Musculoskeletal  (+) Arthritis , Osteoarthritis,    Abdominal Normal abdominal exam  (+)   Peds  Hematology negative hematology ROS (+)   Anesthesia Other Findings scoliosis  Reproductive/Obstetrics                             Anesthesia Physical  Anesthesia Plan  ASA: II  Anesthesia Plan: MAC and Regional   Post-op Pain Management:  Regional for Post-op pain   Induction: Intravenous  Airway Management Planned: Simple Face Mask  Additional Equipment:   Intra-op Plan:   Post-operative Plan:   Informed Consent: I have reviewed the patients History and Physical, chart, labs and discussed the procedure including the risks, benefits and alternatives for the proposed anesthesia with the patient or authorized representative who has indicated his/her understanding and acceptance.     Plan Discussed with: CRNA  Anesthesia Plan Comments:         Anesthesia Quick Evaluation

## 2017-01-27 ENCOUNTER — Encounter (HOSPITAL_BASED_OUTPATIENT_CLINIC_OR_DEPARTMENT_OTHER): Payer: Self-pay | Admitting: Orthopedic Surgery

## 2017-01-27 NOTE — Addendum Note (Signed)
Addendum  created 01/27/17 1436 by Belky Mundo, Ernesta Amble, CRNA   Charge Capture section accepted

## 2017-02-06 ENCOUNTER — Other Ambulatory Visit: Payer: Self-pay | Admitting: Physical Medicine & Rehabilitation

## 2017-03-04 DIAGNOSIS — M19039 Primary osteoarthritis, unspecified wrist: Secondary | ICD-10-CM | POA: Diagnosis not present

## 2017-03-04 DIAGNOSIS — K219 Gastro-esophageal reflux disease without esophagitis: Secondary | ICD-10-CM | POA: Diagnosis not present

## 2017-03-04 DIAGNOSIS — M4120 Other idiopathic scoliosis, site unspecified: Secondary | ICD-10-CM | POA: Diagnosis not present

## 2017-03-04 DIAGNOSIS — G5601 Carpal tunnel syndrome, right upper limb: Secondary | ICD-10-CM | POA: Diagnosis not present

## 2017-04-10 ENCOUNTER — Other Ambulatory Visit: Payer: Self-pay | Admitting: Physical Medicine & Rehabilitation

## 2017-05-07 DIAGNOSIS — M545 Low back pain: Secondary | ICD-10-CM | POA: Diagnosis not present

## 2017-05-07 DIAGNOSIS — M25532 Pain in left wrist: Secondary | ICD-10-CM | POA: Diagnosis not present

## 2017-05-07 DIAGNOSIS — M25552 Pain in left hip: Secondary | ICD-10-CM | POA: Diagnosis not present

## 2017-05-07 DIAGNOSIS — G894 Chronic pain syndrome: Secondary | ICD-10-CM | POA: Diagnosis not present

## 2017-05-07 DIAGNOSIS — M542 Cervicalgia: Secondary | ICD-10-CM | POA: Diagnosis not present

## 2017-05-13 DIAGNOSIS — M19039 Primary osteoarthritis, unspecified wrist: Secondary | ICD-10-CM | POA: Diagnosis not present

## 2017-05-13 DIAGNOSIS — K219 Gastro-esophageal reflux disease without esophagitis: Secondary | ICD-10-CM | POA: Diagnosis not present

## 2017-05-13 DIAGNOSIS — M4120 Other idiopathic scoliosis, site unspecified: Secondary | ICD-10-CM | POA: Diagnosis not present

## 2017-06-02 ENCOUNTER — Ambulatory Visit
Admission: RE | Admit: 2017-06-02 | Discharge: 2017-06-02 | Disposition: A | Discharge status: Home / Self Care | Payer: Medicare Other | Source: Ambulatory Visit | Attending: Internal Medicine | Admitting: Internal Medicine

## 2017-06-02 ENCOUNTER — Other Ambulatory Visit: Payer: Self-pay | Admitting: Internal Medicine

## 2017-06-02 ENCOUNTER — Other Ambulatory Visit: Payer: Self-pay | Admitting: Cardiology

## 2017-06-02 DIAGNOSIS — M5030 Other cervical disc degeneration, unspecified cervical region: Secondary | ICD-10-CM | POA: Diagnosis not present

## 2017-06-02 DIAGNOSIS — M542 Cervicalgia: Secondary | ICD-10-CM

## 2017-06-03 DIAGNOSIS — M545 Low back pain: Secondary | ICD-10-CM | POA: Diagnosis not present

## 2017-06-03 DIAGNOSIS — G894 Chronic pain syndrome: Secondary | ICD-10-CM | POA: Diagnosis not present

## 2017-06-03 DIAGNOSIS — M542 Cervicalgia: Secondary | ICD-10-CM | POA: Diagnosis not present

## 2017-06-04 DIAGNOSIS — M4120 Other idiopathic scoliosis, site unspecified: Secondary | ICD-10-CM | POA: Diagnosis not present

## 2017-06-04 DIAGNOSIS — G5601 Carpal tunnel syndrome, right upper limb: Secondary | ICD-10-CM | POA: Diagnosis not present

## 2017-06-04 DIAGNOSIS — M19039 Primary osteoarthritis, unspecified wrist: Secondary | ICD-10-CM | POA: Diagnosis not present

## 2017-06-04 DIAGNOSIS — K219 Gastro-esophageal reflux disease without esophagitis: Secondary | ICD-10-CM | POA: Diagnosis not present

## 2017-07-06 DIAGNOSIS — G894 Chronic pain syndrome: Secondary | ICD-10-CM | POA: Diagnosis not present

## 2017-07-06 DIAGNOSIS — M542 Cervicalgia: Secondary | ICD-10-CM | POA: Diagnosis not present

## 2017-07-06 DIAGNOSIS — M545 Low back pain: Secondary | ICD-10-CM | POA: Diagnosis not present

## 2017-08-09 DIAGNOSIS — M25532 Pain in left wrist: Secondary | ICD-10-CM | POA: Diagnosis not present

## 2017-08-09 DIAGNOSIS — M545 Low back pain: Secondary | ICD-10-CM | POA: Diagnosis not present

## 2017-08-09 DIAGNOSIS — G894 Chronic pain syndrome: Secondary | ICD-10-CM | POA: Diagnosis not present

## 2017-08-09 DIAGNOSIS — M542 Cervicalgia: Secondary | ICD-10-CM | POA: Diagnosis not present

## 2017-09-02 DIAGNOSIS — M4120 Other idiopathic scoliosis, site unspecified: Secondary | ICD-10-CM | POA: Diagnosis not present

## 2017-09-02 DIAGNOSIS — K219 Gastro-esophageal reflux disease without esophagitis: Secondary | ICD-10-CM | POA: Diagnosis not present

## 2017-09-02 DIAGNOSIS — G5601 Carpal tunnel syndrome, right upper limb: Secondary | ICD-10-CM | POA: Diagnosis not present

## 2017-09-02 DIAGNOSIS — M19039 Primary osteoarthritis, unspecified wrist: Secondary | ICD-10-CM | POA: Diagnosis not present

## 2017-09-10 DIAGNOSIS — G894 Chronic pain syndrome: Secondary | ICD-10-CM | POA: Diagnosis not present

## 2017-09-10 DIAGNOSIS — M25532 Pain in left wrist: Secondary | ICD-10-CM | POA: Diagnosis not present

## 2017-09-10 DIAGNOSIS — M545 Low back pain: Secondary | ICD-10-CM | POA: Diagnosis not present

## 2017-09-10 DIAGNOSIS — M542 Cervicalgia: Secondary | ICD-10-CM | POA: Diagnosis not present

## 2017-09-14 HISTORY — PX: WRIST SURGERY: SHX841

## 2017-10-08 DIAGNOSIS — M542 Cervicalgia: Secondary | ICD-10-CM | POA: Diagnosis not present

## 2017-10-08 DIAGNOSIS — G894 Chronic pain syndrome: Secondary | ICD-10-CM | POA: Diagnosis not present

## 2017-10-08 DIAGNOSIS — Z79891 Long term (current) use of opiate analgesic: Secondary | ICD-10-CM | POA: Diagnosis not present

## 2017-10-08 DIAGNOSIS — M545 Low back pain: Secondary | ICD-10-CM | POA: Diagnosis not present

## 2017-10-08 DIAGNOSIS — M25532 Pain in left wrist: Secondary | ICD-10-CM | POA: Diagnosis not present

## 2017-11-05 DIAGNOSIS — Z79891 Long term (current) use of opiate analgesic: Secondary | ICD-10-CM | POA: Diagnosis not present

## 2017-11-05 DIAGNOSIS — G894 Chronic pain syndrome: Secondary | ICD-10-CM | POA: Diagnosis not present

## 2017-11-05 DIAGNOSIS — M542 Cervicalgia: Secondary | ICD-10-CM | POA: Diagnosis not present

## 2017-11-05 DIAGNOSIS — M25532 Pain in left wrist: Secondary | ICD-10-CM | POA: Diagnosis not present

## 2017-11-05 DIAGNOSIS — M545 Low back pain: Secondary | ICD-10-CM | POA: Diagnosis not present

## 2017-12-02 DIAGNOSIS — I471 Supraventricular tachycardia: Secondary | ICD-10-CM | POA: Diagnosis not present

## 2017-12-02 DIAGNOSIS — M4120 Other idiopathic scoliosis, site unspecified: Secondary | ICD-10-CM | POA: Diagnosis not present

## 2017-12-02 DIAGNOSIS — M5412 Radiculopathy, cervical region: Secondary | ICD-10-CM | POA: Diagnosis not present

## 2017-12-02 DIAGNOSIS — J209 Acute bronchitis, unspecified: Secondary | ICD-10-CM | POA: Diagnosis not present

## 2017-12-03 DIAGNOSIS — G894 Chronic pain syndrome: Secondary | ICD-10-CM | POA: Diagnosis not present

## 2017-12-03 DIAGNOSIS — M542 Cervicalgia: Secondary | ICD-10-CM | POA: Diagnosis not present

## 2017-12-03 DIAGNOSIS — Z79891 Long term (current) use of opiate analgesic: Secondary | ICD-10-CM | POA: Diagnosis not present

## 2017-12-03 DIAGNOSIS — M25532 Pain in left wrist: Secondary | ICD-10-CM | POA: Diagnosis not present

## 2017-12-03 DIAGNOSIS — M545 Low back pain: Secondary | ICD-10-CM | POA: Diagnosis not present

## 2017-12-22 DIAGNOSIS — M5412 Radiculopathy, cervical region: Secondary | ICD-10-CM | POA: Diagnosis not present

## 2017-12-22 DIAGNOSIS — G5601 Carpal tunnel syndrome, right upper limb: Secondary | ICD-10-CM | POA: Diagnosis not present

## 2017-12-22 DIAGNOSIS — I471 Supraventricular tachycardia: Secondary | ICD-10-CM | POA: Diagnosis not present

## 2017-12-22 DIAGNOSIS — M4120 Other idiopathic scoliosis, site unspecified: Secondary | ICD-10-CM | POA: Diagnosis not present

## 2017-12-29 DIAGNOSIS — G894 Chronic pain syndrome: Secondary | ICD-10-CM | POA: Diagnosis not present

## 2017-12-29 DIAGNOSIS — M25532 Pain in left wrist: Secondary | ICD-10-CM | POA: Diagnosis not present

## 2017-12-29 DIAGNOSIS — M542 Cervicalgia: Secondary | ICD-10-CM | POA: Diagnosis not present

## 2017-12-29 DIAGNOSIS — M545 Low back pain: Secondary | ICD-10-CM | POA: Diagnosis not present

## 2017-12-29 DIAGNOSIS — Z79891 Long term (current) use of opiate analgesic: Secondary | ICD-10-CM | POA: Diagnosis not present

## 2018-01-28 DIAGNOSIS — M25532 Pain in left wrist: Secondary | ICD-10-CM | POA: Diagnosis not present

## 2018-01-28 DIAGNOSIS — M545 Low back pain: Secondary | ICD-10-CM | POA: Diagnosis not present

## 2018-01-28 DIAGNOSIS — Z79891 Long term (current) use of opiate analgesic: Secondary | ICD-10-CM | POA: Diagnosis not present

## 2018-01-28 DIAGNOSIS — M542 Cervicalgia: Secondary | ICD-10-CM | POA: Diagnosis not present

## 2018-01-28 DIAGNOSIS — G894 Chronic pain syndrome: Secondary | ICD-10-CM | POA: Diagnosis not present

## 2018-02-28 DIAGNOSIS — R51 Headache: Secondary | ICD-10-CM | POA: Diagnosis not present

## 2018-02-28 DIAGNOSIS — G894 Chronic pain syndrome: Secondary | ICD-10-CM | POA: Diagnosis not present

## 2018-02-28 DIAGNOSIS — M545 Low back pain: Secondary | ICD-10-CM | POA: Diagnosis not present

## 2018-02-28 DIAGNOSIS — M25532 Pain in left wrist: Secondary | ICD-10-CM | POA: Diagnosis not present

## 2018-02-28 DIAGNOSIS — M542 Cervicalgia: Secondary | ICD-10-CM | POA: Diagnosis not present

## 2018-02-28 DIAGNOSIS — Z79891 Long term (current) use of opiate analgesic: Secondary | ICD-10-CM | POA: Diagnosis not present

## 2018-03-03 DIAGNOSIS — M4120 Other idiopathic scoliosis, site unspecified: Secondary | ICD-10-CM | POA: Diagnosis not present

## 2018-03-03 DIAGNOSIS — R5381 Other malaise: Secondary | ICD-10-CM | POA: Diagnosis not present

## 2018-03-03 DIAGNOSIS — E7849 Other hyperlipidemia: Secondary | ICD-10-CM | POA: Diagnosis not present

## 2018-03-03 DIAGNOSIS — I10 Essential (primary) hypertension: Secondary | ICD-10-CM | POA: Diagnosis not present

## 2018-03-03 DIAGNOSIS — Z87891 Personal history of nicotine dependence: Secondary | ICD-10-CM | POA: Diagnosis not present

## 2018-03-03 DIAGNOSIS — M7711 Lateral epicondylitis, right elbow: Secondary | ICD-10-CM | POA: Diagnosis not present

## 2018-03-03 DIAGNOSIS — G5700 Lesion of sciatic nerve, unspecified lower limb: Secondary | ICD-10-CM | POA: Diagnosis not present

## 2018-03-04 ENCOUNTER — Other Ambulatory Visit: Payer: Self-pay | Admitting: Internal Medicine

## 2018-03-04 DIAGNOSIS — R221 Localized swelling, mass and lump, neck: Secondary | ICD-10-CM

## 2018-03-04 DIAGNOSIS — M542 Cervicalgia: Secondary | ICD-10-CM

## 2018-03-08 ENCOUNTER — Ambulatory Visit: Admission: RE | Admit: 2018-03-08 | Payer: Medicare Other | Source: Ambulatory Visit

## 2018-03-15 DIAGNOSIS — E669 Obesity, unspecified: Secondary | ICD-10-CM | POA: Diagnosis not present

## 2018-03-15 DIAGNOSIS — M5412 Radiculopathy, cervical region: Secondary | ICD-10-CM | POA: Diagnosis not present

## 2018-03-15 DIAGNOSIS — I471 Supraventricular tachycardia: Secondary | ICD-10-CM | POA: Diagnosis not present

## 2018-03-15 DIAGNOSIS — K219 Gastro-esophageal reflux disease without esophagitis: Secondary | ICD-10-CM | POA: Diagnosis not present

## 2018-03-22 ENCOUNTER — Ambulatory Visit
Admission: RE | Admit: 2018-03-22 | Discharge: 2018-03-22 | Disposition: A | Discharge status: Home / Self Care | Payer: Medicare Other | Source: Ambulatory Visit | Attending: Internal Medicine | Admitting: Internal Medicine

## 2018-03-22 DIAGNOSIS — R221 Localized swelling, mass and lump, neck: Secondary | ICD-10-CM

## 2018-03-22 DIAGNOSIS — D490 Neoplasm of unspecified behavior of digestive system: Secondary | ICD-10-CM | POA: Diagnosis not present

## 2018-03-22 DIAGNOSIS — Z9889 Other specified postprocedural states: Secondary | ICD-10-CM | POA: Insufficient documentation

## 2018-03-22 DIAGNOSIS — M7989 Other specified soft tissue disorders: Secondary | ICD-10-CM | POA: Insufficient documentation

## 2018-03-22 DIAGNOSIS — M542 Cervicalgia: Secondary | ICD-10-CM | POA: Diagnosis present

## 2018-03-29 DIAGNOSIS — M5412 Radiculopathy, cervical region: Secondary | ICD-10-CM | POA: Diagnosis not present

## 2018-03-29 DIAGNOSIS — I471 Supraventricular tachycardia: Secondary | ICD-10-CM | POA: Diagnosis not present

## 2018-03-29 DIAGNOSIS — E669 Obesity, unspecified: Secondary | ICD-10-CM | POA: Diagnosis not present

## 2018-03-29 DIAGNOSIS — K219 Gastro-esophageal reflux disease without esophagitis: Secondary | ICD-10-CM | POA: Diagnosis not present

## 2018-04-04 DIAGNOSIS — M542 Cervicalgia: Secondary | ICD-10-CM | POA: Diagnosis not present

## 2018-04-04 DIAGNOSIS — Z79891 Long term (current) use of opiate analgesic: Secondary | ICD-10-CM | POA: Diagnosis not present

## 2018-04-04 DIAGNOSIS — M545 Low back pain: Secondary | ICD-10-CM | POA: Diagnosis not present

## 2018-04-04 DIAGNOSIS — M25532 Pain in left wrist: Secondary | ICD-10-CM | POA: Diagnosis not present

## 2018-04-04 DIAGNOSIS — G894 Chronic pain syndrome: Secondary | ICD-10-CM | POA: Diagnosis not present

## 2018-04-04 DIAGNOSIS — R51 Headache: Secondary | ICD-10-CM | POA: Diagnosis not present

## 2018-05-02 DIAGNOSIS — R51 Headache: Secondary | ICD-10-CM | POA: Diagnosis not present

## 2018-05-02 DIAGNOSIS — M542 Cervicalgia: Secondary | ICD-10-CM | POA: Diagnosis not present

## 2018-05-02 DIAGNOSIS — M545 Low back pain: Secondary | ICD-10-CM | POA: Diagnosis not present

## 2018-05-02 DIAGNOSIS — Z79891 Long term (current) use of opiate analgesic: Secondary | ICD-10-CM | POA: Diagnosis not present

## 2018-05-02 DIAGNOSIS — G894 Chronic pain syndrome: Secondary | ICD-10-CM | POA: Diagnosis not present

## 2018-05-02 DIAGNOSIS — M25532 Pain in left wrist: Secondary | ICD-10-CM | POA: Diagnosis not present

## 2018-05-30 DIAGNOSIS — R51 Headache: Secondary | ICD-10-CM | POA: Diagnosis not present

## 2018-05-30 DIAGNOSIS — Z79891 Long term (current) use of opiate analgesic: Secondary | ICD-10-CM | POA: Diagnosis not present

## 2018-05-30 DIAGNOSIS — M25532 Pain in left wrist: Secondary | ICD-10-CM | POA: Diagnosis not present

## 2018-05-30 DIAGNOSIS — G894 Chronic pain syndrome: Secondary | ICD-10-CM | POA: Diagnosis not present

## 2018-05-30 DIAGNOSIS — M545 Low back pain: Secondary | ICD-10-CM | POA: Diagnosis not present

## 2018-05-30 DIAGNOSIS — M542 Cervicalgia: Secondary | ICD-10-CM | POA: Diagnosis not present

## 2018-06-01 DIAGNOSIS — Z23 Encounter for immunization: Secondary | ICD-10-CM | POA: Diagnosis not present

## 2018-06-16 DIAGNOSIS — L570 Actinic keratosis: Secondary | ICD-10-CM | POA: Diagnosis not present

## 2018-06-16 DIAGNOSIS — L80 Vitiligo: Secondary | ICD-10-CM | POA: Diagnosis not present

## 2018-06-23 ENCOUNTER — Ambulatory Visit (INDEPENDENT_AMBULATORY_CARE_PROVIDER_SITE_OTHER): Payer: Medicare Other | Admitting: Neurology

## 2018-06-23 ENCOUNTER — Encounter: Payer: Self-pay | Admitting: Neurology

## 2018-06-23 VITALS — BP 121/81 | HR 78 | Ht 72.0 in | Wt 240.0 lb

## 2018-06-23 DIAGNOSIS — M5481 Occipital neuralgia: Secondary | ICD-10-CM | POA: Diagnosis not present

## 2018-06-23 MED ORDER — GABAPENTIN 300 MG PO CAPS: 300.0000 mg | ORAL_CAPSULE | Freq: Two times a day (BID) | ORAL | 3 refills | Status: DC

## 2018-06-23 NOTE — Procedures (Signed)
     History: Adam Holloway is a 47 year old gentleman who reports left occipital discomfort and left shoulder pain.  The patient is felt to have some irritation of the left occipital nerve associated with spasm in the left neck.   Trigger point injections and nerve blocks were done using Sensorcaine 0.5%.  0.5 cc injections were done at the midpoint between the occipital protrusion and mastoid process with 2 other injections done superiorly to this at the 10:00 and 2:00 positions.  Injections were done on the left occipital area at the base of the skull, and an injection was done at the mid cervical area and upper shoulder.  The patient tolerated the procedure well.  No complications of the procedure were noted.  Sensorcaine 0.5%, NDC number is 63323- 4 6 7-5 7  Lot number is [id number]  Expiration date is September 2022.

## 2018-06-23 NOTE — Patient Instructions (Signed)
Begin baclofen 10 mg taking 1/2 tablet twice a day. Start gabapentin 300 mg twice a day.

## 2018-06-23 NOTE — Progress Notes (Signed)
Reason for visit: Left sided neck pain, headache  Referring physician: Dr. Harlow Adam Holloway is a 47 y.o. male  History of present illness:  [name] is a 47 year old right-handed white male with a history of chronic back pain and left-sided sciatica.  The patient is followed through a pain center for this, he is on 5 Percocet daily, he takes very low-dose gabapentin.  The patient developed some problems with left occipital pain in the summer 2018, but this eventually went away.  The patient had recurrence of the same symptoms 4 months ago, and the pain has been more persistent.  The patient has an earache type pain in the left occipital area at the base of the skull, he will have more sharp pain into the back of the head and behind the ear when he turns his head to the left.  The patient has some discomfort down into the left shoulder.  The patient reports no tingling or numbness in the back of the head, he occasionally may have some pain referred behind the left eye.  The patient denies issues on the right side.  He denies pain that shoots down the arm.  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  The patient denies any numbness or weakness of the arms or legs, he denies balance issues, he denies difficulty controlling the bowels or the bladder.  He is sent to this office for an evaluation.  Past Medical History:  Diagnosis Date  . Arthritis    back, left wrist, left knee  . Complication of anesthesia    bp dropped during gallbladder surgery in 2008  . Depression   . Family history of adverse reaction to anesthesia    mom stopped breathing during colonscopy  . GERD (gastroesophageal reflux disease)    h/o  . HA (headache)    h/o migrianes  . Herniated disc, cervical   . History of kidney stones    h/o stones  . Lumbago   . Pain in  soft tissues of limb   . PTSD (post-traumatic stress disorder)   . Scoliosis     Past Surgical History:  Procedure Laterality Date  . CHOLECYSTECTOMY  2008  . COLONOSCOPY  2010  . COLONOSCOPY WITH PROPOFOL N/A 06/09/2016   Procedure: COLONOSCOPY WITH PROPOFOL;  Surgeon: Christene Lye, MD;  Location: [facility];  Service: Endoscopy;  Laterality: N/A;  . FISSURECTOMY N/A 06/29/2016   Procedure: FISSURECTOMY;  Surgeon: Christene Lye, MD;  Location: [facility];  Service: General;  Laterality: N/A;  . HEMORRHOID SURGERY N/A 06/29/2016   Procedure: HEMORRHOIDECTOMY;  Surgeon: Christene Lye, MD;  Location: [facility];  Service: General;  Laterality: N/A;  . PAROTIDECTOMY Left 1997  . SALIVARY GLAND SURGERY  1997  . SPHINCTEROTOMY N/A 06/29/2016   Procedure: SPHINCTEROTOMY;  Surgeon: Christene Lye, MD;  Location: [facility];  Service: General;  Laterality: N/A;  . WRIST ARTHROSCOPY Left 01/26/2017   Procedure: ARTHROSCOPY LEFT WRIST DEBRIDEMENT;  Surgeon: Daryll Brod, MD;  Location: [place];  Service: Orthopedics;  Laterality: Left;  . WRIST SURGERY  2019    Family History  Problem Relation Age of Onset  . Cancer Father   . Heart Problems Father   . Lung cancer Father   . Colon cancer Mother  Social history:  reports that he has been smoking cigarettes. He has a 16.00 pack-year smoking history. He has quit using smokeless tobacco. He reports that he does not drink alcohol or use drugs.  Medications:  Prior to Admission medications   Medication Sig Start Date End Date Taking? Authorizing Provider  clonazePAM (KLONOPIN) 0.5 MG tablet Take 0.5 mg by mouth 2 (two) times daily.   Yes [provider]  fluticasone furoate-vilanterol (BREO ELLIPTA) 100-25 MCG/INH AEPB Inhale 1 puff into the lungs daily.   Yes [provider]  gabapentin (NEURONTIN) 100 MG capsule Take 100 mg by mouth 2 (two) times daily.   Yes [provider]  oxyCODONE-acetaminophen (PERCOCET/ROXICET) 5-325 MG tablet Take 1 tablet by mouth 5 (five) times daily. 05/30/18  Yes [provider]  traMADol (ULTRAM) 50 MG tablet Take 1 tablet (50 mg total) by mouth every 6 (six) hours as needed. 06/29/16  Yes Christene Lye, MD      Allergies  Allergen Reactions  . Augmentin [Amoxicillin-Pot Clavulanate] Anaphylaxis  . Hydrocodone-Acetaminophen Shortness Of Breath  . Nsaids Other (See Comments)    Chest pain  . Tolmetin Other (See Comments)    Chest pain  . Meloxicam Rash    ROS:  Out of a complete 14 system review of symptoms, the patient complains only of the following symptoms, and all other reviewed systems are negative.  Weight gain Ringing in the ears Joint pain Depression, anxiety, none of sleep, decreased energy  Height 6' (1.829 m), weight 240 lb (108.9 kg).  Physical Exam  General: The patient is alert and cooperative at the time of the examination.  Eyes: Pupils are equal, round, and reactive to light. Discs are flat bilaterally.  Neck: The neck is supple, no carotid bruits are noted.  Respiratory: The respiratory examination is clear.  Cardiovascular: The cardiovascular examination reveals a regular rate and rhythm, no obvious murmurs or rubs are noted.  Neuromuscular: Range move the cervical spine is relatively full to the right, lacks about 20 or 30 degrees with turning to the left.  Skin: Extremities are without significant edema.  Neurologic Exam  Mental status: The patient is alert and oriented x 3 at the time of the examination. The patient has apparent normal recent and remote memory, with an apparently normal attention span and concentration ability.  Cranial nerves: Facial symmetry is present. There is good sensation of the face to pinprick and soft touch bilaterally. The strength of the facial muscles and the muscles to head turning and shoulder shrug are normal bilaterally.  Speech is well enunciated, no aphasia or dysarthria is noted. Extraocular movements are full. Visual fields are full. The tongue is midline, and the patient has symmetric elevation of the soft palate. No obvious hearing deficits are noted.  Motor: The motor testing reveals 5 over 5 strength of all 4 extremities. Good symmetric motor tone is noted throughout.  Sensory: Sensory testing is intact to pinprick, soft touch, vibration sensation, and position sense on all 4 extremities. No evidence of extinction is noted.  Coordination: Cerebellar testing reveals good finger-nose-finger and heel-to-shin bilaterally.  Gait and station: Gait is normal. Tandem gait is normal. Romberg is negative. No drift is seen.  Reflexes: Deep tendon reflexes are symmetric and normal bilaterally. Toes are downgoing bilaterally.   Assessment/Plan:  1.  Left neck pain, left occipital neuralgia  The patient appears to have some spasm of the left neck, he likely is irritating the left occipital nerve with  associated pain in the back of the head.  The patient will go up on the gabapentin dose taking 300 mg twice daily and he claims that he has baclofen at home, 10 mg tablets.  He will start baclofen taking 5 mg twice daily.  He will be set up for physical therapy for neuromuscular therapy.  He received Marcaine injections in the back of the head and left neck today.  He will follow-up in 3 or 4 months.  He claims that the use of prednisone previously was not helpful for the pain.  Jill Alexanders MD 06/23/2018 8:40 AM  [facility] [address] [place], [place] [address]  Phone [phone] Fax [phone]

## 2018-06-29 DIAGNOSIS — M545 Low back pain: Secondary | ICD-10-CM | POA: Diagnosis not present

## 2018-06-29 DIAGNOSIS — M25532 Pain in left wrist: Secondary | ICD-10-CM | POA: Diagnosis not present

## 2018-06-29 DIAGNOSIS — G894 Chronic pain syndrome: Secondary | ICD-10-CM | POA: Diagnosis not present

## 2018-06-29 DIAGNOSIS — Z79891 Long term (current) use of opiate analgesic: Secondary | ICD-10-CM | POA: Diagnosis not present

## 2018-06-29 DIAGNOSIS — R51 Headache: Secondary | ICD-10-CM | POA: Diagnosis not present

## 2018-06-29 DIAGNOSIS — M542 Cervicalgia: Secondary | ICD-10-CM | POA: Diagnosis not present

## 2018-07-27 DIAGNOSIS — R51 Headache: Secondary | ICD-10-CM | POA: Diagnosis not present

## 2018-07-27 DIAGNOSIS — Z79891 Long term (current) use of opiate analgesic: Secondary | ICD-10-CM | POA: Diagnosis not present

## 2018-07-27 DIAGNOSIS — G894 Chronic pain syndrome: Secondary | ICD-10-CM | POA: Diagnosis not present

## 2018-07-27 DIAGNOSIS — M542 Cervicalgia: Secondary | ICD-10-CM | POA: Diagnosis not present

## 2018-07-27 DIAGNOSIS — M25532 Pain in left wrist: Secondary | ICD-10-CM | POA: Diagnosis not present

## 2018-07-27 DIAGNOSIS — M545 Low back pain: Secondary | ICD-10-CM | POA: Diagnosis not present

## 2018-07-28 DIAGNOSIS — K769 Liver disease, unspecified: Secondary | ICD-10-CM | POA: Diagnosis not present

## 2018-07-28 DIAGNOSIS — E669 Obesity, unspecified: Secondary | ICD-10-CM | POA: Diagnosis not present

## 2018-07-28 DIAGNOSIS — I471 Supraventricular tachycardia: Secondary | ICD-10-CM | POA: Diagnosis not present

## 2018-07-28 DIAGNOSIS — K219 Gastro-esophageal reflux disease without esophagitis: Secondary | ICD-10-CM | POA: Diagnosis not present

## 2018-07-28 DIAGNOSIS — Z0000 Encounter for general adult medical examination without abnormal findings: Secondary | ICD-10-CM | POA: Diagnosis not present

## 2018-08-17 DIAGNOSIS — L80 Vitiligo: Secondary | ICD-10-CM | POA: Diagnosis not present

## 2018-08-17 DIAGNOSIS — L700 Acne vulgaris: Secondary | ICD-10-CM | POA: Diagnosis not present

## 2018-08-24 DIAGNOSIS — G894 Chronic pain syndrome: Secondary | ICD-10-CM | POA: Diagnosis not present

## 2018-08-24 DIAGNOSIS — M542 Cervicalgia: Secondary | ICD-10-CM | POA: Diagnosis not present

## 2018-08-24 DIAGNOSIS — Z79891 Long term (current) use of opiate analgesic: Secondary | ICD-10-CM | POA: Diagnosis not present

## 2018-08-24 DIAGNOSIS — M545 Low back pain: Secondary | ICD-10-CM | POA: Diagnosis not present

## 2018-08-24 DIAGNOSIS — R51 Headache: Secondary | ICD-10-CM | POA: Diagnosis not present

## 2018-08-24 DIAGNOSIS — M25532 Pain in left wrist: Secondary | ICD-10-CM | POA: Diagnosis not present

## 2018-08-28 IMAGING — MR MR WRIST*L* W/O CM
6 of 7 series · 32 of 40 positions shown · non-contrast
Comparison: Plain films left wrist performed at [REDACTED] 12/07/2016.

CLINICAL DATA: Left wrist pain since July 2016. Decreased grip
strength. No known injury.

EXAM:
MR OF THE LEFT WRIST WITHOUT CONTRAST
TECHNIQUE: Multiplanar, multisequence MR imaging of the left wrist was
performed. No intravenous contrast was administered.

[Series 8: T2 fat-sat · axial · 3.0mm · 0.39mm/px · z∈[+16,+86]mm · 8 of 25 slices shown (1 of 2)]
[im 1/25]
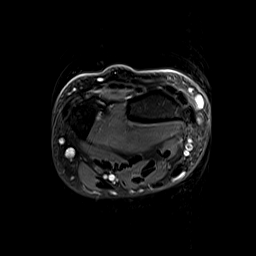
[im 4/25]
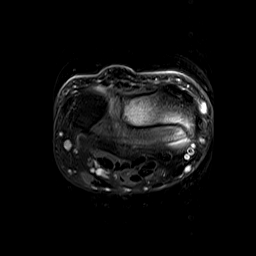
[im 7/25]
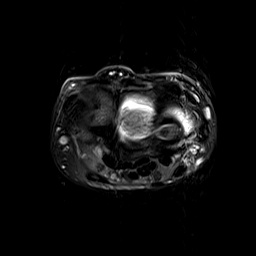
[im 11/25]
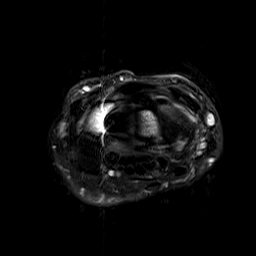
[im 14/25]
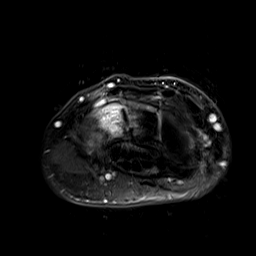
[im 18/25]
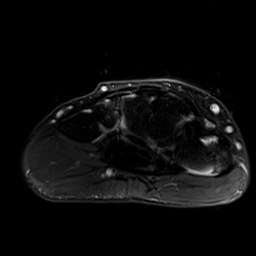
[im 21/25]
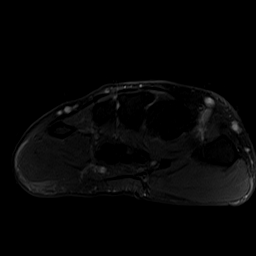
[im 25/25]
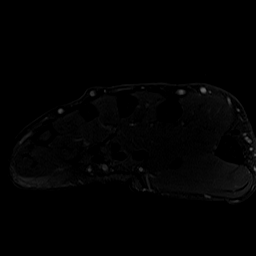

[Series 9: T1 · coronal · 3.0mm · 0.31mm/px · 5 of 17 slices shown]
[im 1/17]
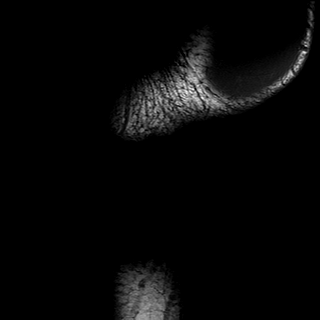
[im 5/17]
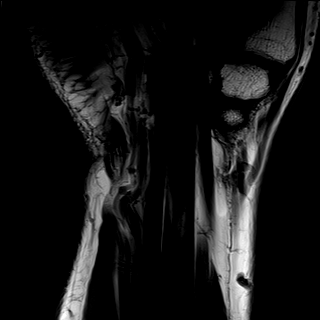
[im 9/17]
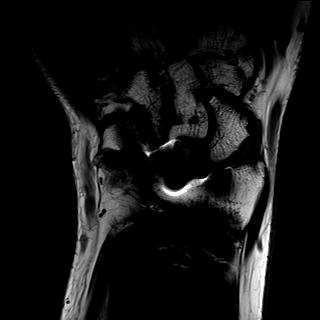
[im 13/17]
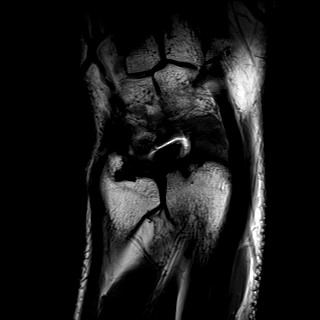
[im 17/17]
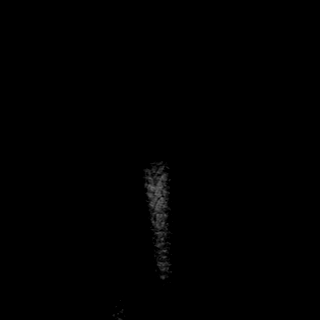

[Series 10: T2 fat-sat · coronal · 3.0mm · 0.39mm/px · 5 of 17 slices shown (2 of 2)]
[im 1/17]
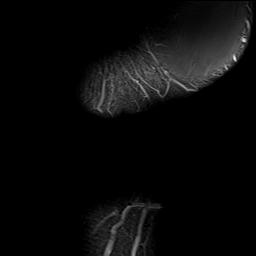
[im 5/17]
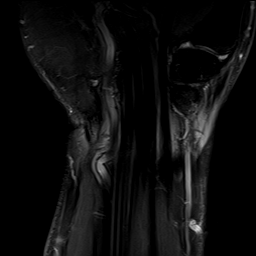
[im 9/17]
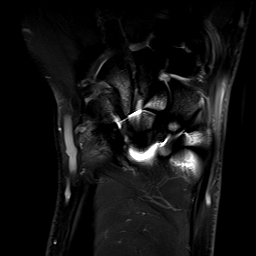
[im 13/17]
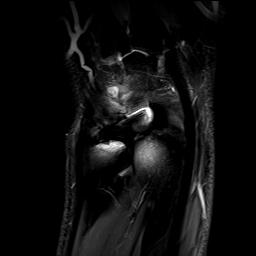
[im 17/17]
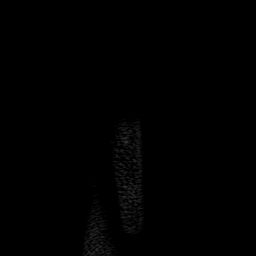

[Series 11: PD fat-sat · coronal · 3.0mm · 0.20mm/px · 5 of 17 slices shown (1 of 2)]
[im 1/17]
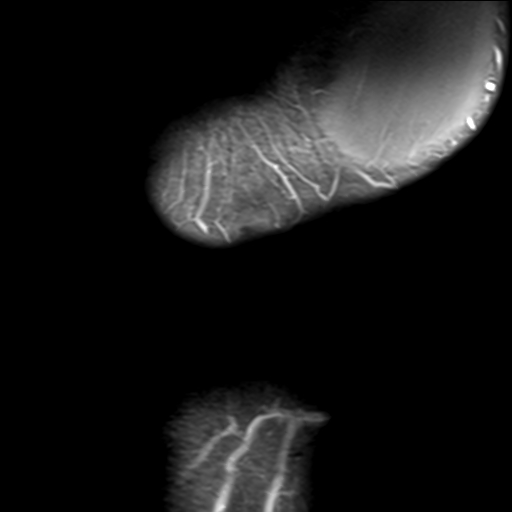
[im 5/17]
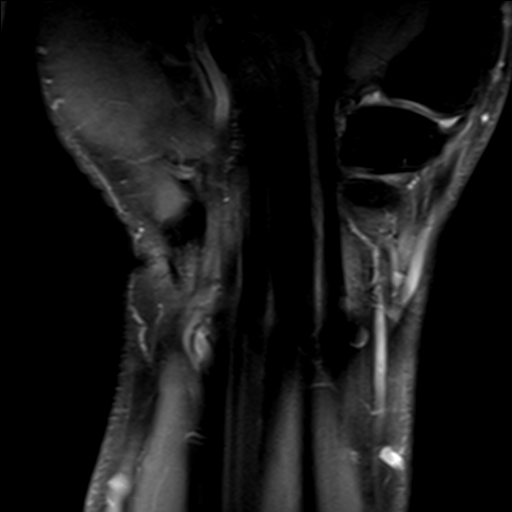
[im 9/17]
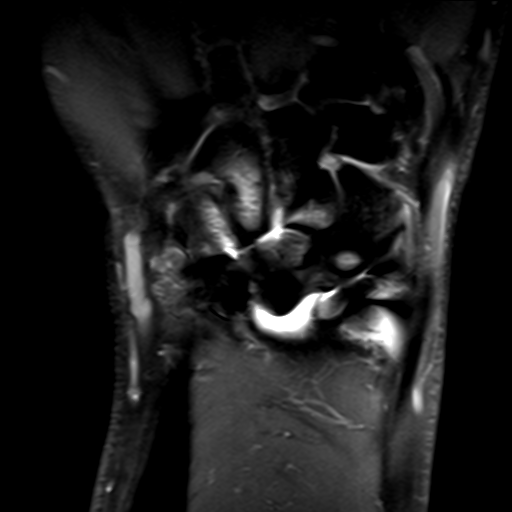
[im 13/17]
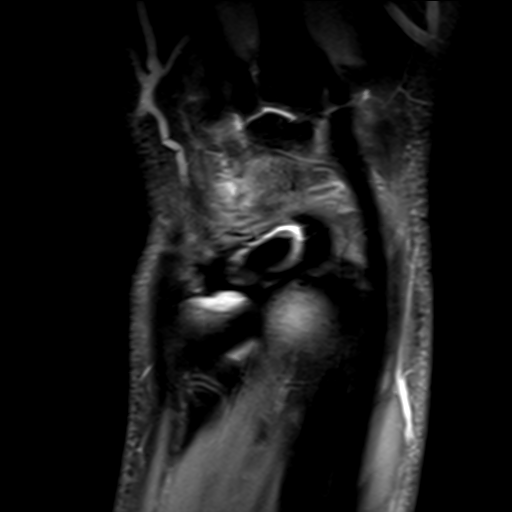
[im 17/17]
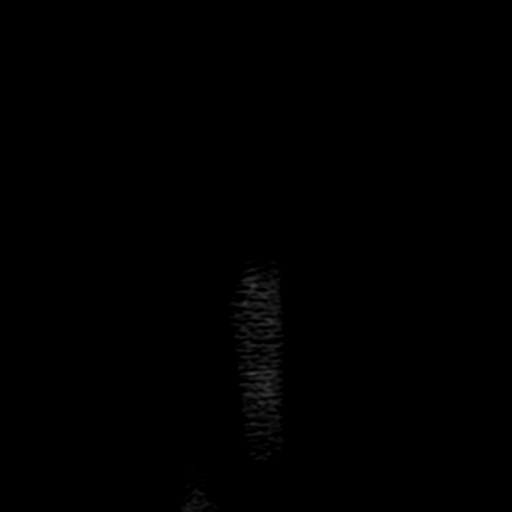

[Series 12: PD fat-sat · sagittal · 3.0mm · 0.18mm/px · 8 of 27 slices shown (2 of 2)]
[im 1/27]
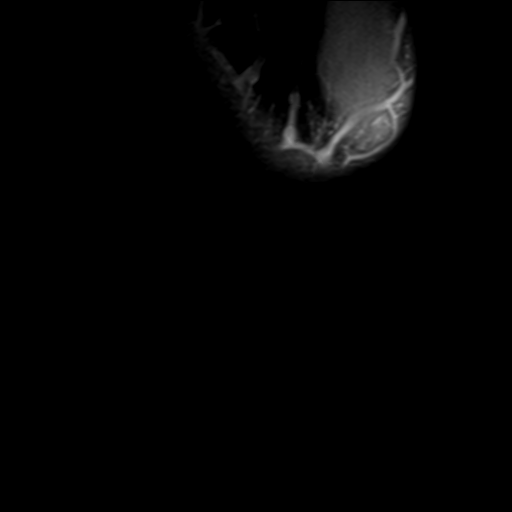
[im 4/27]
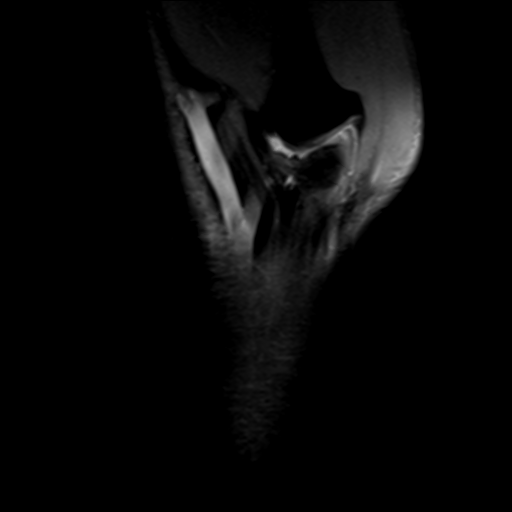
[im 8/27]
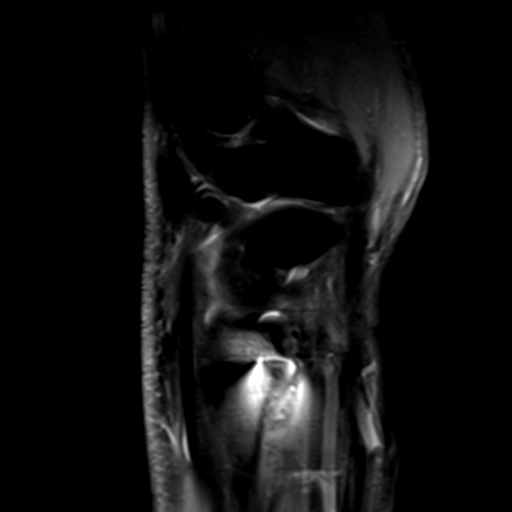
[im 12/27]
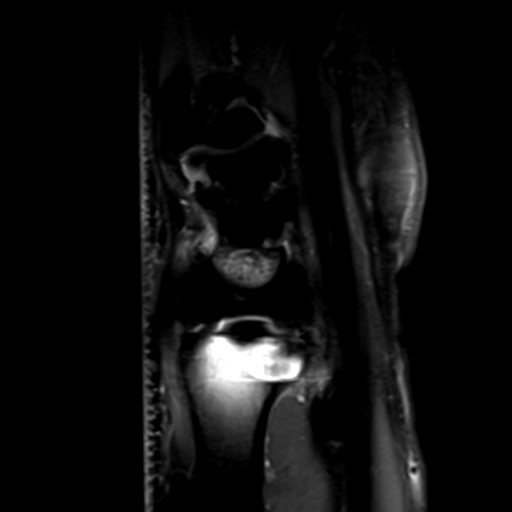
[im 15/27]
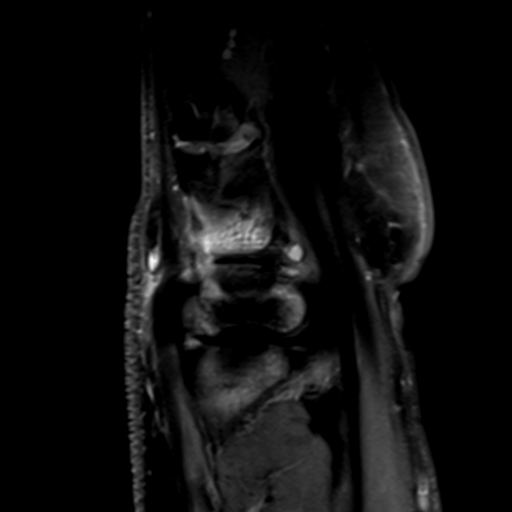
[im 19/27]
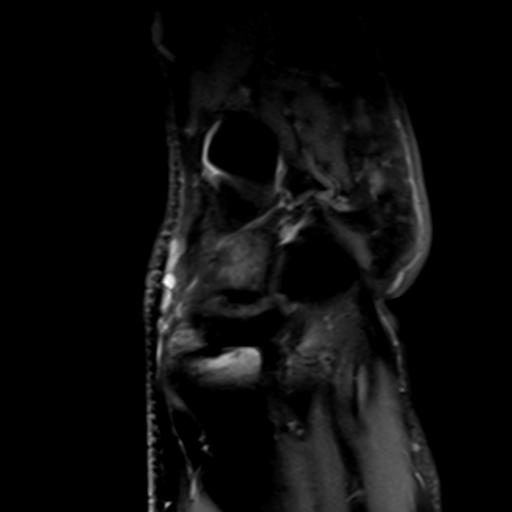
[im 23/27]
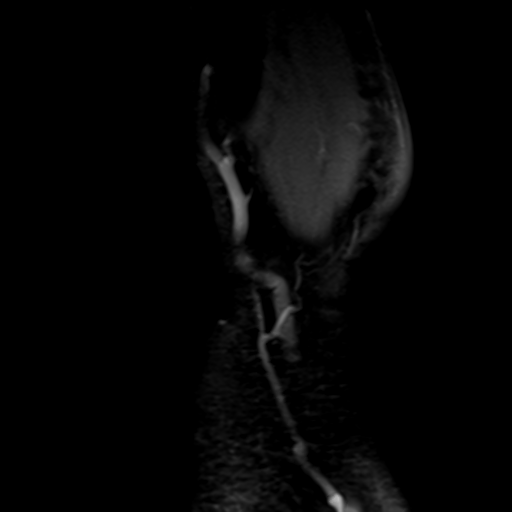
[im 27/27]
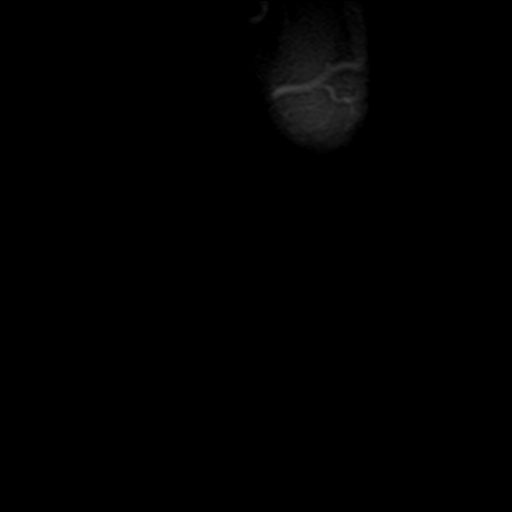

[Series 13: STIR · coronal · 3.0mm · 0.55mm/px · 1 of 17 slices shown]
[im 1/17]
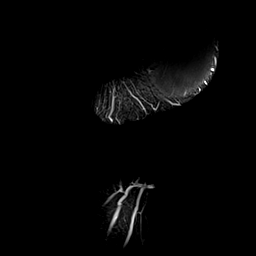

[32 of 40 positions shown; findings below may reference images not displayed]

FINDINGS: There is extensive susceptibility artifact on the examination
limiting the study. On the comparison plain films, there appears to
be a punctate metallic density projecting in the lunate.

Ligaments: Cannot be adequately assessed due to artifact.

Triangular fibrocartilage: Appears grossly intact although artifact
limits evaluation.

Tendons: Intact and normal in appearance.

Carpal tunnel/median nerve: Normal carpal tunnel. Normal median
nerve.

Guyon's canal: Normal.

Joint/cartilage: No joint effusion. No chondral defect.

Bones/carpal alignment: Bone marrow signal is unremarkable.

Other: None.
IMPRESSION: Limited study due to a punctate metallic foreign body projecting in
the lunate on prior plain films. No abnormality or finding to
explain the patient's symptoms is identified.

## 2018-10-26 NOTE — Progress Notes (Deleted)
[facility]  PATIENT: Adam Holloway DOB: 1971/05/07   REASON FOR VISIT: Follow-up for occipital neuralgia HISTORY FROM:    HISTORY OF PRESENT ILLNESS: 10/10/19KW Adam Holloway is a 48 year old right-handed white male with a history of chronic back pain and left-sided sciatica.  The patient is followed through a pain center for this, he is on 5 Percocet daily, he takes very low-dose gabapentin.  The patient developed some problems with left occipital pain in the summer 2018, but this eventually went away.  The patient had recurrence of the same symptoms 4 months ago, and the pain has been more persistent.  The patient has an earache type pain in the left occipital area at the base of the skull, he will have more sharp pain into the back of the head and behind the ear when he turns his head to the left.  The patient has some discomfort down into the left shoulder.  The patient reports no tingling or numbness in the back of the head, he occasionally may have some pain referred behind the left eye.  The patient denies issues on the right side.  He denies pain that shoots down the arm.  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  The patient denies any numbness or weakness of the arms or legs, he denies balance issues, he denies difficulty controlling the bowels or the bladder.  He is sent to this office for an evaluation.  REVIEW OF SYSTEMS: Full 14 system review of systems performed and notable only for those listed, all others are neg:  Constitutional: neg  Cardiovascular: neg Ear/Nose/Throat: neg  Skin: neg Eyes: neg Respiratory: neg Gastroitestinal: neg  Hematology/Lymphatic: neg  Endocrine: neg Musculoskeletal:neg Allergy/Immunology: neg Neurological: neg Psychiatric: neg Sleep : neg   ALLERGIES: Allergies  Allergen  Reactions  . Augmentin [Amoxicillin-Pot Clavulanate] Anaphylaxis  . Hydrocodone-Acetaminophen Shortness Of Breath  . Nsaids Other (See Comments)    Chest pain  . Tolmetin Other (See Comments)    Chest pain  . Meloxicam Rash    HOME MEDICATIONS: Outpatient Medications Prior to Visit  Medication Sig Dispense Refill  . clonazePAM (KLONOPIN) 0.5 MG tablet Take 0.5 mg by mouth 2 (two) times daily.    . fluticasone furoate-vilanterol (BREO ELLIPTA) 100-25 MCG/INH AEPB Inhale 1 puff into the lungs daily.    Marland Kitchen gabapentin (NEURONTIN) 300 MG capsule Take 1 capsule (300 mg total) by mouth 2 (two) times daily. 60 capsule 3  . oxyCODONE-acetaminophen (PERCOCET/ROXICET) 5-325 MG tablet Take 1 tablet by mouth 5 (five) times daily.  0  . traMADol (ULTRAM) 50 MG tablet Take 1 tablet (50 mg total) by mouth every 6 (six) hours as needed. 30 tablet 0   No facility-administered medications prior to visit.     PAST MEDICAL HISTORY: Past Medical History:  Diagnosis Date  . Arthritis    back, left wrist, left knee  . Complication of anesthesia    bp dropped during gallbladder surgery in 2008  . Depression   . Family history of adverse reaction to anesthesia    mom stopped breathing during colonscopy  . GERD (gastroesophageal reflux disease)    h/o  . HA (headache)    h/o migrianes  . Herniated disc, cervical   . History of kidney stones    h/o stones  . Lumbago   .  Pain in soft tissues of limb   . PTSD (post-traumatic stress disorder)   . Scoliosis     PAST SURGICAL HISTORY: Past Surgical History:  Procedure Laterality Date  . CHOLECYSTECTOMY  2008  . COLONOSCOPY  2010  . COLONOSCOPY WITH PROPOFOL N/A 06/09/2016   Procedure: COLONOSCOPY WITH PROPOFOL;  Surgeon: Christene Lye, MD;  Location: [facility];  Service: Endoscopy;  Laterality: N/A;  . FISSURECTOMY N/A 06/29/2016   Procedure: FISSURECTOMY;  Surgeon: Christene Lye, MD;  Location: [facility];  Service: General;   Laterality: N/A;  . HEMORRHOID SURGERY N/A 06/29/2016   Procedure: HEMORRHOIDECTOMY;  Surgeon: Christene Lye, MD;  Location: [facility];  Service: General;  Laterality: N/A;  . PAROTIDECTOMY Left 1997  . SALIVARY GLAND SURGERY  1997  . SPHINCTEROTOMY N/A 06/29/2016   Procedure: SPHINCTEROTOMY;  Surgeon: Christene Lye, MD;  Location: [facility];  Service: General;  Laterality: N/A;  . WRIST ARTHROSCOPY Left 01/26/2017   Procedure: ARTHROSCOPY LEFT WRIST DEBRIDEMENT;  Surgeon: Daryll Brod, MD;  Location: [place];  Service: Orthopedics;  Laterality: Left;  . WRIST SURGERY  2019    FAMILY HISTORY: Family History  Problem Relation Age of Onset  . Cancer Father   . Heart Problems Father   . Lung cancer Father   . Colon cancer Mother     SOCIAL HISTORY: Social History   Socioeconomic History  . Marital status: Single    Spouse name: Not on file  . Number of children: 1  . Years of education: GED  . Highest education level: Not on file  Occupational History    Comment: Disabled  Social Needs  . Financial resource strain: Not on file  . Food insecurity:    Worry: Not on file    Inability: Not on file  . Transportation needs:    Medical: Not on file    Non-medical: Not on file  Tobacco Use  . Smoking status: Current Every Day Smoker    Packs/day: 0.50    Years: 32.00    Pack years: 16.00    Types: Cigarettes  . Smokeless tobacco: Former Systems developer  . Tobacco comment: Quit 2014  Substance and Sexual Activity  . Alcohol use: No    Alcohol/week: 0.0 standard drinks  . Drug use: No  . Sexual activity: Not on file  Lifestyle  . Physical activity:    Days per week: Not on file    Minutes per session: Not on file  . Stress: Not on file  Relationships  . Social connections:    Talks on phone: Not on file    Gets together: Not on file    Attends religious service: Not on file    Active member of club or organization: Not on file    Attends meetings  of clubs or organizations: Not on file    Relationship status: Not on file  . Intimate partner violence:    Fear of current or ex partner: Not on file    Emotionally abused: Not on file    Physically abused: Not on file    Forced sexual activity: Not on file  Other Topics Concern  . Not on file  Social History Narrative   Patient lives at home with his mother.   Disabled.   Education GED.   Right handed.   Caffeine two cups of coffee daily.     PHYSICAL EXAM  There were no vitals filed for this visit. There is no height  or weight on file to calculate BMI.  Generalized: Well developed, in no acute distress  Head: normocephalic and atraumatic,. Oropharynx benign  Neck: Supple, no carotid bruits  Cardiac: Regular rate rhythm, no murmur  Musculoskeletal: No deformity   Neurological examination   Mentation: Alert oriented to time, place, history taking. Attention span and concentration appropriate. Recent and remote memory intact.  Follows all commands speech and language fluent.   Cranial nerve II-XII: Fundoscopic exam reveals sharp disc margins.Pupils were equal round reactive to light extraocular movements were full, visual field were full on confrontational test. Facial sensation and strength were normal. hearing was intact to finger rubbing bilaterally. Uvula tongue midline. head turning and shoulder shrug were normal and symmetric.Tongue protrusion into cheek strength was normal. Motor: normal bulk and tone, full strength in the BUE, BLE, fine finger movements normal, no pronator drift. No focal weakness Sensory: normal and symmetric to light touch, pinprick, and  Vibration, proprioception  Coordination: finger-nose-finger, heel-to-shin bilaterally, no dysmetria Reflexes: Brachioradialis 2/2, biceps 2/2, triceps 2/2, patellar 2/2, Achilles 2/2, plantar responses were flexor bilaterally. Gait and Station: Rising up from seated position without assistance, normal stance,   moderate stride, good arm swing, smooth turning, able to perform tiptoe, and heel walking without difficulty. Tandem gait is steady  DIAGNOSTIC DATA (LABS, IMAGING, TESTING) - I reviewed patient records, labs, notes, testing and imaging myself where available.  Lab Results  Component Value Date   WBC 7.7 01/07/2017   HGB 14.6 01/07/2017   HCT 43.5 01/07/2017   MCV 90.1 01/07/2017   PLT 140 (L) 01/07/2017   No results found for: NA, K, CL, CO2, GLUCOSE, BUN, CREATININE, CALCIUM, PROT, ALBUMIN, AST, ALT, ALKPHOS, BILITOT, GFRNONAA, GFRAA No results found for: CHOL, HDL, LDLCALC, LDLDIRECT, TRIG, CHOLHDL No results found for: HGBA1C No results found for: VITAMINB12 No results found for: TSH  ***  ASSESSMENT AND PLAN  48 y.o. year old male  has a past medical history of Arthritis, Complication of anesthesia, Depression, Family history of adverse reaction to anesthesia, GERD (gastroesophageal reflux disease), HA (headache), Herniated disc, cervical, History of kidney stones, Lumbago, Pain in soft tissues of limb, PTSD (post-traumatic stress disorder), and Scoliosis. here with ***  Left neck pain, left occipital neuralgia  The patient appears to have some spasm of the left neck, he likely is irritating the left occipital nerve with associated pain in the back of the head.  The patient will go up on the gabapentin dose taking 300 mg twice daily and he claims that he has baclofen at home, 10 mg tablets.  He will start baclofen taking 5 mg twice daily.  He will be set up for physical therapy for neuromuscular therapy.  He received Marcaine injections in the back of the head and left neck today.  He will follow-up in 3 or 4 months.  He claims that the use of prednisone previously was not helpful for the pain  Dennie Bible, [facility], [facility], APRN  [facility] [address] [place], [place] [address] [phone]

## 2018-10-27 ENCOUNTER — Ambulatory Visit: Payer: Self-pay | Admitting: Nurse Practitioner

## 2018-10-31 ENCOUNTER — Encounter: Payer: Self-pay | Admitting: Nurse Practitioner

## 2018-10-31 NOTE — Progress Notes (Deleted)
[facility]  PATIENT: Adam Holloway DOB: 09-05-1971   REASON FOR VISIT: Follow-up for occipital neuralgia HISTORY FROM:    HISTORY OF PRESENT ILLNESS: 10/10/19KW Mr. Aber is a 48 year old right-handed white male with a history of chronic back pain and left-sided sciatica.  The patient is followed through a pain center for this, he is on 5 Percocet daily, he takes very low-dose gabapentin.  The patient developed some problems with left occipital pain in the summer 2018, but this eventually went away.  The patient had recurrence of the same symptoms 4 months ago, and the pain has been more persistent.  The patient has an earache type pain in the left occipital area at the base of the skull, he will have more sharp pain into the back of the head and behind the ear when he turns his head to the left.  The patient has some discomfort down into the left shoulder.  The patient reports no tingling or numbness in the back of the head, he occasionally may have some pain referred behind the left eye.  The patient denies issues on the right side.  He denies pain that shoots down the arm.  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  The patient denies any numbness or weakness of the arms or legs, he denies balance issues, he denies difficulty controlling the bowels or the bladder.  He is sent to this office for an evaluation.  REVIEW OF SYSTEMS: Full 14 system review of systems performed and notable only for those listed, all others are neg:  Constitutional: neg  Cardiovascular: neg Ear/Nose/Throat: neg  Skin: neg Eyes: neg Respiratory: neg Gastroitestinal: neg  Hematology/Lymphatic: neg  Endocrine: neg Musculoskeletal:neg Allergy/Immunology: neg Neurological: neg Psychiatric: neg Sleep : neg   ALLERGIES: Allergies  Allergen  Reactions  . Augmentin [Amoxicillin-Pot Clavulanate] Anaphylaxis  . Hydrocodone-Acetaminophen Shortness Of Breath  . Nsaids Other (See Comments)    Chest pain  . Tolmetin Other (See Comments)    Chest pain  . Meloxicam Rash    HOME MEDICATIONS: Outpatient Medications Prior to Visit  Medication Sig Dispense Refill  . clonazePAM (KLONOPIN) 0.5 MG tablet Take 0.5 mg by mouth 2 (two) times daily.    . fluticasone furoate-vilanterol (BREO ELLIPTA) 100-25 MCG/INH AEPB Inhale 1 puff into the lungs daily.    Marland Kitchen gabapentin (NEURONTIN) 300 MG capsule Take 1 capsule (300 mg total) by mouth 2 (two) times daily. 60 capsule 3  . oxyCODONE-acetaminophen (PERCOCET/ROXICET) 5-325 MG tablet Take 1 tablet by mouth 5 (five) times daily.  0  . traMADol (ULTRAM) 50 MG tablet Take 1 tablet (50 mg total) by mouth every 6 (six) hours as needed. 30 tablet 0   No facility-administered medications prior to visit.     PAST MEDICAL HISTORY: Past Medical History:  Diagnosis Date  . Arthritis    back, left wrist, left knee  . Complication of anesthesia    bp dropped during gallbladder surgery in 2008  . Depression   . Family history of adverse reaction to anesthesia    mom stopped breathing during colonscopy  . GERD (gastroesophageal reflux disease)    h/o  . HA (headache)    h/o migrianes  . Herniated disc, cervical   . History of kidney stones    h/o stones  . Lumbago   .  Pain in soft tissues of limb   . PTSD (post-traumatic stress disorder)   . Scoliosis     PAST SURGICAL HISTORY: Past Surgical History:  Procedure Laterality Date  . CHOLECYSTECTOMY  2008  . COLONOSCOPY  2010  . COLONOSCOPY WITH PROPOFOL N/A 06/09/2016   Procedure: COLONOSCOPY WITH PROPOFOL;  Surgeon: Christene Lye, MD;  Location: [facility];  Service: Endoscopy;  Laterality: N/A;  . FISSURECTOMY N/A 06/29/2016   Procedure: FISSURECTOMY;  Surgeon: Christene Lye, MD;  Location: [facility];  Service: General;   Laterality: N/A;  . HEMORRHOID SURGERY N/A 06/29/2016   Procedure: HEMORRHOIDECTOMY;  Surgeon: Christene Lye, MD;  Location: [facility];  Service: General;  Laterality: N/A;  . PAROTIDECTOMY Left 1997  . SALIVARY GLAND SURGERY  1997  . SPHINCTEROTOMY N/A 06/29/2016   Procedure: SPHINCTEROTOMY;  Surgeon: Christene Lye, MD;  Location: [facility];  Service: General;  Laterality: N/A;  . WRIST ARTHROSCOPY Left 01/26/2017   Procedure: ARTHROSCOPY LEFT WRIST DEBRIDEMENT;  Surgeon: Daryll Brod, MD;  Location: [place];  Service: Orthopedics;  Laterality: Left;  . WRIST SURGERY  2019    FAMILY HISTORY: Family History  Problem Relation Age of Onset  . Cancer Father   . Heart Problems Father   . Lung cancer Father   . Colon cancer Mother     SOCIAL HISTORY: Social History   Socioeconomic History  . Marital status: Single    Spouse name: Not on file  . Number of children: 1  . Years of education: GED  . Highest education level: Not on file  Occupational History    Comment: Disabled  Social Needs  . Financial resource strain: Not on file  . Food insecurity:    Worry: Not on file    Inability: Not on file  . Transportation needs:    Medical: Not on file    Non-medical: Not on file  Tobacco Use  . Smoking status: Current Every Day Smoker    Packs/day: 0.50    Years: 32.00    Pack years: 16.00    Types: Cigarettes  . Smokeless tobacco: Former Systems developer  . Tobacco comment: Quit 2014  Substance and Sexual Activity  . Alcohol use: No    Alcohol/week: 0.0 standard drinks  . Drug use: No  . Sexual activity: Not on file  Lifestyle  . Physical activity:    Days per week: Not on file    Minutes per session: Not on file  . Stress: Not on file  Relationships  . Social connections:    Talks on phone: Not on file    Gets together: Not on file    Attends religious service: Not on file    Active member of club or organization: Not on file    Attends meetings  of clubs or organizations: Not on file    Relationship status: Not on file  . Intimate partner violence:    Fear of current or ex partner: Not on file    Emotionally abused: Not on file    Physically abused: Not on file    Forced sexual activity: Not on file  Other Topics Concern  . Not on file  Social History Narrative   Patient lives at home with his mother.   Disabled.   Education GED.   Right handed.   Caffeine two cups of coffee daily.     PHYSICAL EXAM  There were no vitals filed for this visit. There is no height  or weight on file to calculate BMI.  Generalized: Well developed, in no acute distress  Head: normocephalic and atraumatic,. Oropharynx benign  Neck: Supple, no carotid bruits  Cardiac: Regular rate rhythm, no murmur  Musculoskeletal: No deformity   Neurological examination   Mentation: Alert oriented to time, place, history taking. Attention span and concentration appropriate. Recent and remote memory intact.  Follows all commands speech and language fluent.   Cranial nerve II-XII: Fundoscopic exam reveals sharp disc margins.Pupils were equal round reactive to light extraocular movements were full, visual field were full on confrontational test. Facial sensation and strength were normal. hearing was intact to finger rubbing bilaterally. Uvula tongue midline. head turning and shoulder shrug were normal and symmetric.Tongue protrusion into cheek strength was normal. Motor: normal bulk and tone, full strength in the BUE, BLE, fine finger movements normal, no pronator drift. No focal weakness Sensory: normal and symmetric to light touch, pinprick, and  Vibration, proprioception  Coordination: finger-nose-finger, heel-to-shin bilaterally, no dysmetria Reflexes: Brachioradialis 2/2, biceps 2/2, triceps 2/2, patellar 2/2, Achilles 2/2, plantar responses were flexor bilaterally. Gait and Station: Rising up from seated position without assistance, normal stance,   moderate stride, good arm swing, smooth turning, able to perform tiptoe, and heel walking without difficulty. Tandem gait is steady  DIAGNOSTIC DATA (LABS, IMAGING, TESTING) - I reviewed patient records, labs, notes, testing and imaging myself where available.  Lab Results  Component Value Date   WBC 7.7 01/07/2017   HGB 14.6 01/07/2017   HCT 43.5 01/07/2017   MCV 90.1 01/07/2017   PLT 140 (L) 01/07/2017   No results found for: NA, K, CL, CO2, GLUCOSE, BUN, CREATININE, CALCIUM, PROT, ALBUMIN, AST, ALT, ALKPHOS, BILITOT, GFRNONAA, GFRAA No results found for: CHOL, HDL, LDLCALC, LDLDIRECT, TRIG, CHOLHDL No results found for: HGBA1C No results found for: VITAMINB12 No results found for: TSH  ***  ASSESSMENT AND PLAN  48 y.o. year old male  has a past medical history of Arthritis, Complication of anesthesia, Depression, Family history of adverse reaction to anesthesia, GERD (gastroesophageal reflux disease), HA (headache), Herniated disc, cervical, History of kidney stones, Lumbago, Pain in soft tissues of limb, PTSD (post-traumatic stress disorder), and Scoliosis. here with ***  Left neck pain, left occipital neuralgia  The patient appears to have some spasm of the left neck, he likely is irritating the left occipital nerve with associated pain in the back of the head.  The patient will go up on the gabapentin dose taking 300 mg twice daily and he claims that he has baclofen at home, 10 mg tablets.  He will start baclofen taking 5 mg twice daily.  He will be set up for physical therapy for neuromuscular therapy.  He received Marcaine injections in the back of the head and left neck today.  He will follow-up in 3 or 4 months.  He claims that the use of prednisone previously was not helpful for the pain  Dennie Bible, [facility], [facility], APRN  [facility] [address], [place], [place] [address] [phone]

## 2018-11-01 ENCOUNTER — Ambulatory Visit: Payer: Self-pay | Admitting: Nurse Practitioner

## 2018-11-02 ENCOUNTER — Encounter: Payer: Self-pay | Admitting: Nurse Practitioner

## 2018-12-06 ENCOUNTER — Telehealth: Payer: Self-pay

## 2018-12-06 NOTE — Telephone Encounter (Signed)
Unable to get in contact with the patient. I left a voicemail letting the patient know that his appointment for tomorrow has been cancelled. I offered a telephone visit, but the patient must consent before I can schedule it. Office number was provided.

## 2018-12-06 NOTE — Telephone Encounter (Signed)
Pt called back to consent to a telephone visit. Pt was informed that it will be at the same time and date of original appt.

## 2018-12-07 ENCOUNTER — Encounter: Payer: Self-pay | Admitting: Nurse Practitioner

## 2018-12-07 ENCOUNTER — Ambulatory Visit (INDEPENDENT_AMBULATORY_CARE_PROVIDER_SITE_OTHER): Payer: Medicare Other | Admitting: Nurse Practitioner

## 2018-12-07 ENCOUNTER — Other Ambulatory Visit: Payer: Self-pay

## 2018-12-07 DIAGNOSIS — M542 Cervicalgia: Secondary | ICD-10-CM

## 2018-12-07 DIAGNOSIS — R51 Headache: Secondary | ICD-10-CM

## 2018-12-07 DIAGNOSIS — R519 Headache, unspecified: Secondary | ICD-10-CM

## 2018-12-07 NOTE — Progress Notes (Signed)
Virtual Visit via Video Note  I connected with Adam Holloway on 12/07/18 at 10:15 AM EDT by a video enabled telemedicine application and verified that I am speaking with the correct person using two identifiers.   I discussed the limitations of evaluation and management by telemedicine and the availability of in person appointments. The patient expressed understanding and agreed to proceed.  Patient is at home.  Provider is in the office  History of Present Illness: Adam Holloway, 48 year old male is contacted by telephone for follow-up visit.  He has a history of left-sided neck pain and headache.  He is followed through the pain center and is on Percocet.  When last seen by Dr. Jannifer Franklin on October the June 23, 2018 he was given trigger point injections with Marcaine which the patient states after a week completely took care of his symptoms and they have not returned.  He is no longer taking the gabapentin as he felt it interfered with his Percocet.  Did not do his physical therapy after his last visit as he did not feel it was necessary.  He denies any pain that shoots down the arm.  Soft tissue MRI of the neck done was unremarkable.  The patient denies any issues with controlling the bowel or bladder.  He denies any balance issues or other numbness or weakness of the arms or legs.  He claims he had pneumonia approximately 2 months ago and was treated with an antibiotic.  He is pretty much staying at home at this time due to COVID-19.  No recent headaches    Observations/Objective: Alert answers questions appropriately   Assessment and Plan: Left neck pain occipital neuralgia, symptoms were relieved with Marcaine injections to the back of the head and left neck at his last visit.  The symptoms went away after about a week and have not returned.  He stopped taking his gabapentin as he thought it interfered with his Percocet.  He continues to go to a pain clinic.   Follow Up Instructions: Patient  will follow-up on a as needed basis when he needs trigger point injections, for now his symptoms are stable.    I discussed the assessment and treatment plan with the patient. The patient was provided an opportunity to ask questions and all were answered. The patient agreed with the plan and demonstrated an understanding of the instructions.   The patient was advised to call back or seek an in-person evaluation if the symptoms worsen or if the condition fails to improve as anticipated.  I provided 23 minutes of non-face-to-face time during this encounter.   Dennie Bible, NP

## 2018-12-20 ENCOUNTER — Other Ambulatory Visit: Payer: Self-pay

## 2018-12-20 ENCOUNTER — Emergency Department
Admission: EM | Admit: 2018-12-20 | Discharge: 2018-12-20 | Disposition: A | Discharge status: Home / Self Care | Payer: Medicare Other | Attending: Emergency Medicine | Admitting: Emergency Medicine

## 2018-12-20 DIAGNOSIS — Z79899 Other long term (current) drug therapy: Secondary | ICD-10-CM | POA: Diagnosis not present

## 2018-12-20 DIAGNOSIS — F17210 Nicotine dependence, cigarettes, uncomplicated: Secondary | ICD-10-CM | POA: Insufficient documentation

## 2018-12-20 DIAGNOSIS — Z885 Allergy status to narcotic agent status: Secondary | ICD-10-CM | POA: Diagnosis not present

## 2018-12-20 DIAGNOSIS — T7840XA Allergy, unspecified, initial encounter: Secondary | ICD-10-CM

## 2018-12-20 DIAGNOSIS — R21 Rash and other nonspecific skin eruption: Secondary | ICD-10-CM | POA: Diagnosis present

## 2018-12-20 LAB — CBC
HCT: 40.4 % (ref 39.0–52.0)
Hemoglobin: 13.5 g/dL (ref 13.0–17.0)
MCH: 30.1 pg (ref 26.0–34.0)
MCHC: 33.4 g/dL (ref 30.0–36.0)
MCV: 90 fL (ref 80.0–100.0)
Platelets: 173 10*3/uL (ref 150–400)
RBC: 4.49 MIL/uL (ref 4.22–5.81)
RDW: 14.2 % (ref 11.5–15.5)
WBC: 14.3 10*3/uL — ABNORMAL HIGH (ref 4.0–10.5)
nRBC: 0 % (ref 0.0–0.2)

## 2018-12-20 LAB — URINALYSIS, COMPLETE (UACMP) WITH MICROSCOPIC
Bacteria, UA: NONE SEEN
Bilirubin Urine: NEGATIVE
Glucose, UA: NEGATIVE mg/dL
Hgb urine dipstick: NEGATIVE
Ketones, ur: NEGATIVE mg/dL
Leukocytes,Ua: NEGATIVE
Nitrite: NEGATIVE
Protein, ur: NEGATIVE mg/dL
Specific Gravity, Urine: 1.012 (ref 1.005–1.030)
pH: 5 (ref 5.0–8.0)

## 2018-12-20 LAB — BASIC METABOLIC PANEL
Anion gap: 12 (ref 5–15)
BUN: 8 mg/dL (ref 6–20)
CO2: 22 mmol/L (ref 22–32)
Calcium: 8.8 mg/dL — ABNORMAL LOW (ref 8.9–10.3)
Chloride: 102 mmol/L (ref 98–111)
Creatinine, Ser: 0.83 mg/dL (ref 0.61–1.24)
GFR calc Af Amer: >60 mL/min (ref >60)
GFR calc non Af Amer: >60 mL/min (ref >60)
Glucose, Bld: 139 mg/dL — ABNORMAL HIGH (ref 70–99)
Potassium: 3.7 mmol/L (ref 3.5–5.1)
Sodium: 136 mmol/L (ref 135–145)

## 2018-12-20 MED ORDER — DIPHENHYDRAMINE HCL 25 MG PO CAPS
50.0000 mg | ORAL_CAPSULE | Freq: Once | ORAL | Status: AC
  Administered 2018-12-20: 06:00:00 50 mg via ORAL
  Filled 2018-12-20: qty 2

## 2018-12-20 NOTE — ED Triage Notes (Signed)
Pt to ED via ems from home with rash on bilateral hands x1 week. Pt states when he takes tramadol his hands become very inflamed. Pt takes oxycodone for sciatica and was prescribed tramadol for withdraw. VSS. NAD.

## 2018-12-20 NOTE — ED Provider Notes (Signed)
[facility] Emergency Department Provider Note    First MD Initiated Contact with Patient 12/20/18 0501     (approximate)  I have reviewed the triage vital signs and the nursing notes.   HISTORY  Chief Complaint Allergic Reaction    HPI Adam Holloway is a 48 y.o. male with medical history as listed below with below list of previous medical conditions presents to the emergency department with concern for allergic reaction to tramadol.  Patient states that whenever he takes tramadol he gets a rash and hand swelling.  Patient states that he was recently changed to tramadol from hydrocodone by his chronic pain physician and has been experiencing beforementioned symptoms since then.  Patient denies any difficulty breathing no difficulty swallowing.  Patient denies any abdominal pain.  Patient also requesting for me to evaluate his kidney function        Past Medical History:  Diagnosis Date  . Arthritis    back, left wrist, left knee  . Complication of anesthesia    bp dropped during gallbladder surgery in 2008  . Depression   . Family history of adverse reaction to anesthesia    mom stopped breathing during colonscopy  . GERD (gastroesophageal reflux disease)    h/o  . HA (headache)    h/o migrianes  . Herniated disc, cervical   . History of kidney stones    h/o stones  . Lumbago   . Pain in soft tissues of limb   . PTSD (post-traumatic stress disorder)   . Scoliosis     Patient Active Problem List   Diagnosis Date Noted  . Neck pain 12/07/2018  . HA (headache)   . Pain in soft tissues of limb   . Lumbago   . Gynecomastia, male 07/12/2013    Past Surgical History:  Procedure Laterality Date  . CHOLECYSTECTOMY  2008  . COLONOSCOPY  2010  . COLONOSCOPY WITH PROPOFOL N/A 06/09/2016   Procedure: COLONOSCOPY WITH PROPOFOL;  Surgeon: Christene Lye, MD;  Location: [facility];  Service: Endoscopy;  Laterality: N/A;  .  FISSURECTOMY N/A 06/29/2016   Procedure: FISSURECTOMY;  Surgeon: Christene Lye, MD;  Location: [facility];  Service: General;  Laterality: N/A;  . HEMORRHOID SURGERY N/A 06/29/2016   Procedure: HEMORRHOIDECTOMY;  Surgeon: Christene Lye, MD;  Location: [facility];  Service: General;  Laterality: N/A;  . PAROTIDECTOMY Left 1997  . SALIVARY GLAND SURGERY  1997  . SPHINCTEROTOMY N/A 06/29/2016   Procedure: SPHINCTEROTOMY;  Surgeon: Christene Lye, MD;  Location: [facility];  Service: General;  Laterality: N/A;  . WRIST ARTHROSCOPY Left 01/26/2017   Procedure: ARTHROSCOPY LEFT WRIST DEBRIDEMENT;  Surgeon: Daryll Brod, MD;  Location: [place];  Service: Orthopedics;  Laterality: Left;  . WRIST SURGERY  2019    Prior to Admission medications   Medication Sig Start Date End Date Taking? Authorizing Provider  clonazePAM (KLONOPIN) 0.5 MG tablet Take 0.5 mg by mouth 2 (two) times daily.    [provider]  fluticasone furoate-vilanterol (BREO ELLIPTA) 100-25 MCG/INH AEPB Inhale 1 puff into the lungs daily.    [provider]  oxyCODONE-acetaminophen (PERCOCET/ROXICET) 5-325 MG tablet Take 1 tablet by mouth 5 (five) times daily. 05/30/18   [provider]  traMADol (ULTRAM) 50 MG tablet Take 1 tablet (50 mg total) by mouth every 6 (six) hours as needed. 06/29/16   Christene Lye, MD    Allergies Augmentin [amoxicillin-pot clavulanate]; Hydrocodone-acetaminophen; Nsaids; Tolmetin;  and Meloxicam  Family History  Problem Relation Age of Onset  . Cancer Father   . Heart Problems Father   . Lung cancer Father   . Colon cancer Mother     Social History Social History   Tobacco Use  . Smoking status: Current Every Day Smoker    Packs/day: 0.50    Years: 32.00    Pack years: 16.00    Types: Cigarettes  . Smokeless tobacco: Former Systems developer  . Tobacco comment: Quit 2014  Substance Use Topics  . Alcohol use: No    Alcohol/week: 0.0  standard drinks  . Drug use: No    Review of Systems Constitutional: No fever/chills Eyes: No visual changes. ENT: No sore throat. Cardiovascular: Denies chest pain. Respiratory: Denies shortness of breath. Gastrointestinal: No abdominal pain.  No nausea, no vomiting.  No diarrhea.  No constipation. Genitourinary: Negative for dysuria. Musculoskeletal: Negative for neck pain.  Negative for back pain. Integumentary: Positive for rash. Neurological: Negative for headaches, focal weakness or numbness.   ____________________________________________   PHYSICAL EXAM:  VITAL SIGNS: ED Triage Vitals  Enc Vitals Group     BP 12/20/18 0500 (!) 144/91     Pulse Rate 12/20/18 0500 (!) 105     Resp 12/20/18 0500 16     Temp 12/20/18 0503 98.3 F (36.8 C)     Temp Source 12/20/18 0503 Oral     SpO2 12/20/18 0500 98 %     Weight 12/20/18 0458 95.3 kg (210 lb)     Height 12/20/18 0458 1.829 m (6')     Head Circumference --      Peak Flow --      Pain Score 12/20/18 0503 9     Pain Loc --      Pain Edu? --      Excl. in [place]? --     Constitutional: Alert and oriented. Well appearing and in no acute distress. Eyes: Conjunctivae are normal. Mouth/Throat: Mucous membranes are moist.  Oropharynx non-erythematous. Neck: No stridor.   Cardiovascular: Normal rate, regular rhythm. Good peripheral circulation. Grossly normal heart sounds. Respiratory: Normal respiratory effort.  No retractions. Lungs CTAB. Gastrointestinal: Soft and nontender. No distention.  Musculoskeletal: No lower extremity tenderness nor edema. No gross deformities of extremities. Neurologic:  Normal speech and language. No gross focal neurologic deficits are appreciated.  Skin:  Skin is warm, dry and intact. No rash noted. Psychiatric: Mood and affect are normal. Speech and behavior are normal.  ____________________________________________   LABS (all labs ordered are listed, but only abnormal results are  displayed)  Labs Reviewed  BASIC METABOLIC PANEL - Abnormal; Notable for the following components:      Result Value   Glucose, Bld 139 (*)    Calcium 8.8 (*)    All other components within normal limits  URINALYSIS, COMPLETE (UACMP) WITH MICROSCOPIC - Abnormal; Notable for the following components:   Color, Urine YELLOW (*)    APPearance HAZY (*)    All other components within normal limits  CBC      Procedures   ____________________________________________   INITIAL IMPRESSION / MDM / ASSESSMENT AND PLAN / ED COURSE  As part of my medical decision making, I reviewed the following data within the [place] was evaluated in Emergency Department on 12/20/2018 for the symptoms described in the history of present illness. He was evaluated in the context of the global COVID-19 pandemic, which necessitated consideration that the  patient might be at risk for infection with the SARS-CoV-2 virus that causes COVID-19. Institutional protocols and algorithms that pertain to the evaluation of patients at risk for COVID-19 are in a state of rapid change based on information released by regulatory bodies including the CDC and federal and state organizations. These policies and algorithms were followed during the patient's care in the ED.       ____________________________________________  FINAL CLINICAL IMPRESSION(S) / ED DIAGNOSES  Final diagnoses:  Allergic reaction, initial encounter     MEDICATIONS GIVEN DURING THIS VISIT:  Medications - No data to display   ED Discharge Orders    None       Note:  This document was prepared using Dragon voice recognition software and may include unintentional dictation errors.   Gregor Hams, MD 12/20/18 [phone]

## 2018-12-22 ENCOUNTER — Emergency Department: Payer: Medicare Other

## 2018-12-22 ENCOUNTER — Emergency Department
Admission: EM | Admit: 2018-12-22 | Discharge: 2018-12-22 | Disposition: A | Discharge status: Home / Self Care | Payer: Medicare Other | Attending: Emergency Medicine | Admitting: Emergency Medicine

## 2018-12-22 ENCOUNTER — Other Ambulatory Visit: Payer: Self-pay

## 2018-12-22 ENCOUNTER — Encounter: Payer: Self-pay | Admitting: Emergency Medicine

## 2018-12-22 DIAGNOSIS — K859 Acute pancreatitis without necrosis or infection, unspecified: Secondary | ICD-10-CM | POA: Diagnosis not present

## 2018-12-22 DIAGNOSIS — Z79899 Other long term (current) drug therapy: Secondary | ICD-10-CM | POA: Diagnosis not present

## 2018-12-22 DIAGNOSIS — R1012 Left upper quadrant pain: Secondary | ICD-10-CM | POA: Diagnosis present

## 2018-12-22 DIAGNOSIS — F17210 Nicotine dependence, cigarettes, uncomplicated: Secondary | ICD-10-CM | POA: Diagnosis not present

## 2018-12-22 DIAGNOSIS — R109 Unspecified abdominal pain: Secondary | ICD-10-CM

## 2018-12-22 LAB — BASIC METABOLIC PANEL
Anion gap: 13 (ref 5–15)
BUN: 5 mg/dL — ABNORMAL LOW (ref 6–20)
CO2: 22 mmol/L (ref 22–32)
Calcium: 9.1 mg/dL (ref 8.9–10.3)
Chloride: 98 mmol/L (ref 98–111)
Creatinine, Ser: 0.68 mg/dL (ref 0.61–1.24)
GFR calc Af Amer: >60 mL/min (ref >60)
GFR calc non Af Amer: >60 mL/min (ref >60)
Glucose, Bld: 95 mg/dL (ref 70–99)
Potassium: 4.6 mmol/L (ref 3.5–5.1)
Sodium: 133 mmol/L — ABNORMAL LOW (ref 135–145)

## 2018-12-22 LAB — CBC WITH DIFFERENTIAL/PLATELET
Abs Immature Granulocytes: 0.05 10*3/uL (ref 0.00–0.07)
Basophils Absolute: 0 10*3/uL (ref 0.0–0.1)
Basophils Relative: 0 %
Eosinophils Absolute: 0 10*3/uL (ref 0.0–0.5)
Eosinophils Relative: 0 %
HCT: 40.6 % (ref 39.0–52.0)
Hemoglobin: 13.7 g/dL (ref 13.0–17.0)
Immature Granulocytes: 0 %
Lymphocytes Relative: 19 %
Lymphs Abs: 3.2 10*3/uL (ref 0.7–4.0)
MCH: 30.1 pg (ref 26.0–34.0)
MCHC: 33.7 g/dL (ref 30.0–36.0)
MCV: 89.2 fL (ref 80.0–100.0)
Monocytes Absolute: 1.4 10*3/uL — ABNORMAL HIGH (ref 0.1–1.0)
Monocytes Relative: 8 %
Neutro Abs: 12.1 10*3/uL — ABNORMAL HIGH (ref 1.7–7.7)
Neutrophils Relative %: 73 %
Platelets: 207 10*3/uL (ref 150–400)
RBC: 4.55 MIL/uL (ref 4.22–5.81)
RDW: 13.9 % (ref 11.5–15.5)
WBC: 16.7 10*3/uL — ABNORMAL HIGH (ref 4.0–10.5)
nRBC: 0 % (ref 0.0–0.2)

## 2018-12-22 LAB — URINALYSIS, COMPLETE (UACMP) WITH MICROSCOPIC
Bacteria, UA: NONE SEEN
Bilirubin Urine: NEGATIVE
Glucose, UA: NEGATIVE mg/dL
Hgb urine dipstick: NEGATIVE
Ketones, ur: NEGATIVE mg/dL
Leukocytes,Ua: NEGATIVE
Nitrite: NEGATIVE
Protein, ur: NEGATIVE mg/dL
Specific Gravity, Urine: 1.001 — ABNORMAL LOW (ref 1.005–1.030)
Squamous Epithelial / HPF: NONE SEEN (ref 0–5)
WBC, UA: NONE SEEN WBC/hpf (ref 0–5)
pH: 6 (ref 5.0–8.0)

## 2018-12-22 LAB — HEPATIC FUNCTION PANEL
ALT: 45 U/L — ABNORMAL HIGH (ref 0–44)
AST: 41 U/L (ref 15–41)
Albumin: 4.7 g/dL (ref 3.5–5.0)
Alkaline Phosphatase: 73 U/L (ref 38–126)
Bilirubin, Direct: 0.2 mg/dL (ref 0.0–0.2)
Indirect Bilirubin: 0.2 mg/dL — ABNORMAL LOW (ref 0.3–0.9)
Total Bilirubin: 0.4 mg/dL (ref 0.3–1.2)
Total Protein: 7.7 g/dL (ref 6.5–8.1)

## 2018-12-22 LAB — TROPONIN I: Troponin I: <0.03 ng/mL (ref <0.03)

## 2018-12-22 LAB — LIPASE, BLOOD: Lipase: 78 U/L — ABNORMAL HIGH (ref 11–51)

## 2018-12-22 MED ORDER — IOHEXOL 300 MG/ML  SOLN
100.0000 mL | Freq: Once | INTRAMUSCULAR | Status: AC | PRN
  Administered 2018-12-22: 100 mL via INTRAVENOUS

## 2018-12-22 MED ORDER — HYDROMORPHONE HCL 1 MG/ML IJ SOLN
1.0000 mg | Freq: Once | INTRAMUSCULAR | Status: AC
  Administered 2018-12-22: 1 mg via INTRAVENOUS
  Filled 2018-12-22: qty 1

## 2018-12-22 MED ORDER — SODIUM CHLORIDE 0.9 % IV BOLUS
1000.0000 mL | Freq: Once | INTRAVENOUS | Status: AC
  Administered 2018-12-22: 17:00:00 1000 mL via INTRAVENOUS

## 2018-12-22 NOTE — ED Notes (Signed)
Pt asked this nurse to stop pushing med at .34ml, wants to see if it will relieve the pain. Pt going to ct and will reassess at his return.

## 2018-12-22 NOTE — ED Notes (Signed)
Wasted .25 ml dilaudid; pt decided he did not need the rest. Placed in sharps container; Anderson Malta, RN witnessed.

## 2018-12-22 NOTE — ED Notes (Signed)
ED Provider at bedside. 

## 2018-12-22 NOTE — ED Provider Notes (Addendum)
[facility] Emergency Department Provider Note ____________________________________________   First MD Initiated Contact with Patient 12/22/18 1505     (approximate)  I have reviewed the triage vital signs and the nursing notes.   HISTORY  Chief Complaint multiple complaints    HPI Adam Holloway is a 48 y.o. male with a history of chronic pain and other PMH as noted below who presents with bilateral flank pain, gradual onset over the last few weeks, somewhat worse today, and not associated with any urinary symptoms.  The patient reported some possible chest pain to the triage nurse but denied any to me.  He also states he is concerned about his liver as he was chronically on Percocet and tramadol.  He states he feels like he is dehydrated.  He also states that he was switched to Suboxone yesterday.  He denies fever, cough, shortness of breath, or any vomiting or diarrhea.  He had some lower abdominal pain earlier but this has resolved as well.  Past Medical History:  Diagnosis Date  . Arthritis    back, left wrist, left knee  . Complication of anesthesia    bp dropped during gallbladder surgery in 2008  . Depression   . Family history of adverse reaction to anesthesia    mom stopped breathing during colonscopy  . GERD (gastroesophageal reflux disease)    h/o  . HA (headache)    h/o migrianes  . Herniated disc, cervical   . History of kidney stones    h/o stones  . Lumbago   . Pain in soft tissues of limb   . PTSD (post-traumatic stress disorder)   . Scoliosis     Patient Active Problem List   Diagnosis Date Noted  . Neck pain 12/07/2018  . HA (headache)   . Pain in soft tissues of limb   . Lumbago   . Gynecomastia, male 07/12/2013    Past Surgical History:  Procedure Laterality Date  . CHOLECYSTECTOMY  2008  . COLONOSCOPY  2010  . COLONOSCOPY WITH PROPOFOL N/A 06/09/2016   Procedure: COLONOSCOPY WITH PROPOFOL;  Surgeon: Christene Lye, MD;  Location: [facility] ENDOSCOPY;  Service: Endoscopy;  Laterality: N/A;  . FISSURECTOMY N/A 06/29/2016   Procedure: FISSURECTOMY;  Surgeon: Christene Lye, MD;  Location: [facility] ORS;  Service: General;  Laterality: N/A;  . HEMORRHOID SURGERY N/A 06/29/2016   Procedure: HEMORRHOIDECTOMY;  Surgeon: Christene Lye, MD;  Location: [facility] ORS;  Service: General;  Laterality: N/A;  . PAROTIDECTOMY Left 1997  . SALIVARY GLAND SURGERY  1997  . SPHINCTEROTOMY N/A 06/29/2016   Procedure: SPHINCTEROTOMY;  Surgeon: Christene Lye, MD;  Location: [facility] ORS;  Service: General;  Laterality: N/A;  . WRIST ARTHROSCOPY Left 01/26/2017   Procedure: ARTHROSCOPY LEFT WRIST DEBRIDEMENT;  Surgeon: Daryll Brod, MD;  Location: [place];  Service: Orthopedics;  Laterality: Left;  . WRIST SURGERY  2019    Prior to Admission medications   Medication Sig Start Date End Date Taking? Authorizing Provider  clonazePAM (KLONOPIN) 0.5 MG tablet Take 0.5 mg by mouth 2 (two) times daily.    [provider]  fluticasone furoate-vilanterol (BREO ELLIPTA) 100-25 MCG/INH AEPB Inhale 1 puff into the lungs daily.    [provider]  oxyCODONE-acetaminophen (PERCOCET/ROXICET) 5-325 MG tablet Take 1 tablet by mouth 5 (five) times daily. 05/30/18   [provider]  traMADol (ULTRAM) 50 MG tablet Take 1 tablet (50 mg total) by mouth every 6 (six)  hours as needed. 06/29/16   Christene Lye, MD    Allergies Augmentin [amoxicillin-pot clavulanate]; Hydrocodone-acetaminophen; Nsaids; Tolmetin; and Meloxicam  Family History  Problem Relation Age of Onset  . Cancer Father   . Heart Problems Father   . Lung cancer Father   . Colon cancer Mother     Social History Social History   Tobacco Use  . Smoking status: Current Every Day Smoker    Packs/day: 0.50    Years: 32.00    Pack years: 16.00    Types: Cigarettes  . Smokeless tobacco: Former Systems developer  . Tobacco  comment: Quit 2014  Substance Use Topics  . Alcohol use: No    Alcohol/week: 0.0 standard drinks  . Drug use: No    Review of Systems  Constitutional: No fever. Eyes: No redness. ENT: No sore throat. Cardiovascular: Negative for chest pain. Respiratory: Denies shortness of breath. Gastrointestinal: No vomiting or diarrhea.  Genitourinary: Negative for dysuria.  Positive for bilateral flank pain. Musculoskeletal: Positive for back pain. Skin: Negative for rash. Neurological: Negative for headache.   ____________________________________________   PHYSICAL EXAM:  VITAL SIGNS: ED Triage Vitals  Enc Vitals Group     BP 12/22/18 1444 138/88     Pulse Rate 12/22/18 1444 (!) 107     Resp 12/22/18 1444 16     Temp 12/22/18 1444 98.5 F (36.9 C)     Temp Source 12/22/18 1444 Oral     SpO2 12/22/18 1444 97 %     Weight 12/22/18 1445 240 lb (108.9 kg)     Height 12/22/18 1445 6\' 2"  (1.88 m)     Head Circumference --      Peak Flow --      Pain Score 12/22/18 1444 4     Pain Loc --      Pain Edu? --      Excl. in [place]? --     Constitutional: Alert and oriented.  Anxious appearing but in no acute distress. Eyes: Conjunctivae are normal.  No scleral icterus. Head: Atraumatic. Nose: No congestion/rhinnorhea. Mouth/Throat: Mucous membranes are moist.   Neck: Normal range of motion.  Cardiovascular: Tachycardic, regular rhythm. Good peripheral circulation. Respiratory: Normal respiratory effort.  No retractions. Gastrointestinal: No distention.  Genitourinary: No CVA tenderness. Musculoskeletal: No lower extremity edema.  Extremities warm and well perfused.  No midline spinal tenderness. Neurologic:  Normal speech and language. No gross focal neurologic deficits are appreciated.  Skin:  Skin is warm and dry. No rash noted. Psychiatric: Anxious appearing.  Speech and behavior are normal.  ____________________________________________   LABS (all labs ordered are listed, but  only abnormal results are displayed)  Labs Reviewed  BASIC METABOLIC PANEL - Abnormal; Notable for the following components:      Result Value   Sodium 133 (*)    BUN 5 (*)    All other components within normal limits  HEPATIC FUNCTION PANEL - Abnormal; Notable for the following components:   ALT 45 (*)    Indirect Bilirubin 0.2 (*)    All other components within normal limits  LIPASE, BLOOD - Abnormal; Notable for the following components:   Lipase 78 (*)    All other components within normal limits  CBC WITH DIFFERENTIAL/PLATELET - Abnormal; Notable for the following components:   WBC 16.7 (*)    Neutro Abs 12.1 (*)    Monocytes Absolute 1.4 (*)    All other components within normal limits  URINALYSIS, COMPLETE (UACMP) WITH MICROSCOPIC -  Abnormal; Notable for the following components:   Color, Urine COLORLESS (*)    APPearance CLEAR (*)    Specific Gravity, Urine 1.001 (*)    All other components within normal limits  TROPONIN I   ____________________________________________  EKG  ED ECG REPORT I, Arta Silence, the attending physician, personally viewed and interpreted this ECG.  Date: 12/22/2018 EKG Time: 1440 Rate: 101 Rhythm: normal sinus rhythm QRS Axis: normal Intervals: normal ST/T Wave abnormalities: normal Narrative Interpretation: no evidence of acute ischemia  ____________________________________________  RADIOLOGY    ____________________________________________   PROCEDURES  Procedure(s) performed: No  Procedures  Critical Care performed: No ____________________________________________   INITIAL IMPRESSION / ASSESSMENT AND PLAN / ED COURSE  Pertinent labs & imaging results that were available during my care of the patient were reviewed by me and considered in my medical decision making (see chart for details).  48 year old male with PMH as noted above presents with bilateral flank pain over the last few weeks, as well as several  other complaints including resolved chest discomfort, resolved abdominal pain, and feeling like he is dehydrated.  On exam, he is anxious but otherwise well-appearing.  His vital signs are normal except for slight tachycardia.  The remainder of the exam is unremarkable.  I reviewed the past medical records in Epic; the patient was just in the ED 2 days ago with a rash after taking tramadol.  Overall I suspect most likely musculoskeletal pain or other benign etiology.  His symptoms are also likely related to recently switching to Suboxone.  The patient is appears extremely anxious about his medical symptoms and about coronavirus, and I suspect that his anxiety is exacerbating his symptoms as well.  I have a low suspicion for pyelonephritis or ureteral stone.  I also have very low suspicion for ACS or other acute cardiac etiology.  We will obtain basic and hepatobiliary labs, UA, and a screening troponin.  If this work-up is negative anticipate discharge home.  ----------------------------------------- 4:46 PM on 12/22/2018 -----------------------------------------  Lab work-up reveals elevated WBC count, as well as a slightly elevated lipase.  Other LFTs are normal.  I suspect that the patient's symptoms are related to mild pancreatitis.  He is tolerating p.o. and has no vomiting.  He has no evidence of sepsis.  On reassessment, the heart rate is around 100-105 when the patient is sitting calmly.  I will give a liter of fluid and reassess.  Based on the minimally elevated lipase and the fact that he is tolerating p.o., the patient does not require admission at this time.  ----------------------------------------- 6:29 PM on 12/22/2018 -----------------------------------------  Patient reports worsening epigastric pain somewhat radiating to the left.  Since he is increasingly manifesting with pancreatitis clinically, and since he has no prior history of it I will obtain a CT to rule out acute  precipitating cause or necrosis.  ----------------------------------------- 7:25 PM on 12/22/2018 -----------------------------------------  CT shows no acute abnormalities.  The patient is feeling much better after fluids and after some pain medication.  He has been tolerating p.o. throughout the last few days.  Given the minimal elevation of the lipase, the otherwise reassuring lab work-up and imaging, and the patient's well appearance.  He is appropriate for discharge home.  He feels comfortable and would like to go home.  I counseled him on the results of the work-up and the plan of care, as well as a pancreatitis eating plan.  I discussed return precautions with him, and he expressed understanding.  I instructed him to contact his pain management doctor in the morning to discuss an appropriate regimen for his pain in light of this acute pain, since I would prefer not to start him on any new pain medication or restart old pain medication when he is on Suboxone.  The patient agrees with this plan.  _______  Adam Holloway was evaluated in Emergency Department on 12/22/2018 for the symptoms described in the history of present illness. He was evaluated in the context of the global COVID-19 pandemic, which necessitated consideration that the patient might be at risk for infection with the SARS-CoV-2 virus that causes COVID-19. Institutional protocols and algorithms that pertain to the evaluation of patients at risk for COVID-19 are in a state of rapid change based on information released by regulatory bodies including the CDC and federal and state organizations. These policies and algorithms were followed during the patient's care in the ED.   ____________________________________________   FINAL CLINICAL IMPRESSION(S) / ED DIAGNOSES  Final diagnoses:  Bilateral flank pain  Acute pancreatitis without infection or necrosis, unspecified pancreatitis type      NEW MEDICATIONS STARTED DURING  THIS VISIT:  New Prescriptions   No medications on file     Note:  This document was prepared using Dragon voice recognition software and may include unintentional dictation errors.           Arta Silence, MD 12/22/18 [phone]

## 2018-12-22 NOTE — Discharge Instructions (Addendum)
Talk to your pain management specialist tomorrow to discuss an appropriate regimen for your acute pain.  Your lab work-up is showing some inflammation in the pancreas.  You should follow the attached instructions in terms of what to eat and what to avoid.  This should resolve on its own within the next several days.  However, you should return to the emergency department immediately for new, worsening, persistent pain, vomiting, fever, weakness, or any other new or worsening symptoms that concern you.

## 2018-12-22 NOTE — ED Triage Notes (Signed)
Pt via ems from home with multiple complaints. Pt's initial complaint was bilateral flank pain intermittently x 3 weeks, worsening today. Pt then states he thinks he has liver problems due to the tylenol in the oxycodone and hydrocodone he has been taking (recently was switched to suboxone). Pt states he had some mid chest pain radiating down his left arm, which has now resolved. Pt also c/o pain in RLQ and upper back. Pt said his mother thinks it is anxiety. Pt alert & oriented, nad noted.

## 2018-12-24 ENCOUNTER — Emergency Department
Admission: EM | Admit: 2018-12-24 | Discharge: 2018-12-24 | Disposition: A | Discharge status: Home / Self Care | Payer: Medicare Other | Attending: Emergency Medicine | Admitting: Emergency Medicine

## 2018-12-24 ENCOUNTER — Other Ambulatory Visit: Payer: Self-pay

## 2018-12-24 ENCOUNTER — Emergency Department: Payer: Medicare Other

## 2018-12-24 ENCOUNTER — Encounter: Payer: Self-pay | Admitting: Emergency Medicine

## 2018-12-24 DIAGNOSIS — Z79899 Other long term (current) drug therapy: Secondary | ICD-10-CM | POA: Insufficient documentation

## 2018-12-24 DIAGNOSIS — F17210 Nicotine dependence, cigarettes, uncomplicated: Secondary | ICD-10-CM | POA: Insufficient documentation

## 2018-12-24 DIAGNOSIS — R6889 Other general symptoms and signs: Secondary | ICD-10-CM | POA: Insufficient documentation

## 2018-12-24 LAB — URINALYSIS, COMPLETE (UACMP) WITH MICROSCOPIC
Bacteria, UA: NONE SEEN
Bilirubin Urine: NEGATIVE
Glucose, UA: NEGATIVE mg/dL
Hgb urine dipstick: NEGATIVE
Ketones, ur: 20 mg/dL — AB
Leukocytes,Ua: NEGATIVE
Nitrite: NEGATIVE
Protein, ur: NEGATIVE mg/dL
Specific Gravity, Urine: 1.005 (ref 1.005–1.030)
Squamous Epithelial / LPF: NONE SEEN (ref 0–5)
pH: 5 (ref 5.0–8.0)

## 2018-12-24 LAB — COMPREHENSIVE METABOLIC PANEL
ALT: 56 U/L — ABNORMAL HIGH (ref 0–44)
AST: 49 U/L — ABNORMAL HIGH (ref 15–41)
Albumin: 4.4 g/dL (ref 3.5–5.0)
Alkaline Phosphatase: 74 U/L (ref 38–126)
Anion gap: 10 (ref 5–15)
BUN: 10 mg/dL (ref 6–20)
CO2: 24 mmol/L (ref 22–32)
Calcium: 8.9 mg/dL (ref 8.9–10.3)
Chloride: 104 mmol/L (ref 98–111)
Creatinine, Ser: 0.74 mg/dL (ref 0.61–1.24)
GFR calc Af Amer: >60 mL/min (ref >60)
GFR calc non Af Amer: >60 mL/min (ref >60)
Glucose, Bld: 92 mg/dL (ref 70–99)
Potassium: 4.3 mmol/L (ref 3.5–5.1)
Sodium: 138 mmol/L (ref 135–145)
Total Bilirubin: 0.7 mg/dL (ref 0.3–1.2)
Total Protein: 7.5 g/dL (ref 6.5–8.1)

## 2018-12-24 LAB — URINE DRUG SCREEN, QUALITATIVE (ARMC ONLY)
Amphetamines, Ur Screen: NOT DETECTED
Barbiturates, Ur Screen: NOT DETECTED
Benzodiazepine, Ur Scrn: NOT DETECTED
Cannabinoid 50 Ng, Ur ~~LOC~~: NOT DETECTED
Cocaine Metabolite,Ur ~~LOC~~: NOT DETECTED
MDMA (Ecstasy)Ur Screen: NOT DETECTED
Methadone Scn, Ur: NOT DETECTED
Opiate, Ur Screen: NOT DETECTED
Phencyclidine (PCP) Ur S: NOT DETECTED
Tricyclic, Ur Screen: NOT DETECTED

## 2018-12-24 LAB — CBC WITH DIFFERENTIAL/PLATELET
Abs Immature Granulocytes: 0.03 10*3/uL (ref 0.00–0.07)
Basophils Absolute: 0 10*3/uL (ref 0.0–0.1)
Basophils Relative: 0 %
Eosinophils Absolute: 0 10*3/uL (ref 0.0–0.5)
Eosinophils Relative: 0 %
HCT: 39.9 % (ref 39.0–52.0)
Hemoglobin: 13.2 g/dL (ref 13.0–17.0)
Immature Granulocytes: 0 %
Lymphocytes Relative: 18 %
Lymphs Abs: 2 10*3/uL (ref 0.7–4.0)
MCH: 30.1 pg (ref 26.0–34.0)
MCHC: 33.1 g/dL (ref 30.0–36.0)
MCV: 91.1 fL (ref 80.0–100.0)
Monocytes Absolute: 0.9 10*3/uL (ref 0.1–1.0)
Monocytes Relative: 8 %
Neutro Abs: 8.3 10*3/uL — ABNORMAL HIGH (ref 1.7–7.7)
Neutrophils Relative %: 74 %
Platelets: 188 10*3/uL (ref 150–400)
RBC: 4.38 MIL/uL (ref 4.22–5.81)
RDW: 14.1 % (ref 11.5–15.5)
WBC: 11.3 10*3/uL — ABNORMAL HIGH (ref 4.0–10.5)
nRBC: 0 % (ref 0.0–0.2)

## 2018-12-24 LAB — LIPASE, BLOOD: Lipase: 29 U/L (ref 11–51)

## 2018-12-24 LAB — TROPONIN I: Troponin I: <0.03 ng/mL (ref <0.03)

## 2018-12-24 LAB — ETHANOL: Alcohol, Ethyl (B): <10 mg/dL (ref <10)

## 2018-12-24 NOTE — ED Notes (Signed)
Patient transported to X-ray 

## 2018-12-24 NOTE — ED Triage Notes (Signed)
Pt to ED via POV stating that he is having generalized edema. Pt states that he thinks that something has gone wrong with his kidneys or his bladder. Pt states that he was seen last week. Pt states that since November he has gained over 40 pounds. Pt states that he also has issues with his pancreas. Pt also states that he is having pain in his left chest and shoulder. Pt is in NAD.

## 2018-12-24 NOTE — Discharge Instructions (Addendum)
Follow closely with your doctor first thing on Monday return to the emergency room if you change your mind about further work-up or you have other new or worrisome symptoms.

## 2018-12-24 NOTE — ED Provider Notes (Addendum)
[facility] Emergency Department Provider Note  ____________________________________________   I have reviewed the triage vital signs and the nursing notes. Where available I have reviewed prior notes and, if possible and indicated, outside hospital notes.    HISTORY  Chief Complaint This all has got me so stressed out   HPI Adam Holloway is a 48 y.o. male who presents with no SI or HI but complains of feeling generally anxious about his medical care in general the condition of the world in the coronavirus epidemic and multiple different other things.  His chief complaint for the triage nurse was that he was suffering from edema.  He denies that to me he does state the bottom of his feet look red sometimes.  He states he actually does not have any edema.  Actually when I asked him if he has edema he states "I have not looked" and that he looked at his legs and tell me that he did not.  She denies any chest pain but he states sometimes when he lies down he can hear the veins pounding and it makes him anxious.  That has been going on for some time.  He has no abdominal pain but he states that he is still getting over his pancreatitis that he recently had.  He denies pain in his abdomen.  He denies any exertional symptoms.  Most of the symptoms seem worse when he sits down and has a call moment to think about them.  Denies shortness of breath chest pain cough PE or DVT history.  He has no pleuritic chest pain.  He denies any fever chills nausea vomiting or diarrhea. Patient has to be redirected for review of systems because he will begin to discuss other things when you try to focus on one thing   Past Medical History:  Diagnosis Date  . Arthritis    back, left wrist, left knee  . Complication of anesthesia    bp dropped during gallbladder surgery in 2008  . Depression   . Family history of adverse reaction to anesthesia    mom stopped breathing during colonscopy   . GERD (gastroesophageal reflux disease)    h/o  . HA (headache)    h/o migrianes  . Herniated disc, cervical   . History of kidney stones    h/o stones  . Lumbago   . Pain in soft tissues of limb   . PTSD (post-traumatic stress disorder)   . Scoliosis     Patient Active Problem List   Diagnosis Date Noted  . Neck pain 12/07/2018  . HA (headache)   . Pain in soft tissues of limb   . Lumbago   . Gynecomastia, male 07/12/2013    Past Surgical History:  Procedure Laterality Date  . CHOLECYSTECTOMY  2008  . COLONOSCOPY  2010  . COLONOSCOPY WITH PROPOFOL N/A 06/09/2016   Procedure: COLONOSCOPY WITH PROPOFOL;  Surgeon: Christene Lye, MD;  Location: [facility] ENDOSCOPY;  Service: Endoscopy;  Laterality: N/A;  . FISSURECTOMY N/A 06/29/2016   Procedure: FISSURECTOMY;  Surgeon: Christene Lye, MD;  Location: [facility] ORS;  Service: General;  Laterality: N/A;  . HEMORRHOID SURGERY N/A 06/29/2016   Procedure: HEMORRHOIDECTOMY;  Surgeon: Christene Lye, MD;  Location: [facility] ORS;  Service: General;  Laterality: N/A;  . PAROTIDECTOMY Left 1997  . SALIVARY GLAND SURGERY  1997  . SPHINCTEROTOMY N/A 06/29/2016   Procedure: SPHINCTEROTOMY;  Surgeon: Christene Lye, MD;  Location: [facility] ORS;  Service:  General;  Laterality: N/A;  . WRIST ARTHROSCOPY Left 01/26/2017   Procedure: ARTHROSCOPY LEFT WRIST DEBRIDEMENT;  Surgeon: Daryll Brod, MD;  Location: [place];  Service: Orthopedics;  Laterality: Left;  . WRIST SURGERY  2019    Prior to Admission medications   Medication Sig Start Date End Date Taking? Authorizing Provider  clonazePAM (KLONOPIN) 0.5 MG tablet Take 0.5 mg by mouth 2 (two) times daily.    [provider]  fluticasone furoate-vilanterol (BREO ELLIPTA) 100-25 MCG/INH AEPB Inhale 1 puff into the lungs daily.    [provider]  oxyCODONE-acetaminophen (PERCOCET/ROXICET) 5-325 MG tablet Take 1 tablet by mouth 5 (five) times daily.  05/30/18   [provider]  traMADol (ULTRAM) 50 MG tablet Take 1 tablet (50 mg total) by mouth every 6 (six) hours as needed. 06/29/16   Christene Lye, MD    Allergies Augmentin [amoxicillin-pot clavulanate]; Hydrocodone-acetaminophen; Nsaids; Tolmetin; and Meloxicam  Family History  Problem Relation Age of Onset  . Cancer Father   . Heart Problems Father   . Lung cancer Father   . Colon cancer Mother     Social History Social History   Tobacco Use  . Smoking status: Current Every Day Smoker    Packs/day: 0.50    Years: 32.00    Pack years: 16.00    Types: Cigarettes  . Smokeless tobacco: Former Systems developer  . Tobacco comment: Quit 2014  Substance Use Topics  . Alcohol use: No    Alcohol/week: 0.0 standard drinks  . Drug use: No    Review of Systems Constitutional: No fever/chills Eyes: No visual changes. ENT: No sore throat. No stiff neck no neck pain Cardiovascular: Denies chest pain.  See HPI about his sensation that he can hear his veins when he lies flat sometimes Respiratory: Denies shortness of breath. Gastrointestinal:   no vomiting.  No diarrhea.  No constipation. Genitourinary: Negative for dysuria. Musculoskeletal: Negative lower extremity swelling Skin: Negative for rash. Neurological: Negative for severe headaches, focal weakness or numbness.   ____________________________________________   PHYSICAL EXAM:  VITAL SIGNS: ED Triage Vitals  Enc Vitals Group     BP 12/24/18 1038 133/80     Pulse Rate 12/24/18 1038 99     Resp 12/24/18 1038 16     Temp 12/24/18 1038 97.7 F (36.5 C)     Temp Source 12/24/18 1038 Axillary     SpO2 12/24/18 1038 96 %     Weight 12/24/18 1036 238 lb 1.6 oz (108 kg)     Height 12/24/18 1036 6\' 2"  (1.88 m)     Head Circumference --      Peak Flow --      Pain Score 12/24/18 1035 6     Pain Loc --      Pain Edu? --      Excl. in [place]? --     Constitutional: Alert and oriented. Well appearing and in no  acute distress.  Quite anxious in appearance but in no acute distress Eyes: Conjunctivae are normal Head: Atraumatic HEENT: No congestion/rhinnorhea. Mucous membranes are moist.  Oropharynx non-erythematous Neck:   Nontender with no meningismus, no masses, no stridor Cardiovascular: Normal rate, regular rhythm. Grossly normal heart sounds.  Good peripheral circulation. Respiratory: Normal respiratory effort.  No retractions. Lungs CTAB. Abdominal: Soft and nontender. No distention. No guarding no rebound Back:  There is no focal tenderness or step off.  there is no midline tenderness there are no lesions noted. there is  no CVA tenderness Musculoskeletal: No lower extremity tenderness, no upper extremity tenderness. No joint effusions, no DVT signs strong distal pulses no edema Neurologic:  Normal speech and language. No gross focal neurologic deficits are appreciated.  Skin:  Skin is warm, dry and intact. No rash noted. Psychiatric: Mood and affect are normal. Speech and behavior are normal.  ____________________________________________   LABS (all labs ordered are listed, but only abnormal results are displayed)  Labs Reviewed  CBC WITH DIFFERENTIAL/PLATELET  TROPONIN I  COMPREHENSIVE METABOLIC PANEL  ETHANOL  LIPASE, BLOOD  URINE DRUG SCREEN, QUALITATIVE ([facility] ONLY)  URINALYSIS, COMPLETE ([facility]) WITH MICROSCOPIC    Pertinent labs  results that were available during my care of the patient were reviewed by me and considered in my medical decision making (see chart for details). ____________________________________________  EKG  I personally interpreted any EKGs ordered by me or triage Sinus rhythm rate 87 bpm no acute ST elevation depression normal axis unremarkable EKG ____________________________________________  RADIOLOGY  Pertinent labs & imaging results that were available during my care of the patient were reviewed by me and considered in my medical decision making (see  chart for details). If possible, patient and/or family made aware of any abnormal findings.  No results found. ____________________________________________    PROCEDURES  Procedure(s) performed: None  Procedures  Critical Care performed: None  ____________________________________________   INITIAL IMPRESSION / ASSESSMENT AND PLAN / ED COURSE  Pertinent labs & imaging results that were available during my care of the patient were reviewed by me and considered in my medical decision making (see chart for details).  Here with a great deal of concern about his body generally speaking although without very many focal concerns that seem to bear scrutiny.  For example he is not having any chest pain he is not having edema he is not having nausea vomiting diarrhea he just has a sensation that he feels generally unwell he is very anxious.  He does admit to being very anxious.  Obviously is very difficult to work-up all of these things in a 48 year old patient but we will do our best to take a look and make sure nothing else is going on.  I have reviewed prior notes which seem not dissimilar from this presentation.  Low suspicion for ACS or PE but I will get a chest x-ray and I will check cardiac enzymes low suspicion for renal failure although patient is concerned he might be in renal failure.  He has never been in renal failure before he does not have any other stigmata of renal failure but will check his kidney functions.  The no evidence of edema or DVT.  There is no evidence of anemia or acute infectious process.  In general, patient looks quite well despite his multiple complaints but we will do our best to do a medical screening exam and reassess.  ----------------------------------------- 12:49 PM on 12/24/2018 -----------------------------------------  But is very reassuring, I have offered the patient serial cardiac enzymes but his strong preference would be to get out of the emergency  room because he is now very worried that he will catch coronavirus by being here.  He does asked me what kind of fluids he should drink and have advised him to drink fluids as tolerated, he has some concerns about what appears to be some sun associated color change on his neck which is in the exact distribution of T-shirt tan.  I have encouraged him to follow-up with his doctor for this but  I do not see evidence of cellulitis or infection or oncologic process.  Patient seems to be again very concerned now that it took 2 sticks to get his IV he is wondering if there is a problem with his veins.  We have continue to reassure him to the extent possible and we will discharge him at the his request at this time.  Return precautions were given and understood.  At this time, there does not appear to be clinical evidence to support the diagnosis of pulmonary embolus, dissection, myocarditis, endocarditis, pericarditis, pericardial tamponade, acute coronary syndrome, pneumothorax, pneumonia, or any other acute intrathoracic pathology that will require admission or acute intervention. Nor is there evidence of any significant intra-abdominal pathology causing this discomfort.    ____________________________________________   FINAL CLINICAL IMPRESSION(S) / ED DIAGNOSES  Final diagnoses:  None      This chart was dictated using voice recognition software.  Despite best efforts to proofread,  errors can occur which can change meaning.      Schuyler Amor, MD 12/24/18 1117    Schuyler Amor, MD 12/24/18 1252

## 2019-01-17 ENCOUNTER — Emergency Department
Admission: EM | Admit: 2019-01-17 | Discharge: 2019-01-17 | Disposition: A | Discharge status: Home / Self Care | Payer: Medicare Other | Attending: Emergency Medicine | Admitting: Emergency Medicine

## 2019-01-17 ENCOUNTER — Other Ambulatory Visit: Payer: Self-pay

## 2019-01-17 DIAGNOSIS — Z79899 Other long term (current) drug therapy: Secondary | ICD-10-CM | POA: Diagnosis not present

## 2019-01-17 DIAGNOSIS — F17210 Nicotine dependence, cigarettes, uncomplicated: Secondary | ICD-10-CM | POA: Diagnosis not present

## 2019-01-17 DIAGNOSIS — F1123 Opioid dependence with withdrawal: Secondary | ICD-10-CM | POA: Insufficient documentation

## 2019-01-17 DIAGNOSIS — R000 Tachycardia, unspecified: Secondary | ICD-10-CM | POA: Diagnosis present

## 2019-01-17 DIAGNOSIS — F419 Anxiety disorder, unspecified: Secondary | ICD-10-CM | POA: Insufficient documentation

## 2019-01-17 DIAGNOSIS — F1193 Opioid use, unspecified with withdrawal: Secondary | ICD-10-CM

## 2019-01-17 NOTE — ED Notes (Addendum)
Pt has been taking narcotics for 1 year; has recently felt heart racing and also c/o nausea. Pt states he recently stopped taking narcotics. Denies chest pain.

## 2019-01-17 NOTE — ED Notes (Signed)
Pt does not want to leave.  Attempted to assure patient that black coloring on hands comes off and if he had vascular problems and organs were failing like he is concerned about that the black coloring would not come off with water and hands would be cold/blue.  Pt wanting to see MD again. Dr Corky Downs notified and at bedside. Pt does have appointment with pcp today at 945

## 2019-01-17 NOTE — ED Provider Notes (Signed)
[facility] Emergency Department Provider Note   ____________________________________________    I have reviewed the triage vital signs and the nursing notes.   HISTORY  Chief Complaint hands turning black and Tachycardia     HPI Adam Holloway is a 48 y.o. male patient presents with complaints of a black substance on his fingers which he believes is being extruded from his body.  He reports that he thought this may be related to his chronic opioid abuse and so stopped taking opioids 2 days ago.  Coincidentally he is feeling that his heart is beating faster than normal today he also reports mild nausea.  No fevers or chills.  No abdominal pain or chest pain or pleurisy  Past Medical History:  Diagnosis Date  . Arthritis    back, left wrist, left knee  . Complication of anesthesia    bp dropped during gallbladder surgery in 2008  . Depression   . Family history of adverse reaction to anesthesia    mom stopped breathing during colonscopy  . GERD (gastroesophageal reflux disease)    h/o  . HA (headache)    h/o migrianes  . Herniated disc, cervical   . History of kidney stones    h/o stones  . Lumbago   . Pain in soft tissues of limb   . PTSD (post-traumatic stress disorder)   . Scoliosis     Patient Active Problem List   Diagnosis Date Noted  . Neck pain 12/07/2018  . HA (headache)   . Pain in soft tissues of limb   . Lumbago   . Gynecomastia, male 07/12/2013    Past Surgical History:  Procedure Laterality Date  . CHOLECYSTECTOMY  2008  . COLONOSCOPY  2010  . COLONOSCOPY WITH PROPOFOL N/A 06/09/2016   Procedure: COLONOSCOPY WITH PROPOFOL;  Surgeon: Christene Lye, MD;  Location: [facility] ENDOSCOPY;  Service: Endoscopy;  Laterality: N/A;  . FISSURECTOMY N/A 06/29/2016   Procedure: FISSURECTOMY;  Surgeon: Christene Lye, MD;  Location: [facility] ORS;  Service: General;  Laterality: N/A;  . HEMORRHOID SURGERY N/A 06/29/2016   Procedure: HEMORRHOIDECTOMY;  Surgeon: Christene Lye, MD;  Location: [facility] ORS;  Service: General;  Laterality: N/A;  . PAROTIDECTOMY Left 1997  . SALIVARY GLAND SURGERY  1997  . SPHINCTEROTOMY N/A 06/29/2016   Procedure: SPHINCTEROTOMY;  Surgeon: Christene Lye, MD;  Location: [facility] ORS;  Service: General;  Laterality: N/A;  . WRIST ARTHROSCOPY Left 01/26/2017   Procedure: ARTHROSCOPY LEFT WRIST DEBRIDEMENT;  Surgeon: Daryll Brod, MD;  Location: [place];  Service: Orthopedics;  Laterality: Left;  . WRIST SURGERY  2019    Prior to Admission medications   Medication Sig Start Date End Date Taking? Authorizing Provider  clonazePAM (KLONOPIN) 0.5 MG tablet Take 0.5 mg by mouth 2 (two) times daily.    [provider]  fluticasone furoate-vilanterol (BREO ELLIPTA) 100-25 MCG/INH AEPB Inhale 1 puff into the lungs daily.    [provider]  oxyCODONE-acetaminophen (PERCOCET/ROXICET) 5-325 MG tablet Take 1 tablet by mouth 5 (five) times daily. 05/30/18   [provider]  traMADol (ULTRAM) 50 MG tablet Take 1 tablet (50 mg total) by mouth every 6 (six) hours as needed. 06/29/16   Christene Lye, MD     Allergies Augmentin [amoxicillin-pot clavulanate]; Hydrocodone-acetaminophen; Nsaids; Tolmetin; and Meloxicam  Family History  Problem Relation Age of Onset  . Cancer Father   . Heart Problems Father   . Lung cancer Father   .  Colon cancer Mother     Social History Social History   Tobacco Use  . Smoking status: Current Every Day Smoker    Packs/day: 0.50    Years: 32.00    Pack years: 16.00    Types: Cigarettes  . Smokeless tobacco: Former Systems developer  . Tobacco comment: Quit 2014  Substance Use Topics  . Alcohol use: No    Alcohol/week: 0.0 standard drinks  . Drug use: No    Review of Systems  Constitutional: No fever/chills Eyes: No visual changes.  ENT: No sore throat. Cardiovascular: Denies chest pain.  As above  Respiratory: Denies shortness of breath. Gastrointestinal: No abdominal pain.  Genitourinary: Negative for dysuria. Musculoskeletal: Negative for back pain. Skin: As above Neurological: Negative for headaches   ____________________________________________   PHYSICAL EXAM:  VITAL SIGNS: ED Triage Vitals  Enc Vitals Group     BP 01/17/19 0747 (!) 144/84     Pulse Rate 01/17/19 0747 (!) 106     Resp 01/17/19 0747 17     Temp 01/17/19 0747 97.8 F (36.6 C)     Temp Source 01/17/19 0747 Oral     SpO2 01/17/19 0747 100 %     Weight 01/17/19 0743 106.6 kg (235 lb)     Height 01/17/19 0743 1.88 m (6\' 2" )     Head Circumference --      Peak Flow --      Pain Score 01/17/19 0742 3     Pain Loc --      Pain Edu? --      Excl. in [place]? --     Constitutional: Alert and oriented. No acute distress. Eyes: Conjunctivae are normal.   Nose: No congestion/rhinnorhea. Mouth/Throat: Mucous membranes are moist.    Cardiovascular: Mild tachycardia, regular rhythm. Grossly normal heart sounds.  Good peripheral circulation. Respiratory: Normal respiratory effort.  No retractions. Lungs CTAB. Gastrointestinal: Soft and nontender. No distention.  No CVA tenderness. Genitourinary: deferred Musculoskeletal: No lower extremity tenderness nor edema.  Warm and well perfused Neurologic:  Normal speech and language. No gross focal neurologic deficits are appreciated.  Skin:  Skin is warm, dry and intact.  Patient has streaky black substance on his fingertips and underneath his fingernails and on his fingernails, easily removed with alcohol wipe. Psychiatric: Quite anxious but lucid, no apparent psychosis  ____________________________________________   LABS (all labs ordered are listed, but only abnormal results are displayed)  Labs Reviewed - No data to display ____________________________________________  EKG  None ____________________________________________  RADIOLOGY    ____________________________________________   PROCEDURES  Procedure(s) performed: No  Procedures   Critical Care performed: No ____________________________________________   INITIAL IMPRESSION / ASSESSMENT AND PLAN / ED COURSE  Pertinent labs & imaging results that were available during my care of the patient were reviewed by me and considered in my medical decision making (see chart for details).  Patient with history of anxiety presents here primarily because of a black substance on his fingertips, this is quite clearly grease or dirt or some such thing and is not being extruded from his body.  He has normal cap refill and a normal exam.  His mild tachycardia is likely related to a minimal opioid withdrawal, reassurance provided, appropriate for discharge with outpatient follow-up.    ____________________________________________   FINAL CLINICAL IMPRESSION(S) / ED DIAGNOSES  Final diagnoses:  Opioid withdrawal ([place])        Note:  This document was prepared using Dragon voice recognition software and may include unintentional  dictation errors.   Lavonia Drafts, MD 01/17/19 [phone]

## 2019-01-17 NOTE — ED Triage Notes (Signed)
Pt states he takes percocet for chronic pain, states today having a racing heart and "my hands are turning black" finger tips noted with some discoloration, asked pt is he got something on his hands pt states it just keep coming up I have not got anything on me.

## 2019-01-19 ENCOUNTER — Other Ambulatory Visit: Payer: Self-pay | Admitting: Neurology

## 2019-01-19 ENCOUNTER — Telehealth: Payer: Self-pay | Admitting: Neurology

## 2019-01-19 ENCOUNTER — Ambulatory Visit (INDEPENDENT_AMBULATORY_CARE_PROVIDER_SITE_OTHER): Payer: Medicare Other | Admitting: Neurology

## 2019-01-19 ENCOUNTER — Other Ambulatory Visit: Payer: Self-pay

## 2019-01-19 ENCOUNTER — Encounter: Payer: Self-pay | Admitting: Neurology

## 2019-01-19 DIAGNOSIS — D72829 Elevated white blood cell count, unspecified: Secondary | ICD-10-CM

## 2019-01-19 DIAGNOSIS — R202 Paresthesia of skin: Secondary | ICD-10-CM | POA: Diagnosis not present

## 2019-01-19 DIAGNOSIS — R945 Abnormal results of liver function studies: Secondary | ICD-10-CM | POA: Diagnosis not present

## 2019-01-19 DIAGNOSIS — R7989 Other specified abnormal findings of blood chemistry: Secondary | ICD-10-CM

## 2019-01-19 NOTE — Telephone Encounter (Signed)
Pt is asking if there is somewhere in [place] or [place] that he can go for his blood work. Please call

## 2019-01-19 NOTE — Telephone Encounter (Signed)
Appointment Request From: Adam Holloway    With Provider: Kathrynn Ducking, MD [[facility]]    Preferred Date Range: 01/19/2019 - 01/20/2019    Preferred Times: Any time    Reason for visit: Request an Appointment    Comments:  Having left side on and off numbness aswell as numbing of the head speech difficulty.

## 2019-01-19 NOTE — Progress Notes (Signed)
° ° ° °  Virtual Visit via Video Note  I connected with Adam Holloway on 01/19/19 at 10:00 AM EDT by a video enabled telemedicine application and verified that I am speaking with the correct person using two identifiers.  Location: Patient: The patient is at home. Provider: Physician in office.   I discussed the limitations of evaluation and management by telemedicine and the availability of in person appointments. The patient expressed understanding and agreed to proceed.  History of Present Illness: Adam Holloway is a 48 year old right-handed white male with a history of a chronic pain syndrome, he is followed through a pain center.  He recently indicates that he developed an allergy with a skin rash to Percocet, he was switched to hydrocodone and then to Ultram and then to Suboxone but could not tolerate any of these medications.  The patient did have increased anxiety, he had paresthesias with these medication changes.  He developed some abdominal discomfort as well, he has had 6 emergency room visits since 20 December 2018, with 2 ER visits within the last 2 days.  The patient was felt to have mild pancreatitis on tramadol.  The patient was noted to have mild liver function enzyme elevations, mild lipase elevations, CT of the abdomen was unremarkable with exception that there was some distention of the bladder and of the ureters, urinalysis was unremarkable.  The patient was noted to have a elevated white blood count, he indicates that he has not been running fevers, chest x-ray showed evidence of mild bronchitis but he denies any coughing problems.  The patient has not been sleeping well.  Currently he is not having any pain, he is off all pain medications.  The patient has had difficulty maintaining fluid status, he denies any nausea or vomiting.  He is concerned about his underlying lab abnormalities.  He has been to his primary care doctor but no further work-up was initiated.  He was treated  several weeks ago for a possible parotid gland infection, he was placed on Keflex.  The patient was having headaches at one point, this is no longer a problem.   Observations/Objective: The virtual visit reveals that the patient is alert and cooperative, speech is normal, extraocular movements are full.  No aphasia or dysarthria is noted.  Assessment and Plan: 1.  Elevated white blood count  2.  Elevated liver enzymes  3.  Recent mild pancreatitis  The patient appears to have multiple complaints that do not appear to be primarily neurologically based.  The patient is concerned about his general health, he feels that he has not been evaluated fully through his primary care physician.  The patient will come in for further blood work evaluation, clearly there appears to be an inflammatory process at work, his white blood count has been as high as 16.7.  I will contact him with the blood work results.  Follow Up Instructions: Follow-up through this office if needed.   I discussed the assessment and treatment plan with the patient. The patient was provided an opportunity to ask questions and all were answered. The patient agreed with the plan and demonstrated an understanding of the instructions.   The patient was advised to call back or seek an in-person evaluation if the symptoms worsen or if the condition fails to improve as anticipated.  I provided 30 minutes of non-face-to-face time during this encounter.   Kathrynn Ducking, MD

## 2019-01-19 NOTE — Telephone Encounter (Signed)
I contacted the pt and was able to discuss this message. I advised I looked on [facility] website and confirmed a location in [place].  Address [address], [place] [address]  Phone [phone] Fax [phone]  Orders have been faxed and confirmation received.

## 2019-01-19 NOTE — Telephone Encounter (Signed)
Due to current COVID 19 pandemic, our office is severely reducing in office visits for at least the next 2 weeks, in order to minimize the risk to our patients and healthcare providers. Pt understands that although there may be some limitations with this type of visit, we will take all precautions to reduce any security or privacy concerns.  Pt understands that this will be treated like an in office visit and we will file with pt's insurance, and there may be a patient responsible charge related to this service.

## 2019-01-20 LAB — COMPREHENSIVE METABOLIC PANEL
ALT: 48 IU/L — ABNORMAL HIGH (ref 0–44)
AST: 27 IU/L (ref 0–40)
Albumin/Globulin Ratio: 2 (ref 1.2–2.2)
Albumin: 4.5 g/dL (ref 4.0–5.0)
Alkaline Phosphatase: 76 IU/L (ref 39–117)
BUN/Creatinine Ratio: 10 (ref 9–20)
BUN: 8 mg/dL (ref 6–24)
Bilirubin Total: 0.3 mg/dL (ref 0.0–1.2)
CO2: 23 mmol/L (ref 20–29)
Calcium: 9.9 mg/dL (ref 8.7–10.2)
Chloride: 101 mmol/L (ref 96–106)
Creatinine, Ser: 0.82 mg/dL (ref 0.76–1.27)
GFR calc Af Amer: 119 mL/min/{1.73_m2} (ref >59)
GFR calc non Af Amer: 103 mL/min/{1.73_m2} (ref >59)
Globulin, Total: 2.3 g/dL (ref 1.5–4.5)
Glucose: 90 mg/dL (ref 65–99)
Potassium: 4.5 mmol/L (ref 3.5–5.2)
Sodium: 139 mmol/L (ref 134–144)
Total Protein: 6.8 g/dL (ref 6.0–8.5)

## 2019-01-20 LAB — ANGIOTENSIN CONVERTING ENZYME: Angio Convert Enzyme: 28 U/L (ref 14–82)

## 2019-01-20 LAB — B. BURGDORFI ANTIBODIES: Lyme IgG/IgM Ab: <0.91 {ISR} (ref 0.00–0.90)

## 2019-01-20 LAB — HIV ANTIBODY (ROUTINE TESTING W REFLEX): HIV Screen 4th Generation wRfx: NONREACTIVE

## 2019-01-20 LAB — ANA W/REFLEX: Anti Nuclear Antibody (ANA): NEGATIVE

## 2019-01-20 LAB — RHEUMATOID FACTOR: Rheumatoid fact SerPl-aCnc: <10 IU/mL (ref 0.0–13.9)

## 2019-01-20 LAB — LIPASE: Lipase: 41 U/L (ref 13–78)

## 2019-01-20 LAB — SEDIMENTATION RATE: Sed Rate: 28 mm/hr (ref 0–30)

## 2019-01-20 LAB — VITAMIN B12: Vitamin B-12: 341 pg/mL (ref 232–1245)

## 2019-01-20 LAB — HEPATITIS C ANTIBODY: Hep C Virus Ab: <0.1 s/co ratio (ref 0.0–0.9)

## 2019-01-20 LAB — HEPATITIS B SURFACE ANTIBODY,QUALITATIVE: Hep B Surface Ab, Qual: REACTIVE

## 2019-01-20 LAB — RPR: RPR Ser Ql: NONREACTIVE

## 2019-01-20 LAB — AMYLASE: Amylase: 39 U/L (ref 31–110)

## 2019-01-20 LAB — HEPATITIS B CORE ANTIBODY, TOTAL: Hep B Core Total Ab: NEGATIVE

## 2019-01-24 ENCOUNTER — Telehealth: Payer: Self-pay | Admitting: Neurology

## 2019-01-24 NOTE — Telephone Encounter (Signed)
Pt is asking for a call from RN, pt states he is having neurological problems , he is having difficulty speaking.  Pt states he has spoken with Dr Jannifer Franklin about this before.  Please call

## 2019-01-24 NOTE — Telephone Encounter (Signed)
Called patient, left message, I will call back later.

## 2019-01-24 NOTE — Telephone Encounter (Signed)
I called back again, the patient is in the shower, I will call back later.

## 2019-01-24 NOTE — Telephone Encounter (Signed)
Pt has called stating that he missed Dr Tobey Grim call.  Pt was told Dr Jannifer Franklin will call back. Pt asked if he should go to ED, pt told Dr Jannifer Franklin did not document that but if he feels the need to go he should. Please call

## 2019-01-24 NOTE — Telephone Encounter (Signed)
I called the patient.  The patient is having issues with diarrhea, numbness in the head and in the tongue, the sensation of drawing of the tongue, this has been a problem within 3 days after stopping Percocet, may be related to opiate withdrawal.  The patient has had a recent CT of the brain done in the last week.  This was normal.  I indicated to the patient if he still has persistent sensory symptoms over the next 2 weeks, he is to contact our office and we will consider MRI of the brain evaluation.  He claims that he did have recent blood work done and the ALT has gone up to around 99, I am not clear why this is occurring, his liver enzymes had almost normalized on the last evaluation through this office.

## 2019-01-24 NOTE — Telephone Encounter (Addendum)
Called patient who stated he stopped opiods on 01/16/19 because his "eyes restricted". He reports having 9 hours of consistent numbness on top of head, face, arms really weak, stated "my tongue goes in back of throat and I can hardly talk". He went to [facility] on 01/23/19 and had multiple labs drawn, stated his ALT is elevated again. He said every time he goes to hospital they tell him he is having opioid withdrawal.  He was told he was dehydrated as well. He stated "this is something neurological going on". I advised will send message to Dr Jannifer Franklin, and he'll get a call back. He verbalized understanding, appreciation.

## 2019-02-01 ENCOUNTER — Encounter: Payer: Medicare Other | Admitting: Hematology and Oncology

## 2019-02-07 ENCOUNTER — Other Ambulatory Visit: Payer: Self-pay

## 2019-02-07 NOTE — Progress Notes (Signed)
[facility]  [address] [place], [place] [address] Phone: [phone]  Fax: [phone]   Clinic Day:  02/08/2019  Referring physician: [name], MD  Chief Complaint: Adam Holloway is a 48 y.o. male with elevated WBC who is referred in consultation with [name], MD for assessment and management.   HPI:  The patient has chronic pain syndrome.  He has been to the [facility] ER 4 times since 12/20/2018.  He has been seen for an allergic reaction, multiple complaints (bilateral flank pain, chest pain, liver concerns, and possible dehydration), stress/anxiety, and "hands turning black and tachycardia".  Notes reviewed.  Abdomen and pelvic CT on 12/22/2018 revealed no acute findings in the abdomen or pelvis. He had a distended urinary bladder.  There was prominence of the intrarenal collecting systems and ureters bilaterally likely related to the bladder distention.  CXR on 12/24/2018 revealed mild changes of acute bronchitis and/or asthma without focal airspace pneumonia.  He was noted to have elevated WBCs on blood work done by his neurologist, Dr. Margette Fast, on 01/19/2019.  Workup showed: AST 27, ALT 48. Hepatitis B surface antibody positive and hepatitis B core total antibody were negative. Hepatitis C antibody was negative. HIV was negative. Burgdorferi antibodies were negative.  ANA and ACE negative.  Rheumatoid factor < 10.  RPR was negative.  Sed rate 28 (0-30).  Amylase 39.  Lipase 41. B12 was 341.   WBC has been followed: 7,700 on 01/07/2017, 14,300 on 12/20/2018, 16,700 on 12/22/2018, 11,300 on 12/24/2018, 16,500 on 01/17/2019, 13,800 on 01/18/2019 and 14,100 on 01/23/2019.  He has a history of chronic pain syndrome, for which he is followed by a pain center.   Labs on 01/23/2019 at [facility] WBC 14,100 (ANC 10,200), hemoglobin 13.9, hematocrit 41.5, platelets 210,000. Creatinine was 0.68. AST 46, ALT 99.   Symptomatically, he reports "I  don't feel too great."  He has not felt well in a year. He has severe fatigue 10-15 minutes after waking up and walking around each day. His activity level has decreased.  He has low energy levels and lack of interest in doing activities. He described cloudy, foul smelling urine yesterday, which has resolved today. He notes symptoms are intermittent. He denies any burning or urgency.   He reports he lost 14lbs following a gland infection and pancreatitis in 12/2018.  He has gained 3lbs back by forcing himself to eat. He denies fever or chills. He occasionally has vision issues. He has pain in his upper right abdomen, which he attributes to bronchitis long time ago. He has diarrhea that he attributes to stopping pain medication. He notes his BMs are "getting back to normal." He report bone pain in his left calf.  He denies any skin issues since having "sticky skin" during his gland infection.   He has not taken any pain medication in 22 days after taking them for 1.5 years for sciatica and wrist surgery, and hip and back issues. He has herniated discs and scoliosis. He has back spasms. He stopped taking his pain medications due to pounding in his heart and vision issues.   He has had issues switching between tramadol, oxycodone, and suboxone with numbness and vision changes. He is not currently taking steroids.   He has a family history of multiple myeloma. He denies any dental issues.    Past Medical History:  Diagnosis Date  . Arthritis    back, left wrist, left knee  . Complication of anesthesia  bp dropped during gallbladder surgery in 2008  . Depression   . Family history of adverse reaction to anesthesia    mom stopped breathing during colonscopy  . GERD (gastroesophageal reflux disease)    h/o  . HA (headache)    h/o migrianes  . Herniated disc, cervical   . History of kidney stones    h/o stones  . Lumbago   . Pain in soft tissues of limb   . PTSD (post-traumatic stress  disorder)   . Scoliosis     Past Surgical History:  Procedure Laterality Date  . CHOLECYSTECTOMY  2008  . COLONOSCOPY  2010  . COLONOSCOPY WITH PROPOFOL N/A 06/09/2016   Procedure: COLONOSCOPY WITH PROPOFOL;  Surgeon: Christene Lye, MD;  Location: [facility] ENDOSCOPY;  Service: Endoscopy;  Laterality: N/A;  . FISSURECTOMY N/A 06/29/2016   Procedure: FISSURECTOMY;  Surgeon: Christene Lye, MD;  Location: [facility] ORS;  Service: General;  Laterality: N/A;  . HEMORRHOID SURGERY N/A 06/29/2016   Procedure: HEMORRHOIDECTOMY;  Surgeon: Christene Lye, MD;  Location: [facility] ORS;  Service: General;  Laterality: N/A;  . PAROTIDECTOMY Left 1997  . SALIVARY GLAND SURGERY  1997  . SPHINCTEROTOMY N/A 06/29/2016   Procedure: SPHINCTEROTOMY;  Surgeon: Christene Lye, MD;  Location: [facility] ORS;  Service: General;  Laterality: N/A;  . WRIST ARTHROSCOPY Left 01/26/2017   Procedure: ARTHROSCOPY LEFT WRIST DEBRIDEMENT;  Surgeon: Daryll Brod, MD;  Location: [facility];  Service: Orthopedics;  Laterality: Left;  . WRIST SURGERY  2019    Family History  Problem Relation Age of Onset  . Cancer Father   . Heart Problems Father   . Lung cancer Father   . Colon cancer Mother   . Liver cancer Brother     Social History:  reports that he quit smoking about 3 weeks ago. His smoking use included cigarettes. He has a 16.00 pack-year smoking history. He has quit using smokeless tobacco. He reports that he does not drink alcohol or use drugs. He does not drink alcohol, and stopped in 2006. He smoked a pack a day for 30 years until 01/16/2019.  He denies any known exposure to radiation or toxins. He does not currently work, but previously worked in Psychologist, educational and for a Engineer, civil (consulting). He is on disability for his back issues and PTSD. The patient is alone today.  Allergies:  Allergies  Allergen Reactions  . Augmentin [Amoxicillin-Pot Clavulanate] Anaphylaxis  .  Hydrocodone-Acetaminophen Shortness Of Breath  . Nsaids Other (See Comments)    Chest pain  . Tolmetin Other (See Comments)    Chest pain  . Meloxicam Rash    Current Medications: Current Outpatient Medications  Medication Sig Dispense Refill  . alum & mag hydroxide-simeth (ALMACONE DOUBLE STRENGTH) 400-400-40 MG/5ML suspension Take 5 mLs by mouth every 6 (six) hours as needed.     . famotidine (PEPCID) 40 MG tablet Take 40 mg by mouth at bedtime.     . fluticasone furoate-vilanterol (BREO ELLIPTA) 100-25 MCG/INH AEPB Inhale 1 puff into the lungs daily.    . ondansetron (ZOFRAN-ODT) 4 MG disintegrating tablet Take 4 mg by mouth every 8 (eight) hours as needed.     No current facility-administered medications for this visit.     Review of Systems  Constitutional: Positive for malaise/fatigue and weight loss (14lbs in 3 days; improving). Negative for chills, diaphoresis and fever.       "I don't feel too great."  Low energy level.  HENT:  Negative for congestion, hearing loss, sinus pain and sore throat.   Eyes: Negative for blurred vision.       Occasional vision issues.  Respiratory: Negative for cough, sputum production and shortness of breath.   Cardiovascular: Positive for chest pain (right sided). Negative for palpitations, orthopnea, leg swelling and PND.  Gastrointestinal: Positive for abdominal pain (right upper quadrant) and diarrhea. Negative for blood in stool, constipation, heartburn, nausea and vomiting.  Genitourinary: Negative for dysuria, frequency and urgency.       Occasional cloudy, foul smelling urine.  Musculoskeletal: Positive for back pain (herniated discs) and joint pain (wrist). Negative for myalgias and neck pain.  Skin: Negative for rash.  Neurological: Positive for sensory change (left calf, occasioanlly in right side of face) and weakness (generalized). Negative for dizziness, tingling and headaches.  Endo/Heme/Allergies: Does not bruise/bleed easily.   Psychiatric/Behavioral: Negative for depression and memory loss. The patient is not nervous/anxious and does not have insomnia.   All other systems reviewed and are negative.  Performance status (ECOG):  2  Physical Exam  Constitutional: He is oriented to person, place, and time. He appears well-developed and well-nourished. No distress.  HENT:  Head: Normocephalic and atraumatic.  Mouth/Throat: Oropharynx is clear and moist. No oropharyngeal exudate.  Shot brown hair.  Eyes: Pupils are equal, round, and reactive to light. Conjunctivae and EOM are normal. No scleral icterus.  Blue eyes.  Neck: Normal range of motion. Neck supple. No JVD present.  Cardiovascular: Normal rate, regular rhythm and normal heart sounds. Exam reveals no gallop.  No murmur heard. Pulmonary/Chest: Effort normal and breath sounds normal. No respiratory distress. He has no wheezes. He has no rales.  Abdominal: Soft. Bowel sounds are normal. He exhibits no distension and no mass. There is abdominal tenderness in the right upper quadrant. There is no rebound and no guarding.  Musculoskeletal: Normal range of motion.        General: Tenderness (left calf) present. No edema.  Lymphadenopathy:    He has no cervical adenopathy.    He has no axillary adenopathy.       Right: No supraclavicular adenopathy present.       Left: No supraclavicular adenopathy present.  Neurological: He is alert and oriented to person, place, and time.  Skin: Skin is warm and dry. No rash noted. He is not diaphoretic. No erythema. No pallor.  Vitiligo.  Psychiatric: He has a normal mood and affect.  Nursing note and vitals reviewed.   No visits with results within 3 Day(s) from this visit.  Latest known visit with results is:  Orders Only on 01/19/2019  Component Date Value Ref Range Status  . Glucose 01/19/2019 90  65 - 99 mg/dL Final  . BUN 01/19/2019 8  6 - 24 mg/dL Final  . Creatinine, Ser 01/19/2019 0.82  0.76 - 1.27 mg/dL Final   . GFR calc non Af Amer 01/19/2019 103  >59 mL/min/1.73 Final  . GFR calc Af Amer 01/19/2019 119  >59 mL/min/1.73 Final  . BUN/Creatinine Ratio 01/19/2019 10  9 - 20 Final  . Sodium 01/19/2019 139  134 - 144 mmol/L Final  . Potassium 01/19/2019 4.5  3.5 - 5.2 mmol/L Final  . Chloride 01/19/2019 101  96 - 106 mmol/L Final  . CO2 01/19/2019 23  20 - 29 mmol/L Final  . Calcium 01/19/2019 9.9  8.7 - 10.2 mg/dL Final  . Total Protein 01/19/2019 6.8  6.0 - 8.5 g/dL Final  . Albumin 01/19/2019 4.5  4.0 - 5.0 g/dL Final  . Globulin, Total 01/19/2019 2.3  1.5 - 4.5 g/dL Final  . Albumin/Globulin Ratio 01/19/2019 2.0  1.2 - 2.2 Final  . Bilirubin Total 01/19/2019 0.3  0.0 - 1.2 mg/dL Final  . Alkaline Phosphatase 01/19/2019 76  39 - 117 IU/L Final  . AST 01/19/2019 27  0 - 40 IU/L Final  . ALT 01/19/2019 48* 0 - 44 IU/L Final  . Hep B Surface Ab, Qual 01/19/2019 Reactive   Final   Comment:               Non Reactive: Inconsistent with immunity,                             less than 10 mIU/mL               Reactive:     Consistent with immunity,                             greater than 9.9 mIU/mL   . Rhuematoid fact SerPl-aCnc 01/19/2019 <10.0  0.0 - 13.9 IU/mL Final  . Angio Convert Enzyme 01/19/2019 28  14 - 82 U/L Final  . RPR Ser Ql 01/19/2019 Non Reactive  Non Reactive Final  . HIV Screen 4th Generation wRfx 01/19/2019 Non Reactive  Non Reactive Final  . Hep C Virus Ab 01/19/2019 <0.1  0.0 - 0.9 s/co ratio Final   Comment:                                   Negative:     < 0.8                              Indeterminate: 0.8 - 0.9                                   Positive:     > 0.9  The CDC recommends that a positive HCV antibody result  be followed up with a HCV Nucleic Acid Amplification  test (409811).   . Lyme IgG/IgM Ab 01/19/2019 <0.91  0.00 - 0.90 ISR Final   Comment:                                 Negative         <0.91                                 Equivocal  0.91 - 1.09                                  Positive         >1.09   . Anti Nuclear Antibody (ANA) 01/19/2019 Negative  Negative Final  . Sed Rate 01/19/2019 28  0 - 30 mm/hr Final  . Amylase 01/19/2019 39  31 - 110 U/L Final  . Lipase 01/19/2019 41  13 - 78 U/L Final  . Vitamin B-12 01/19/2019 341  232 - 1,245 pg/mL Final  .  Hep B Core Total Ab 01/19/2019 Negative  Negative Final    Assessment:  Adam Holloway is a 48 y.o. male with mild leukocytosis felt reactive.  WBC has ranged between 11,300 - 16,700 in the past 2 months.  Work-up on 01/19/2019 revealed the following normal labs:  hepatitis, hepatitis B core total antibody, hepatitis C antibody, HIV testing, Burgdorferi antibodies, ANA, ACE, rheumatoid factor, RPR, sed rate, amylase, and lipase.  ALT was 48 (0-44).  B12 was 341 (low normal).   He has a 30 pack year smoking history.    Symptomatically, he notes a multitude of complaints.  He notes right sided chest pain.  Exam reveals no adenopathy or hepatosplenomegaly.  Plan: 1.   Labs today:  CBC with diff, sed rate, CRP. 2.   Peripheral smear for path review. 3.   Mild leukocytosis  Etiology appears to be reactive.  Differential is unremarkable and predominantly neutrophils.  Reassure patient.  Discuss limited evaluation. 4.   Right sided chest pain  Etiology unclear.  Patient with a significant smoking history.  Schedule chest CT. 5.   RTC after chest CT for MD assessment (Doximity) and review of work-up.  I discussed the assessment and treatment plan with the patient.  The patient was provided an opportunity to ask questions and all were answered.  The patient agreed with the plan and demonstrated an understanding of the instructions.  The patient was advised to call back if the symptoms worsen or if the condition fails to improve as anticipated.  I provided 45 minutes of face-to-face time during this this encounter and > 50% was spent counseling as documented under my assessment and  plan.     C. Mike Gip, MD, PhD    02/08/2019, 2:12 PM  I, Molly Dorshimer, am acting as Education administrator for [employer]. Mike Gip, MD, PhD.  I,  C. Mike Gip, MD, have reviewed the above documentation for accuracy and completeness, and I agree with the above.

## 2019-02-08 ENCOUNTER — Ambulatory Visit: Payer: Medicare Other

## 2019-02-08 ENCOUNTER — Encounter: Payer: Self-pay | Admitting: Hematology and Oncology

## 2019-02-08 ENCOUNTER — Inpatient Hospital Stay: Payer: Medicare Other | Attending: Hematology and Oncology | Admitting: Hematology and Oncology

## 2019-02-08 VITALS — BP 129/74 | HR 78 | Temp 97.4°F | Resp 16 | Ht 71.0 in | Wt 231.2 lb

## 2019-02-08 DIAGNOSIS — M1990 Unspecified osteoarthritis, unspecified site: Secondary | ICD-10-CM | POA: Insufficient documentation

## 2019-02-08 DIAGNOSIS — D72829 Elevated white blood cell count, unspecified: Secondary | ICD-10-CM | POA: Diagnosis present

## 2019-02-08 DIAGNOSIS — F17210 Nicotine dependence, cigarettes, uncomplicated: Secondary | ICD-10-CM

## 2019-02-08 DIAGNOSIS — R5383 Other fatigue: Secondary | ICD-10-CM

## 2019-02-08 DIAGNOSIS — R000 Tachycardia, unspecified: Secondary | ICD-10-CM | POA: Diagnosis not present

## 2019-02-08 DIAGNOSIS — R5381 Other malaise: Secondary | ICD-10-CM | POA: Insufficient documentation

## 2019-02-08 DIAGNOSIS — M5430 Sciatica, unspecified side: Secondary | ICD-10-CM | POA: Diagnosis not present

## 2019-02-08 DIAGNOSIS — R531 Weakness: Secondary | ICD-10-CM | POA: Diagnosis not present

## 2019-02-08 DIAGNOSIS — G894 Chronic pain syndrome: Secondary | ICD-10-CM | POA: Insufficient documentation

## 2019-02-08 DIAGNOSIS — R079 Chest pain, unspecified: Secondary | ICD-10-CM | POA: Diagnosis not present

## 2019-02-08 DIAGNOSIS — M62830 Muscle spasm of back: Secondary | ICD-10-CM

## 2019-02-08 DIAGNOSIS — Z79899 Other long term (current) drug therapy: Secondary | ICD-10-CM | POA: Insufficient documentation

## 2019-02-08 DIAGNOSIS — M419 Scoliosis, unspecified: Secondary | ICD-10-CM

## 2019-02-08 DIAGNOSIS — R197 Diarrhea, unspecified: Secondary | ICD-10-CM | POA: Diagnosis not present

## 2019-02-08 DIAGNOSIS — M79662 Pain in left lower leg: Secondary | ICD-10-CM

## 2019-02-08 LAB — CBC WITH DIFFERENTIAL/PLATELET
Abs Immature Granulocytes: 0.03 10*3/uL (ref 0.00–0.07)
Basophils Absolute: 0 10*3/uL (ref 0.0–0.1)
Basophils Relative: 0 %
Eosinophils Absolute: 0.1 10*3/uL (ref 0.0–0.5)
Eosinophils Relative: 1 %
HCT: 46.3 % (ref 39.0–52.0)
Hemoglobin: 15.5 g/dL (ref 13.0–17.0)
Immature Granulocytes: 0 %
Lymphocytes Relative: 25 %
Lymphs Abs: 2.7 10*3/uL (ref 0.7–4.0)
MCH: 31 pg (ref 26.0–34.0)
MCHC: 33.5 g/dL (ref 30.0–36.0)
MCV: 92.6 fL (ref 80.0–100.0)
Monocytes Absolute: 0.8 10*3/uL (ref 0.1–1.0)
Monocytes Relative: 7 %
Neutro Abs: 7.1 10*3/uL (ref 1.7–7.7)
Neutrophils Relative %: 67 %
Platelets: 195 10*3/uL (ref 150–400)
RBC: 5 MIL/uL (ref 4.22–5.81)
RDW: 14.5 % (ref 11.5–15.5)
WBC: 10.7 10*3/uL — ABNORMAL HIGH (ref 4.0–10.5)
nRBC: 0 % (ref 0.0–0.2)

## 2019-02-08 LAB — C-REACTIVE PROTEIN: CRP: <0.8 mg/dL (ref <1.0)

## 2019-02-08 NOTE — Progress Notes (Signed)
Pt here as new patient referral. Patient reports WBC "just goes up and down". Feeling weak starting 4/11 after pancreatitis. Patient reports he lost 14 lbs in 3 days - was around 250 lbs per patient. Pt states "something is wrong"

## 2019-02-09 LAB — PATHOLOGIST SMEAR REVIEW

## 2019-02-12 DIAGNOSIS — R079 Chest pain, unspecified: Secondary | ICD-10-CM | POA: Insufficient documentation

## 2019-02-12 DIAGNOSIS — D72829 Elevated white blood cell count, unspecified: Secondary | ICD-10-CM | POA: Insufficient documentation

## 2019-02-15 ENCOUNTER — Ambulatory Visit
Admission: RE | Admit: 2019-02-15 | Discharge: 2019-02-15 | Disposition: A | Discharge status: Home / Self Care | Payer: Medicare Other | Source: Ambulatory Visit | Attending: Hematology and Oncology | Admitting: Hematology and Oncology

## 2019-02-15 ENCOUNTER — Other Ambulatory Visit: Payer: Self-pay

## 2019-02-15 ENCOUNTER — Telehealth: Payer: Self-pay

## 2019-02-15 DIAGNOSIS — R079 Chest pain, unspecified: Secondary | ICD-10-CM | POA: Diagnosis present

## 2019-02-15 DIAGNOSIS — D72829 Elevated white blood cell count, unspecified: Secondary | ICD-10-CM

## 2019-02-15 NOTE — Telephone Encounter (Signed)
I called the patient.  The patient has had some sensation of heaviness on the left side and some intermittent numbness that comes and goes, it is not persistent.  Given the ongoing symptoms, I will go ahead and get a face-to-face visit with him in the next several weeks.

## 2019-02-15 NOTE — Progress Notes (Signed)
[name]  [address] [place], [name] [address] Phone: [phone]  Fax: [phone]   Telemedicine Office Visit:  02/20/2019  Referring physician: [name], MD  I connected with Adam Holloway on 02/20/2019 at 9:11 AM by videoconferencing and verified that I was speaking with the correct person using 2 identifiers.  The patient was at home in his backyard.  I discussed the limitations, risk, security and privacy concerns of performing an evaluation and management service by videoconferencing and the availability of in person appointments.  I also discussed with the patient that there may be a patient responsible charge related to this service.  The patient expressed understanding and agreed to proceed.   Chief Complaint: Adam Holloway is a 48 y.o. male with leukocytosis who is seen for review of work-up and discussion regarding direction of therapy.   HPI: The patient was last seen in the hematology clinic on 02/08/2019. At that time, he noted a multitude of complaints.  He had right sided chest pain. Exam revealed no adenopathy or hepatosplenomegaly.   Work-up revealed a hematocrit of 46.3, hemoglobin 15.5, MCV 92.6, platelets 195,000, WBC 10,700 with an ANC of 7100.  Differential was unremarkable.  CRP was < 0.8 (normal).  BCR-ABL was negative.    Peripheral smear revealed persistent mild leukocytosis. There was mild non specific leukocytosis with mature neutrophils and lymphocytes. There was occasional reactive appearing lymphocytes. There was no shift in immaturity. RBC morphology was unremarkable.  Platelets appeared mildly increased in size.  Overall the morphologic findings were not specific and are likely reactive to another process. A hematologic neoplastic process was not favored.   Chest CT on 02/15/2019 revealed no acute cardiopulmonary disease.  There were scattered coronary artery calcifications, mild bilateral gynecomastia, and  aortic atherosclerosis  During the interim, he reports continued right sided chest pain and pain in his sternum. He has continued fatigue, and "heavy feeling," where he can't lift his arms over his head. He attributes this to opioid withdrawal.   He will start a B-12 supplement.    Past Medical History:  Diagnosis Date  . Arthritis    back, left wrist, left knee  . Complication of anesthesia    bp dropped during gallbladder surgery in 2008  . Depression   . Family history of adverse reaction to anesthesia    mom stopped breathing during colonscopy  . GERD (gastroesophageal reflux disease)    h/o  . HA (headache)    h/o migrianes  . Herniated disc, cervical   . History of kidney stones    h/o stones  . Lumbago   . Pain in soft tissues of limb   . PTSD (post-traumatic stress disorder)   . Scoliosis     Past Surgical History:  Procedure Laterality Date  . CHOLECYSTECTOMY  2008  . COLONOSCOPY  2010  . COLONOSCOPY WITH PROPOFOL N/A 06/09/2016   Procedure: COLONOSCOPY WITH PROPOFOL;  Surgeon: Christene Lye, MD;  Location: [facility];  Service: Endoscopy;  Laterality: N/A;  . FISSURECTOMY N/A 06/29/2016   Procedure: FISSURECTOMY;  Surgeon: Christene Lye, MD;  Location: [facility];  Service: General;  Laterality: N/A;  . HEMORRHOID SURGERY N/A 06/29/2016   Procedure: HEMORRHOIDECTOMY;  Surgeon: Christene Lye, MD;  Location: [facility];  Service: General;  Laterality: N/A;  . PAROTIDECTOMY Left 1997  . SALIVARY GLAND SURGERY  1997  . SPHINCTEROTOMY N/A 06/29/2016   Procedure: SPHINCTEROTOMY;  Surgeon: Christene Lye, MD;  Location: [facility];  Service: General;  Laterality: N/A;  . WRIST ARTHROSCOPY Left 01/26/2017   Procedure: ARTHROSCOPY LEFT WRIST DEBRIDEMENT;  Surgeon: Daryll Brod, MD;  Location: [facility];  Service: Orthopedics;  Laterality: Left;  . WRIST SURGERY  2019    Family History  Problem Relation Age of Onset  . Cancer  Father   . Heart Problems Father   . Lung cancer Father   . Colon cancer Mother   . Liver cancer Brother     Social History:  reports that he quit smoking about 5 weeks ago. His smoking use included cigarettes. He has a 16.00 pack-year smoking history. He has quit using smokeless tobacco. He reports that he does not drink alcohol or use drugs. He does not drink alcohol, and stopped in 2006. He smoked a pack a day for 30 years until 01/16/2019.  He denies any known exposure to radiation or toxins. He does not currently work, but previously worked in Psychologist, educational and for a Engineer, civil (consulting). He is on disability for his back issues and PTSD. The patient is alone today.  Participants in the patient's visit and their role in the encounter included the patient and Vito Berger, CMA, today.  The intake visit was provided by Vito Berger, CMA.  Allergies:  Allergies  Allergen Reactions  . Augmentin [Amoxicillin-Pot Clavulanate] Anaphylaxis  . Hydrocodone-Acetaminophen Shortness Of Breath  . Nsaids Other (See Comments)    Chest pain  . Tolmetin Other (See Comments)    Chest pain  . Meloxicam Rash    Current Medications: Current Outpatient Medications  Medication Sig Dispense Refill  . famotidine (PEPCID) 40 MG tablet Take 40 mg by mouth at bedtime.     . fluticasone furoate-vilanterol (BREO ELLIPTA) 100-25 MCG/INH AEPB Inhale 1 puff into the lungs daily.    Marland Kitchen alum & mag hydroxide-simeth (ALMACONE DOUBLE STRENGTH) 400-400-40 MG/5ML suspension Take 5 mLs by mouth every 6 (six) hours as needed.     . ondansetron (ZOFRAN-ODT) 4 MG disintegrating tablet Take 4 mg by mouth every 8 (eight) hours as needed.     No current facility-administered medications for this visit.     Review of Systems  Constitutional: Positive for malaise/fatigue. Negative for chills, diaphoresis, fever and weight loss.       Low energy level.  HENT: Negative for congestion, hearing loss, sinus pain and sore  throat.   Eyes: Negative for blurred vision.  Respiratory: Negative for cough, sputum production and shortness of breath.   Cardiovascular: Positive for chest pain (right sided, and sternum). Negative for palpitations, orthopnea, leg swelling and PND.  Gastrointestinal: Positive for abdominal pain (right upper quadrant). Negative for blood in stool, constipation, diarrhea, heartburn, nausea and vomiting.  Genitourinary: Negative for dysuria, frequency and urgency.  Musculoskeletal: Negative for back pain, joint pain, myalgias and neck pain.  Skin: Negative for rash.  Neurological: Positive for weakness. Negative for dizziness, tingling, sensory change and headaches.  Endo/Heme/Allergies: Does not bruise/bleed easily.  Psychiatric/Behavioral: Negative for depression and memory loss. The patient is not nervous/anxious and does not have insomnia.   All other systems reviewed and are negative.   Performance status (ECOG): 2  Physical Exam  Constitutional: He is oriented to person, place, and time. He appears well-developed and well-nourished. No distress.  HENT:  Short brown hair.  Eyes: Pupils are equal, round, and reactive to light. Conjunctivae and EOM are normal. No scleral icterus.  Blue eyes.  Neurological: He is alert and oriented  to person, place, and time.  Psychiatric: He has a normal mood and affect. His behavior is normal. Judgment and thought content normal.  Nursing note reviewed.   No visits with results within 3 Day(s) from this visit.  Latest known visit with results is:  Office Visit on 02/08/2019  Component Date Value Ref Range Status  . Specimen Type 02/08/2019 BLOOD   Final  . Cells Counted 02/08/2019 200   Final  . Cells Analyzed 02/08/2019 200   Final  . FISH Result 02/08/2019 Comment:   Final   NORMAL:  NO BCR OR ABL1 GENE REARRANGEMENT OBSERVED  . Interpretation 02/08/2019 Comment:   Final   Comment: (NOTE)             nuc ish  9q34(ASS1,ABL1)x2,22q11.2(BCRx2)[200].      The fluorescence in situ hybridization (FISH) study was normal. FISH, using unique sequence DNA probes for the ABL1 and BCR gene regions showed two ABL1 signals (red), two control ASS1 gene signals (aqua) located adjacent to the ABL1 locus at 9q34, and two BCR signals (green) at 22q11.2 in all interphase nuclei examined. There was NO evidence of CML or ALL-associated BCR/ABL1 dual fusion signals in this analysis. .      This analysis is limited to abnormalities detectable by the specific probes included in the study. FISH results should be interpreted within the context of a full cytogenetic analysis and pathology evaluation. .      This test was developed and its performance characteristics determined by [employer]). It has not been cleared or approved by the U.S. Food and Drug Administration. A BCR-ABL1 gene fusion in greater than 3 interphase nuclei in a                           patient with a new clinical diagnosis is considered positive. The DNA probe vendor for this study was [employer], research (physical sciences)).   . Director Review: 02/08/2019 Comment:   Final   Comment: (NOTE) Laurie Panda, PHD Performed At: [facility] [address] [place], [place] [id number] Nechama Guard MD QM:[id number] Performed At: [facility] [address] [place], [place] [id number] Rush Farmer MD UU:[id number]   . CRP 02/08/2019 <0.8  <1.0 mg/dL Final   Performed at [facility] [address], [place], [place] [address]  . Path Review 02/08/2019 Peripheral blood smear is reviewed.    Final   Comment: Patient with persistent mild leukocytosis. Mild non specific leukocytosis with mature neutrophils and lymphocytes. Occasional reactive appearing lymphocytes. There is no shift in immaturity. Unremarkable RBC morphology. Platelets appear mildly increased in size. Overall the  morphologic findings are not specific and are likely reactive to another process. A hematologic neoplastic process is not favored.  Reviewed by Dellia Nims Reuel Derby, M.D. Performed at [facility], [address], [place], [place] [address]   . WBC 02/08/2019 10.7* 4.0 - 10.5 K/uL Final  . RBC 02/08/2019 5.00  4.22 - 5.81 MIL/uL Final  . Hemoglobin 02/08/2019 15.5  13.0 - 17.0 g/dL Final  . HCT 02/08/2019 46.3  39.0 - 52.0 % Final  . MCV 02/08/2019 92.6  80.0 - 100.0 fL Final  . MCH 02/08/2019 31.0  26.0 - 34.0 pg Final  . MCHC 02/08/2019 33.5  30.0 - 36.0 g/dL Final  . RDW 02/08/2019 14.5  11.5 - 15.5 % Final  . Platelets 02/08/2019 195  150 - 400  K/uL Final  . nRBC 02/08/2019 0.0  0.0 - 0.2 % Final  . Neutrophils Relative % 02/08/2019 67  % Final  . Neutro Abs 02/08/2019 7.1  1.7 - 7.7 K/uL Final  . Lymphocytes Relative 02/08/2019 25  % Final  . Lymphs Abs 02/08/2019 2.7  0.7 - 4.0 K/uL Final  . Monocytes Relative 02/08/2019 7  % Final  . Monocytes Absolute 02/08/2019 0.8  0.1 - 1.0 K/uL Final  . Eosinophils Relative 02/08/2019 1  % Final  . Eosinophils Absolute 02/08/2019 0.1  0.0 - 0.5 K/uL Final  . Basophils Relative 02/08/2019 0  % Final  . Basophils Absolute 02/08/2019 0.0  0.0 - 0.1 K/uL Final  . Immature Granulocytes 02/08/2019 0  % Final  . Abs Immature Granulocytes 02/08/2019 0.03  0.00 - 0.07 K/uL Final   Performed at [facility], [address], [place], [place] [address]    Assessment:  DIAMONTE STAVELY is a 48 y.o. male with mild leukocytosis felt reactive.  WBC has ranged between 11,300 - 16,700 in the past 2 months.  Work-up on 01/19/2019 revealed the following normal labs:  hepatitis, hepatitis B core total antibody, hepatitis C antibody, HIV testing, Burgdorferi antibodies, ANA, ACE, rheumatoid factor, RPR, sed rate, amylase, and lipase.  ALT was 48 (0-44).  B12 was 341 (low normal).   Work-up on 02/08/2019 revealed a hematocrit of 46.3,  hemoglobin 15.5, MCV 92.6, platelets 195,000, WBC 10,700 with an ANC of 7100.  Differential was unremarkable.  CRP was < 0.8 (normal).  BCR-ABL was negative.    Peripheral smear revealed persistent mild leukocytosis. There was mild non specific leukocytosis with mature neutrophils and lymphocytes. There was occasional reactive appearing lymphocytes. There was no shift in immaturity. RBC morphology was unremarkable.  Platelets appeared mildly increased in size.  Overall the morphologic findings were not specific and are likely reactive to another process. A hematologic neoplastic process was not favored.   He has a 30 pack year smoking history.  Chest CT on 02/15/2019 revealed no acute cardiopulmonary disease.  There were scattered coronary artery calcifications, mild bilateral gynecomastia, and aortic atherosclerosis  Symptomatically, he feels better.  He still has right sided chest and sternal pain.  Plan: 1.   Review work-up on 02/08/2019. 2.   Mild leukocytosis             Review work-up.  No evidence of a hematologic process.  Etiology likely reactive. 3.   Right sided chest pain             Etiology remains unclear.             Chest CT personally reviewed.  No acute pulmonary process.  Encourage follow-up with PCP. 4.   RTC prn.  I discussed the assessment and treatment plan with the patient.  The patient was provided an opportunity to ask questions and all were answered.  The patient agreed with the plan and demonstrated an understanding of the instructions.  The patient was advised to call back or seek an in person evaluation if the symptoms worsen or if the condition fails to improve as anticipated.  I provided 13 minutes (9:11 AM - 9:24 AM) of face-to-face video visit time during this this encounter and > 50% was spent counseling as documented under my assessment and plan.  I provided these services from the [facility] office.   Nolon Stalls, MD, PhD  02/20/2019, 9:11 AM  I,  Molly Dorshimer, am acting as scribe  for Melissa C. Mike Gip, MD, PhD.  I, Melissa C. Mike Gip, MD, have reviewed the above documentation for accuracy and completeness, and I agree with the above.

## 2019-02-15 NOTE — Telephone Encounter (Addendum)
Pt has sent an appointment request to discuss left side body weakness with continued numbness. he reports he has been without pain meds for 25 days and believes his symptoms are due to d/c the meds. Pt also stated he has some difficulty swallowing.   Pt was last seen on 01/19/19 and requested an appt for 6/3 or 6/4. Currently Dr. Eugenie Birks does not have any openings for these dates. Will send message to see what could be advised.

## 2019-02-16 LAB — BCR-ABL1 FISH
Cells Analyzed: 200
Cells Counted: 200

## 2019-02-16 NOTE — Telephone Encounter (Signed)
I reached out to the pt and left a vm letting him know I have scheduled a f/u for him on 03/13/19 at 1100 check in time of 10:30. Pt was provided [facility] # to call back if he had further questions.

## 2019-02-17 ENCOUNTER — Telehealth: Payer: Self-pay | Admitting: Hematology and Oncology

## 2019-02-20 ENCOUNTER — Inpatient Hospital Stay: Payer: Medicare Other | Attending: Hematology and Oncology | Admitting: Hematology and Oncology

## 2019-02-20 ENCOUNTER — Encounter: Payer: Self-pay | Admitting: Hematology and Oncology

## 2019-02-20 DIAGNOSIS — D72829 Elevated white blood cell count, unspecified: Secondary | ICD-10-CM

## 2019-02-20 DIAGNOSIS — E538 Deficiency of other specified B group vitamins: Secondary | ICD-10-CM

## 2019-02-20 DIAGNOSIS — R079 Chest pain, unspecified: Secondary | ICD-10-CM

## 2019-02-20 NOTE — Progress Notes (Signed)
The patient c/o upper chest pain ( pain level 4) occasional pain. The patient Name and DOB has been verified by phone today.

## 2019-03-13 ENCOUNTER — Other Ambulatory Visit: Payer: Self-pay

## 2019-03-13 ENCOUNTER — Encounter: Payer: Self-pay | Admitting: Neurology

## 2019-03-13 ENCOUNTER — Ambulatory Visit (INDEPENDENT_AMBULATORY_CARE_PROVIDER_SITE_OTHER): Payer: Medicare Other | Admitting: Neurology

## 2019-03-13 VITALS — BP 123/91 | HR 82 | Temp 98.7°F | Ht 71.0 in | Wt 241.3 lb

## 2019-03-13 DIAGNOSIS — M79605 Pain in left leg: Secondary | ICD-10-CM | POA: Diagnosis not present

## 2019-03-13 DIAGNOSIS — M5442 Lumbago with sciatica, left side: Secondary | ICD-10-CM | POA: Diagnosis not present

## 2019-03-13 DIAGNOSIS — G8929 Other chronic pain: Secondary | ICD-10-CM | POA: Diagnosis not present

## 2019-03-13 NOTE — Progress Notes (Signed)
Reason for visit: Paresthesias, leg pain  Adam Holloway is an 48 y.o. male  History of present illness:  Adam Holloway is a 48 year old right-handed white male with a history of some recent symptoms of paresthesias involving the face and tongue and left hand that came on around the time of withdrawal from opiate medications.  The patient did have an ER visit on 22 Jan 2019 for this reason.  The patient however comes in today with persistent symptoms involving the left leg, with a vibration sensation around the left ankle and a history of back pain with pain down the left leg.  The patient indicates that this predated his withdrawal from the opiate medications.  He feels that the left leg is somewhat heavy, he denies any significant balance issues or problems controlling the bowels or the bladder.  The patient has not had any falls.  He notes that the symptoms in the left foot and leg are worse with sitting or lying down, not as noticeable when he is up walking.  The episodes come and go, they may last anywhere from 2 hours to a half a day.  Past Medical History:  Diagnosis Date  . Arthritis    back, left wrist, left knee  . Complication of anesthesia    bp dropped during gallbladder surgery in 2008  . Depression   . Family history of adverse reaction to anesthesia    mom stopped breathing during colonscopy  . GERD (gastroesophageal reflux disease)    h/o  . HA (headache)    h/o migrianes  . Herniated disc, cervical   . History of kidney stones    h/o stones  . Lumbago   . Pain in soft tissues of limb   . PTSD (post-traumatic stress disorder)   . Scoliosis     Past Surgical History:  Procedure Laterality Date  . CHOLECYSTECTOMY  2008  . COLONOSCOPY  2010  . COLONOSCOPY WITH PROPOFOL N/A 06/09/2016   Procedure: COLONOSCOPY WITH PROPOFOL;  Surgeon: Christene Lye, MD;  Location: [facility];  Service: Endoscopy;  Laterality: N/A;  . FISSURECTOMY N/A 06/29/2016   Procedure: FISSURECTOMY;  Surgeon: Christene Lye, MD;  Location: [facility];  Service: General;  Laterality: N/A;  . HEMORRHOID SURGERY N/A 06/29/2016   Procedure: HEMORRHOIDECTOMY;  Surgeon: Christene Lye, MD;  Location: [facility];  Service: General;  Laterality: N/A;  . PAROTIDECTOMY Left 1997  . SALIVARY GLAND SURGERY  1997  . SPHINCTEROTOMY N/A 06/29/2016   Procedure: SPHINCTEROTOMY;  Surgeon: Christene Lye, MD;  Location: [facility];  Service: General;  Laterality: N/A;  . WRIST ARTHROSCOPY Left 01/26/2017   Procedure: ARTHROSCOPY LEFT WRIST DEBRIDEMENT;  Surgeon: Daryll Brod, MD;  Location: [facility];  Service: Orthopedics;  Laterality: Left;  . WRIST SURGERY  2019    Family History  Problem Relation Age of Onset  . Cancer Father   . Heart Problems Father   . Lung cancer Father   . Colon cancer Mother   . Liver cancer Brother     Social history:  reports that he quit smoking about 8 weeks ago. His smoking use included cigarettes. He has a 16.00 pack-year smoking history. He has quit using smokeless tobacco. He reports that he does not drink alcohol or use drugs.    Allergies  Allergen Reactions  . Augmentin [Amoxicillin-Pot Clavulanate] Anaphylaxis  . Hydrocodone-Acetaminophen Shortness Of Breath  . Nsaids Other (See Comments)    Chest pain  .  Tolmetin Other (See Comments)    Chest pain  . Meloxicam Rash    Medications:  Prior to Admission medications   Medication Sig Start Date End Date Taking? Authorizing Provider  fluticasone furoate-vilanterol (BREO ELLIPTA) 100-25 MCG/INH AEPB Inhale 1 puff into the lungs daily.   Yes [provider]  Multiple Vitamin (MULTIVITAMIN) capsule Take 1 capsule by mouth daily.   Yes [provider]    ROS:  Out of a complete 14 system review of symptoms, the patient complains only of the following symptoms, and all other reviewed systems are negative.  Right foot and ankle pain  Paresthesias of the left ankle Chronic low back pain  Blood pressure (!) 123/91, pulse 82, temperature 98.7 F (37.1 C), temperature source Temporal, height 5\' 11"  (1.803 m), weight 241 lb 5 oz (109.5 kg).  Physical Exam  General: The patient is alert and cooperative at the time of the examination.  Skin: No significant peripheral edema is noted.   Neurologic Exam  Mental status: The patient is alert and oriented x 3 at the time of the examination. The patient has apparent normal recent and remote memory, with an apparently normal attention span and concentration ability.   Cranial nerves: Facial symmetry is present. Speech is normal, no aphasia or dysarthria is noted. Extraocular movements are full. Visual fields are full.  Motor: The patient has good strength in all 4 extremities.  Sensory examination: Soft touch sensation is symmetric on the face, arms, and legs.  Pinprick sensation and vibration sensation are decreased on the left foot and leg as compared to the right.  Sensation to pinprick and vibration are symmetric on the arms and face.  Coordination: The patient has good finger-nose-finger and heel-to-shin bilaterally.  Gait and station: The patient has a normal gait. Tandem gait is normal. Romberg is negative. No drift is seen.  Reflexes: Deep tendon reflexes are symmetric.   Assessment/Plan:  1.  Chronic low back pain, left leg pain  2.  Sensory alteration, left ankle  The patient will be set up for nerve conduction studies of the left arm and both legs, the patient also complains of intermittent sensation alteration of the left fourth and fifth fingers.  He will have an EMG study on the left leg.  He will otherwise follow-up in 6 months.  Jill Alexanders MD 03/13/2019 11:57 AM  [facility] [address] [place], [place] [address]  Phone [phone] Fax [phone]

## 2019-04-12 ENCOUNTER — Other Ambulatory Visit: Payer: Self-pay

## 2019-04-12 ENCOUNTER — Ambulatory Visit (INDEPENDENT_AMBULATORY_CARE_PROVIDER_SITE_OTHER): Payer: Medicare Other | Admitting: Neurology

## 2019-04-12 ENCOUNTER — Encounter: Payer: Self-pay | Admitting: Neurology

## 2019-04-12 DIAGNOSIS — G609 Hereditary and idiopathic neuropathy, unspecified: Secondary | ICD-10-CM

## 2019-04-12 DIAGNOSIS — M79605 Pain in left leg: Secondary | ICD-10-CM

## 2019-04-12 DIAGNOSIS — R739 Hyperglycemia, unspecified: Secondary | ICD-10-CM

## 2019-04-12 DIAGNOSIS — G629 Polyneuropathy, unspecified: Secondary | ICD-10-CM | POA: Insufficient documentation

## 2019-04-12 HISTORY — DX: Polyneuropathy, unspecified: G62.9

## 2019-04-12 NOTE — Procedures (Signed)
     HISTORY:  Demetris Capell is a 48 year old patient with a history of some tingling in the left fourth and fifth fingers and some discomfort down the left leg.  The patient is being evaluated for possible neuropathy or a radiculopathy.  NERVE CONDUCTION STUDIES:  Nerve conduction studies were performed on the left upper extremity. The distal motor latencies and motor amplitudes for the median and ulnar nerves were within normal limits. The nerve conduction velocities for these nerves were also normal. The sensory latencies for the median and ulnar nerves were normal. The F wave latency for the ulnar nerve was within normal limits.  Nerve conduction studies were performed on both lower extremities.  The distal motor latencies for the peroneal nerves and posterior tibial nerves were normal bilaterally with a low motor amplitude for the right peroneal nerve, normal for the left peroneal nerve and for the posterior tibial nerves bilaterally.  Slowing was seen for the peroneal and posterior tibial nerves bilaterally.  The sensory latencies for the peroneal nerves were prolonged bilaterally, normal for the sural nerves bilaterally.  The F-wave latencies for the posterior tibial nerves were prolonged bilaterally.  EMG STUDIES:  EMG study was performed on the left lower extremity:  The tibialis anterior muscle reveals 2 to 4K motor units with full recruitment. No fibrillations or positive waves were seen. The peroneus tertius muscle reveals 2 to 4K motor units with decreased recruitment. No fibrillations or positive waves were seen. The medial gastrocnemius muscle reveals 1 to 3K motor units with full recruitment. No fibrillations or positive waves were seen. The vastus lateralis muscle reveals 2 to 4K motor units with full recruitment. No fibrillations or positive waves were seen. The iliopsoas muscle reveals 2 to 4K motor units with full recruitment. No fibrillations or positive waves were seen.  The biceps femoris muscle (long head) reveals 2 to 4K motor units with full recruitment. No fibrillations or positive waves were seen. The lumbosacral paraspinal muscles were tested at 3 levels, and revealed no abnormalities of insertional activity at all 3 levels tested. There was good relaxation.   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  There is no evidence of a neuropathy affecting the left arm.  EMG evaluation of the left lower extremity shows no evidence of an overlying lumbosacral radiculopathy.    Jill Alexanders MD 04/12/2019 3:09 PM  [facility] [address] [place], [place] [address]  Phone [phone] Fax [phone]

## 2019-04-12 NOTE — Progress Notes (Addendum)
The patient comes in for EMG nerve conduction study, he has had some discomfort down the left leg.  The nerve conduction studies show slowing in the lower extremities bilaterally consistent with a mild peripheral neuropathy.  EMG of the left leg does show some mild chronic neuropathic signs of denervation in the peroneal structures muscle in isolation, this could be consistent with a mild peripheral neuropathy.  No evidence of an overlying lumbosacral radiculopathy was seen.  Further blood work will be done today.      [place]    Nerve / Sites Muscle Latency Ref. Amplitude Ref. Rel Amp Segments Distance Velocity Ref. Area    ms ms mV mV %  cm m/s m/s mVms  L Median - APB     Wrist APB 3.3 ?4.4 7.9 ?4.0 100 Wrist - APB 7   23.1     Upper arm APB 7.8  7.8  98.9 Upper arm - Wrist 23 52 ?49 24.5  L Ulnar - ADM     Wrist ADM 2.5 ?3.3 11.8 ?6.0 100 Wrist - ADM 7   31.4     B.Elbow ADM 6.6  10.2  86.4 B.Elbow - Wrist 22 53 ?49 28.9     A.Elbow ADM 8.5  9.9  97.2 A.Elbow - B.Elbow 10 52 ?49 28.3         A.Elbow - Wrist      R Peroneal - EDB     Ankle EDB 5.7 ?6.5 1.8 ?2.0 100 Ankle - EDB 9   6.7     Fib head EDB 13.2  1.5  80 Fib head - Ankle 32 43 ?44 6.5     Pop fossa EDB 15.7  1.4  93.8 Pop fossa - Fib head 10 39 ?44 5.1         Pop fossa - Ankle      L Peroneal - EDB     Ankle EDB 5.5 ?6.5 5.2 ?2.0 100 Ankle - EDB 9   15.0     Fib head EDB 12.9  4.7  89.4 Fib head - Ankle 30 40 ?44 13.7     Pop fossa EDB 15.4  4.5  96.9 Pop fossa - Fib head 10 40 ?44 13.3         Pop fossa - Ankle      R Tibial - AH     Ankle AH 3.3 ?5.8 15.2 ?4.0 100 Ankle - AH 9   28.0     Pop fossa AH 13.3  2.1  13.8 Pop fossa - Ankle 39 39 ?41 7.4  L Tibial - AH     Ankle AH 3.8 ?5.8 15.5 ?4.0 100 Ankle - AH 9   30.3     Pop fossa AH 14.3  9.9  63.9 Pop fossa - Ankle 39 37 ?41 25.8                      [facility]    Nerve / Sites Rec. Site Peak Lat Ref.  Amp Ref. Segments Distance    ms ms V V  cm  R Sural -  Ankle (Calf)     Calf Ankle 3.8 ?4.4 8 ?6 Calf - Ankle 14  L Sural - Ankle (Calf)     Calf Ankle 3.9 ?4.4 10 ?6 Calf - Ankle 14  R Superficial peroneal - Ankle     Lat leg Ankle 4.6 ?4.4 6 ?6 Lat leg - Ankle 14  L Superficial peroneal -  Ankle     Lat leg Ankle 4.6 ?4.4 4 ?6 Lat leg - Ankle 14  L Median - Orthodromic (Dig II, Mid palm)     Dig II Wrist 2.7 ?3.4 14 ?10 Dig II - Wrist 13  L Ulnar - Orthodromic, (Dig V, Mid palm)     Dig V Wrist 2.6 ?3.1 8 ?5 Dig V - Wrist 27                 F  Wave    Nerve F Lat Ref.   ms ms  R Tibial - AH 57.1 ?56.0  L Tibial - AH 56.6 ?56.0  L Ulnar - ADM 29.1 ?32.0

## 2019-04-12 NOTE — Progress Notes (Signed)
Please refer to EMG and nerve conduction procedure note.  

## 2019-04-14 LAB — HEMOGLOBIN A1C
Est. average glucose Bld gHb Est-mCnc: 117 mg/dL
Hgb A1c MFr Bld: 5.7 % — ABNORMAL HIGH (ref 4.8–5.6)

## 2019-04-14 LAB — MULTIPLE MYELOMA PANEL, SERUM
Albumin SerPl Elph-Mcnc: 4.2 g/dL (ref 2.9–4.4)
Albumin/Glob SerPl: 1.5 (ref 0.7–1.7)
Alpha 1: 0.2 g/dL (ref 0.0–0.4)
Alpha2 Glob SerPl Elph-Mcnc: 0.8 g/dL (ref 0.4–1.0)
B-Globulin SerPl Elph-Mcnc: 1.1 g/dL (ref 0.7–1.3)
Gamma Glob SerPl Elph-Mcnc: 0.8 g/dL (ref 0.4–1.8)
Globulin, Total: 2.9 g/dL (ref 2.2–3.9)
IgA/Immunoglobulin A, Serum: 135 mg/dL (ref 90–386)
IgG (Immunoglobin G), Serum: 883 mg/dL (ref 603–1613)
IgM (Immunoglobulin M), Srm: 100 mg/dL (ref 20–172)
Total Protein: 7.1 g/dL (ref 6.0–8.5)

## 2019-04-14 LAB — COPPER, SERUM: Copper: 117 ug/dL (ref 72–166)

## 2019-04-14 LAB — TSH: TSH: 1.67 u[IU]/mL (ref 0.450–4.500)

## 2019-04-26 ENCOUNTER — Telehealth: Payer: Self-pay | Admitting: Neurology

## 2019-04-26 DIAGNOSIS — M5432 Sciatica, left side: Secondary | ICD-10-CM

## 2019-04-26 NOTE — Addendum Note (Signed)
Addended by: Kathrynn Ducking on: 04/26/2019 06:39 PM   Modules accepted: Orders

## 2019-04-26 NOTE — Telephone Encounter (Signed)
I called the patient.  The patient continues to have back pain and left leg discomfort, MRI of the low back done in 2012 did show a small disc herniation at the L5-S1 level at that time without nerve root compression.  EMG evaluation showed isolated denervation of the tibialis anterior muscle.  The clinical significance of this is not clear.  Given the ongoing sciatica symptoms and the mild abnormality by EMG, we will get MRI of the lumbar spine.

## 2019-04-26 NOTE — Telephone Encounter (Signed)
I reached out to the pt. He wanted to see if his 04/12/2019 blood test results were back. I advised Dr. Jannifer Franklin had reviewed and released to his my chart account.  Notes recorded by Kathrynn Ducking, MD on 04/16/2019 at 5:14 PM EDT  Blood work is unremarkable with exception of the hemoglobin A1c is very slightly elevated, consistent with mild borderline diabetes. Would recommend a low carbohydrate diet.  Pt verbalized understanding on the results but wanted to know what the next step would be? Pt reports his sx are the same and are not improving. Best call back # is [phone].

## 2019-04-26 NOTE — Telephone Encounter (Signed)
Pt is calling in to check the status of his lab results

## 2019-04-27 ENCOUNTER — Telehealth: Payer: Self-pay | Admitting: Neurology

## 2019-04-27 NOTE — Telephone Encounter (Signed)
Medicare/medicaid order sent to GI. No auth they will reach out to the patient to schedule.  °

## 2019-05-15 ENCOUNTER — Other Ambulatory Visit: Payer: Self-pay | Admitting: Neurology

## 2019-05-15 MED ORDER — METHOCARBAMOL 500 MG PO TABS: 500.0000 mg | ORAL_TABLET | Freq: Three times a day (TID) | ORAL | 1 refills | Status: DC

## 2019-05-19 ENCOUNTER — Ambulatory Visit
Admission: RE | Admit: 2019-05-19 | Discharge: 2019-05-19 | Disposition: A | Discharge status: Home / Self Care | Payer: Medicare Other | Source: Ambulatory Visit | Attending: Neurology | Admitting: Neurology

## 2019-05-19 ENCOUNTER — Other Ambulatory Visit: Payer: Self-pay

## 2019-05-19 DIAGNOSIS — M5432 Sciatica, left side: Secondary | ICD-10-CM

## 2019-05-22 ENCOUNTER — Telehealth: Payer: Self-pay | Admitting: Neurology

## 2019-05-22 NOTE — Telephone Encounter (Signed)
I called the patient.  No etiology by MRI identified for the back pain and sciatica pain.  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  If he is still have a lot of back pain could try an epidural steroid injection, he will let me know about this.    MRI lumbar 05/19/19:  IMPRESSION: This MRI of the lumbar spine without contrast shows degenerative changes as detailed above.  The most significant findings are:: 1.  At L4-L5, there is mild spinal stenosis due to facet hypertrophy and mild disc bulging.  There is mild lateral recess stenosis but no nerve root compression. 2.  At L5-S1, there is a small central disc bulge.  There is mild lateral recess stenosis but no spinal stenosis or nerve root compression.

## 2019-07-21 ENCOUNTER — Other Ambulatory Visit: Payer: Self-pay | Admitting: Internal Medicine

## 2019-07-21 DIAGNOSIS — R1010 Upper abdominal pain, unspecified: Secondary | ICD-10-CM

## 2019-07-21 NOTE — Progress Notes (Signed)
Pt seen at [facility]

## 2019-09-21 ENCOUNTER — Ambulatory Visit: Payer: Self-pay | Admitting: Neurology

## 2019-09-27 ENCOUNTER — Encounter: Payer: Self-pay | Admitting: Neurology

## 2019-09-27 ENCOUNTER — Other Ambulatory Visit: Payer: Self-pay

## 2019-09-27 ENCOUNTER — Other Ambulatory Visit: Payer: Self-pay | Admitting: Neurology

## 2019-09-27 ENCOUNTER — Ambulatory Visit (INDEPENDENT_AMBULATORY_CARE_PROVIDER_SITE_OTHER): Payer: Medicare Other | Admitting: Neurology

## 2019-09-27 VITALS — BP 138/90 | HR 89 | Temp 97.7°F | Ht 71.0 in | Wt 241.0 lb

## 2019-09-27 DIAGNOSIS — G609 Hereditary and idiopathic neuropathy, unspecified: Secondary | ICD-10-CM

## 2019-09-27 MED ORDER — ESOMEPRAZOLE MAGNESIUM 40 MG PO CPDR: 40.0000 mg | DELAYED_RELEASE_CAPSULE | Freq: Every day | ORAL | 1 refills | Status: DC

## 2019-09-27 NOTE — Progress Notes (Signed)
Reason for visit: Low back pain, left-sided sciatica, peripheral neuropathy  Adam Holloway is an 49 y.o. male  History of present illness:  Adam Holloway is a 49 year old right-handed white male with a history of some discomfort down the left leg, he has some numbness sensation of the left foot when he puts on shoes or socks.  The patient has been found to have a mild primarily axonal peripheral neuropathy.  MRI of the lumbar spine was relatively unremarkable, no evidence of spinal stenosis or nerve root impingement was seen.  The patient has gotten somewhat better with his sciatica type pain, he no longer requires oxycodone regularly.  The patient denies any severe balance issues.  The blood work did show a minimally elevated hemoglobin A1c of 5.7, he has tried to get on a low carbohydrate diet.  He has recently had some troubles with reflux, he has a history of Barrett's esophagus, Nexium in the past has worked well, he has been unable to tolerate Prilosec.  The patient returns to this office for an evaluation.  Past Medical History:  Diagnosis Date  . Arthritis    back, left wrist, left knee  . Complication of anesthesia    bp dropped during gallbladder surgery in 2008  . Depression   . Family history of adverse reaction to anesthesia    mom stopped breathing during colonscopy  . GERD (gastroesophageal reflux disease)    h/o  . HA (headache)    h/o migrianes  . Herniated disc, cervical   . History of kidney stones    h/o stones  . Lumbago   . Pain in soft tissues of limb   . Peripheral neuropathy 04/12/2019  . PTSD (post-traumatic stress disorder)   . Scoliosis     Past Surgical History:  Procedure Laterality Date  . CHOLECYSTECTOMY  2008  . COLONOSCOPY  2010  . COLONOSCOPY WITH PROPOFOL N/A 06/09/2016   Procedure: COLONOSCOPY WITH PROPOFOL;  Surgeon: Christene Lye, MD;  Location: [facility];  Service: Endoscopy;  Laterality: N/A;  . FISSURECTOMY N/A  06/29/2016   Procedure: FISSURECTOMY;  Surgeon: Christene Lye, MD;  Location: [facility];  Service: General;  Laterality: N/A;  . HEMORRHOID SURGERY N/A 06/29/2016   Procedure: HEMORRHOIDECTOMY;  Surgeon: Christene Lye, MD;  Location: [facility];  Service: General;  Laterality: N/A;  . PAROTIDECTOMY Left 1997  . SALIVARY GLAND SURGERY  1997  . SPHINCTEROTOMY N/A 06/29/2016   Procedure: SPHINCTEROTOMY;  Surgeon: Christene Lye, MD;  Location: [facility];  Service: General;  Laterality: N/A;  . WRIST ARTHROSCOPY Left 01/26/2017   Procedure: ARTHROSCOPY LEFT WRIST DEBRIDEMENT;  Surgeon: Daryll Brod, MD;  Location: [place] [facility];  Service: Orthopedics;  Laterality: Left;  . WRIST SURGERY  2019    Family History  Problem Relation Age of Onset  . Cancer Father   . Heart Problems Father   . Lung cancer Father   . Colon cancer Mother   . Liver cancer Brother     Social history:  reports that he quit smoking about 8 months ago. His smoking use included cigarettes. He has a 16.00 pack-year smoking history. He has quit using smokeless tobacco. He reports that he does not drink alcohol or use drugs.    Allergies  Allergen Reactions  . Augmentin [Amoxicillin-Pot Clavulanate] Anaphylaxis  . Hydrocodone-Acetaminophen Shortness Of Breath  . Nsaids Other (See Comments)    Chest pain  . Tolmetin Other (See Comments)  Chest pain  . Meloxicam Rash    Medications:  Prior to Admission medications   Medication Sig Start Date End Date Taking? Authorizing Provider  fluticasone furoate-vilanterol (BREO ELLIPTA) 100-25 MCG/INH AEPB Inhale 1 puff into the lungs daily.   Yes [provider]  methocarbamol (ROBAXIN) 500 MG tablet Take 1 tablet (500 mg total) by mouth 3 (three) times daily. 05/15/19  Yes Kathrynn Ducking, MD  Multiple Vitamin (MULTIVITAMIN) capsule Take 1 capsule by mouth daily.   Yes [provider]  oxyCODONE (OXY IR/ROXICODONE) 5 MG  immediate release tablet Take 5 mg by mouth every 4 (four) hours as needed. 08/31/19  Yes [provider]    ROS:  Out of a complete 14 system review of symptoms, the patient complains only of the following symptoms, and all other reviewed systems are negative.  Left foot numbness Sciatica, left  Blood pressure 138/90, pulse 89, temperature 97.7 F (36.5 C), height 5\' 11"  (1.803 m), weight 241 lb (109.3 kg).  Physical Exam  General: The patient is alert and cooperative at the time of the examination.  The patient is minimally obese.  Neuromuscular: Range move the low back is full.  The patient is able to walk on the toes and heels bilaterally.  Skin: No significant peripheral edema is noted.   Neurologic Exam  Mental status: The patient is alert and oriented x 3 at the time of the examination. The patient has apparent normal recent and remote memory, with an apparently normal attention span and concentration ability.   Cranial nerves: Facial symmetry is present. Speech is normal, no aphasia or dysarthria is noted. Extraocular movements are full. Visual fields are full.  Motor: The patient has good strength in all 4 extremities.  Sensory examination: Soft touch sensation is symmetric on the face and arms bilaterally, there is slight decrease in soft touch sensation on the left lower leg as compared to the right.  Coordination: The patient has good finger-nose-finger and heel-to-shin bilaterally.  Gait and station: The patient has a normal gait. Tandem gait is normal. Romberg is negative. No drift is seen.  Reflexes: Deep tendon reflexes are symmetric.   MRI lumbar 05/19/19:  IMPRESSION: This MRI of the lumbar spine without contrast shows degenerative changes as detailed above. The most significant findings are:: 1. At L4-L5, there is mild spinal stenosis due to facet hypertrophy and mild disc bulging. There is mild lateral recess stenosis but no nerve root  compression. 2. At L5-S1, there is a small central disc bulge. There is mild lateral recess stenosis but no spinal stenosis or nerve root compression.  * MRI scan images were reviewed online. I agree with the written report.    Assessment/Plan:  1.  Mild peripheral neuropathy  2.  Left-sided sciatica, etiology unclear  3.  Mild prediabetes, hemoglobin A1c of 5.7  The patient currently is doing some better with his overall pain.  A prescription for Nexium was sent in for his reflux problems.  He will be followed over time for the above issues, if the sciatica worsens, we may consider a left SI joint injection.  The patient will follow-up here in 6 months.  Greater than 50% of the visit was spent in counseling and coordination of care.  Face-to-face time with the patient was 20 minutes.   Jill Alexanders MD 09/27/2019 12:12 PM  [facility] [address] [place], [place] [address]  Phone [phone] Fax [phone]

## 2019-09-28 ENCOUNTER — Other Ambulatory Visit: Payer: Self-pay | Admitting: Neurology

## 2019-09-28 ENCOUNTER — Telehealth: Payer: Self-pay | Admitting: *Deleted

## 2019-09-28 MED ORDER — OMEPRAZOLE 20 MG PO CPDR: 20.0000 mg | DELAYED_RELEASE_CAPSULE | Freq: Every day | ORAL | 1 refills | Status: DC

## 2019-09-28 NOTE — Telephone Encounter (Signed)
Pt has coverage through [employer] (medicare plan - [phone]).  [id number].    His plan will not cover Nexium without him trying the other formulary alternatives.  He has failed omeprazole.  The other covered options are pantroprazole, lansoprazole and Dexilant.

## 2019-09-28 NOTE — Telephone Encounter (Signed)
I called the patient, his insurance will not cover the Nexium, if he desires to go on Protonix, I will try this medication.  He will call back to let me know.

## 2019-12-13 ENCOUNTER — Ambulatory Visit (INDEPENDENT_AMBULATORY_CARE_PROVIDER_SITE_OTHER): Payer: Medicare Other

## 2019-12-13 ENCOUNTER — Encounter: Payer: Self-pay | Admitting: Orthopaedic Surgery

## 2019-12-13 ENCOUNTER — Other Ambulatory Visit: Payer: Self-pay

## 2019-12-13 ENCOUNTER — Ambulatory Visit (INDEPENDENT_AMBULATORY_CARE_PROVIDER_SITE_OTHER): Payer: Medicare Other | Admitting: Orthopaedic Surgery

## 2019-12-13 VITALS — Ht 71.0 in | Wt 232.0 lb

## 2019-12-13 DIAGNOSIS — M25532 Pain in left wrist: Secondary | ICD-10-CM

## 2019-12-13 DIAGNOSIS — M25522 Pain in left elbow: Secondary | ICD-10-CM

## 2019-12-13 NOTE — Progress Notes (Signed)
Office Visit Note   Patient: Adam Holloway           Date of Birth: 15-Jan-1971           MRN: [id number] Visit Date: 12/13/2019              Requested by: [name], MD [address] [place],  [place] [address] PCP: [name], MD   Assessment & Plan: Visit Diagnoses:  1. Pain in left elbow   2. Pain in left wrist     Plan: Left elbow pain appears to be irritation of the ulnar nerve in its groove.  There is a positive Tinel's.  No history of subluxation.  Neurologically intact.  Discussed avoidance of pressure on the elbow and using Voltaren gel.  Consider local cortisone injection or exploration and decompression of the nerve as outlined in detail. Wrist pain is a combination of factors.  He has been seen by Dr. Fredna Dow and had an arthroscopic debridement of the wrist joint.  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  Would refer him to Dr. Fredna Dow for any persistent problems.  Follow-Up Instructions: Return if symptoms worsen or fail to improve.   Orders:  Orders Placed This Encounter  Procedures  . XR Elbow 2 Views Left  . XR Wrist Complete Left   No orders of the defined types were placed in this encounter.     Procedures: No procedures performed   Clinical Data: No additional findings.   Subjective: Chief Complaint  Patient presents with  . Left Elbow - Pain  Patient presents today for left elbow pain that started over a month ago. No known injury. It started hurting on and off, and now hurts constantly. The pain is located more medial and radiates around. No swelling. He has had some numbness and tingling in his fingers. He is right hand dominant. No previous surgery to his elbow. The pain seems to maybe worsen if he lifts anything with that arm.  He also presents today for left wrist pain. He has been having pain on the posterior side of his wrist. He had surgery four years ago with Dr.Kuzma to remove  metal in his wrist. He was told that he had a bone in his wrist that was "dying". His pain is almost constant.  Does use over-the-counter medicines which seem to help but is limited in his ability to take NSAIDs as with a history of pancreatitis and just uses Tylenol.  HPI  Review of Systems   Objective: Vital Signs: Ht 5\' 11"  (1.803 m)   Wt 232 lb (105.2 kg)   BMI 32.36 kg/m   Physical Exam Constitutional:      Appearance: He is well-developed.  Eyes:     Pupils: Pupils are equal, round, and reactive to light.  Pulmonary:     Effort: Pulmonary effort is normal.  Skin:    General: Skin is warm and dry.  Neurological:     Mental Status: He is alert and oriented to person, place, and time.  Psychiatric:        Behavior: Behavior normal.     Ortho Exam full range of motion of left elbow.  Local tenderness over the ulnar nerve with positive Tinel's referred to the ulnar 2 digits.  Abduction and adduction normal.  Good sensibility. Left wrist with some tenderness at the radiocarpal joint dorsally.  Skin intact.  No swelling.  Neurologically intact.  Does have some mild pain with dorsiflexion and volar flexion of the wrist but did not appear to have any loss of motion.  Full pronation and supination. Specialty Comments:  No specialty comments available.  Imaging: XR Elbow 2 Views Left  Result Date: 12/13/2019 Films of the left elbow obtained in 2 projections.  No effusion or evidence of hemarthrosis.  No obvious degenerative changes.  No ectopic calcification or acute changes i.e. no fractures  XR Wrist Complete Left  Result Date: 12/13/2019 Films of the left wrist were obtained in several projections.  There appeared to be some degenerative changes at the radiocarpal joint with slight narrowing of the joint and some irregularity of the distal radius joint surface.  There may be some widening between the navicular and lunate.  Faint increased density within the lunate which by  history was a foreign body.  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 Pain in left wrist 12/13/2019  . Pain in left elbow 12/13/2019  . Peripheral neuropathy 04/12/2019  . Low vitamin B12 level 02/20/2019  . Leukocytosis 02/12/2019  . Right-sided chest pain 02/12/2019  . Neck pain 12/07/2018  . HA (headache)   . Pain in soft tissues of limb   . Lumbago   . Gynecomastia, male 07/12/2013   Past Medical History:  Diagnosis Date  . Arthritis    back, left wrist, left knee  . Complication of anesthesia    bp dropped during gallbladder surgery in 2008  . Depression   . Family history of adverse reaction to anesthesia    mom stopped breathing during colonscopy  . GERD (gastroesophageal reflux disease)    h/o  . HA (headache)    h/o migrianes  . Herniated disc, cervical   . History of kidney stones    h/o stones  . Lumbago   . Pain in soft tissues of limb   . Peripheral neuropathy 04/12/2019  . PTSD (post-traumatic stress disorder)   . Scoliosis     Family History  Problem Relation Age of Onset  . Cancer Father   . Heart Problems Father   . Lung cancer Father   . Colon cancer Mother   . Liver cancer Brother     Past Surgical History:  Procedure Laterality Date  . CHOLECYSTECTOMY  2008  . COLONOSCOPY  2010  . COLONOSCOPY WITH PROPOFOL N/A 06/09/2016   Procedure: COLONOSCOPY WITH PROPOFOL;  Surgeon: Christene Lye, MD;  Location: [facility];  Service: Endoscopy;  Laterality: N/A;  . FISSURECTOMY N/A 06/29/2016   Procedure: FISSURECTOMY;  Surgeon: Christene Lye, MD;  Location: [facility];  Service: General;  Laterality: N/A;  . HEMORRHOID SURGERY N/A 06/29/2016   Procedure: HEMORRHOIDECTOMY;  Surgeon: Christene Lye, MD;  Location: [facility];  Service: General;  Laterality: N/A;  . PAROTIDECTOMY Left 1997  . SALIVARY GLAND SURGERY  1997    . SPHINCTEROTOMY N/A 06/29/2016   Procedure: SPHINCTEROTOMY;  Surgeon: Christene Lye, MD;  Location: [facility];  Service: General;  Laterality: N/A;  . WRIST ARTHROSCOPY Left 01/26/2017   Procedure: ARTHROSCOPY LEFT WRIST DEBRIDEMENT;  Surgeon: Daryll Brod, MD;  Location: [facility];  Service: Orthopedics;  Laterality: Left;  . WRIST SURGERY  2019   Social History   Occupational History    Comment: Disabled  Tobacco Use  . Smoking status: Former Smoker  Packs/day: 0.50    Years: 32.00    Pack years: 16.00    Types: Cigarettes    Quit date: 01/16/2019    Years since quitting: 0.9  . Smokeless tobacco: Former Systems developer  . Tobacco comment: Quit 2020  Substance and Sexual Activity  . Alcohol use: No    Alcohol/week: 0.0 standard drinks  . Drug use: No  . Sexual activity: Not on file

## 2020-01-30 ENCOUNTER — Encounter: Payer: Self-pay | Admitting: Internal Medicine

## 2020-01-30 ENCOUNTER — Other Ambulatory Visit: Payer: Self-pay

## 2020-01-30 ENCOUNTER — Ambulatory Visit (INDEPENDENT_AMBULATORY_CARE_PROVIDER_SITE_OTHER): Payer: Medicare Other | Admitting: Internal Medicine

## 2020-01-30 VITALS — BP 122/87 | HR 77 | Wt 241.5 lb

## 2020-01-30 DIAGNOSIS — M5442 Lumbago with sciatica, left side: Secondary | ICD-10-CM | POA: Diagnosis not present

## 2020-01-30 DIAGNOSIS — G8929 Other chronic pain: Secondary | ICD-10-CM

## 2020-01-30 DIAGNOSIS — M25532 Pain in left wrist: Secondary | ICD-10-CM

## 2020-01-30 NOTE — Progress Notes (Addendum)
Established Patient Office Visit  Subjective:  Patient ID: Adam Holloway, male    DOB: 12-15-1970  Age: 49 y.o. MRN: [id number]  CC:  Chief Complaint  Patient presents with  . Back Pain    Chronic, pt is requesting a referral for pain management  . Wrist Pain    Chronic, pt is requesting a referral for pain management    HPI  Adam Holloway presents for patient complaining of back pain in the lumbar region is intermittent and sometimes goes to the back he has been seeing somebody in the pain management.  But he received a letter that they are closing the doors.  He is looking for a different pain specialist and he will call us for the referral.  He also has a wrist pain which is stable at the present time he cannot take any anti-inflammatories because of the stomach problems.  Past Medical History:  Diagnosis Date  . Arthritis    back, left wrist, left knee  . Complication of anesthesia    bp dropped during gallbladder surgery in 2008  . Depression   . Family history of adverse reaction to anesthesia    mom stopped breathing during colonscopy  . GERD (gastroesophageal reflux disease)    h/o  . HA (headache)    h/o migrianes  . Herniated disc, cervical   . History of kidney stones    h/o stones  . Lumbago   . Pain in soft tissues of limb   . Peripheral neuropathy 04/12/2019  . PTSD (post-traumatic stress disorder)   . Scoliosis     Past Surgical History:  Procedure Laterality Date  . CHOLECYSTECTOMY  2008  . COLONOSCOPY  2010  . COLONOSCOPY WITH PROPOFOL N/A 06/09/2016   Procedure: COLONOSCOPY WITH PROPOFOL;  Surgeon: Christene Lye, MD;  Location: [facility];  Service: Endoscopy;  Laterality: N/A;  . FISSURECTOMY N/A 06/29/2016   Procedure: FISSURECTOMY;  Surgeon: Christene Lye, MD;  Location: [facility];  Service: General;  Laterality: N/A;  . HEMORRHOID SURGERY N/A 06/29/2016   Procedure: HEMORRHOIDECTOMY;  Surgeon: Christene Lye, MD;   Location: [facility];  Service: General;  Laterality: N/A;  . PAROTIDECTOMY Left 1997  . SALIVARY GLAND SURGERY  1997  . SPHINCTEROTOMY N/A 06/29/2016   Procedure: SPHINCTEROTOMY;  Surgeon: Christene Lye, MD;  Location: [facility];  Service: General;  Laterality: N/A;  . WRIST ARTHROSCOPY Left 01/26/2017   Procedure: ARTHROSCOPY LEFT WRIST DEBRIDEMENT;  Surgeon: Daryll Brod, MD;  Location: [place];  Service: Orthopedics;  Laterality: Left;  . WRIST SURGERY  2019    Family History  Problem Relation Age of Onset  . Cancer Father   . Heart Problems Father   . Lung cancer Father   . Colon cancer Mother   . Liver cancer Brother     Social History   Socioeconomic History  . Marital status: Single    Spouse name: Not on file  . Number of children: 1  . Years of education: GED  . Highest education level: Not on file  Occupational History    Comment: Disabled  Tobacco Use  . Smoking status: Former Smoker    Packs/day: 0.50    Years: 32.00    Pack years: 16.00    Types: Cigarettes    Quit date: 01/16/2019    Years since quitting: 1.0  . Smokeless tobacco: Former Systems developer  . Tobacco comment: Quit 2020  Substance and Sexual Activity  .  Alcohol use: No    Alcohol/week: 0.0 standard drinks  . Drug use: No  . Sexual activity: Not on file  Other Topics Concern  . Not on file  Social History Narrative   Patient lives at home with his mother.   Disabled.   Education GED.   Right handed.   Caffeine two cups of coffee daily.   Social Determinants of Health   Financial Resource Strain:   . Difficulty of Paying Living Expenses:   Food Insecurity:   . Worried About Charity fundraiser in the Last Year:   . Arboriculturist in the Last Year:   Transportation Needs:   . Film/video editor (Medical):   Marland Kitchen Lack of Transportation (Non-Medical):   Physical Activity:   . Days of Exercise per Week:   . Minutes of Exercise per Session:   Stress:   . Feeling of  Stress :   Social Connections:   . Frequency of Communication with Friends and Family:   . Frequency of Social Gatherings with Friends and Family:   . Attends Religious Services:   . Active Member of Clubs or Organizations:   . Attends Archivist Meetings:   Marland Kitchen Marital Status:   Intimate Partner Violence:   . Fear of Current or Ex-Partner:   . Emotionally Abused:   Marland Kitchen Physically Abused:   . Sexually Abused:      Current Outpatient Medications:  .  fluticasone furoate-vilanterol (BREO ELLIPTA) 100-25 MCG/INH AEPB, Inhale 1 puff into the lungs daily., Disp: , Rfl:  .  Multiple Vitamin (MULTIVITAMIN) capsule, Take 1 capsule by mouth daily., Disp: , Rfl:  .  omeprazole (PRILOSEC) 20 MG capsule, Take 1 capsule (20 mg total) by mouth daily., Disp: 90 capsule, Rfl: 1   Allergies  Allergen Reactions  . Augmentin [Amoxicillin-Pot Clavulanate] Anaphylaxis  . Hydrocodone-Acetaminophen Shortness Of Breath    Pt states he is not allergic to Hydrocodone and would like it to be removed from his chart.  . Nsaids Other (See Comments)    Chest pain  . Tolmetin Other (See Comments)    Chest pain  . Meloxicam Other (See Comments)    Chest pain    ROS Review of Systems  Constitutional: Negative for appetite change and chills.  HENT: Negative for congestion, hearing loss, nosebleeds and postnasal drip.   Eyes: Negative for pain.  Respiratory: Negative for choking.   Cardiovascular: Negative for chest pain.  Gastrointestinal: Positive for abdominal pain.       Patient has an endoscopy and colonoscopy done.  Is found to have some chronic gastritis H. pylori was negative.:  Exam reveals a multiple polyps which were removed none of them were malignant.  He has been advised to repeat the colonoscopy and 3 years  Genitourinary: Negative for dysuria.  Musculoskeletal: Positive for back pain.      Objective:    Physical Exam  Constitutional: He is oriented to person, place, and time. He  appears well-developed.  HENT:  Head: Normocephalic.  Eyes: Pupils are equal, round, and reactive to light.  Neck: No JVD present. No tracheal deviation present. No thyromegaly present.  Cardiovascular: Normal rate and regular rhythm.  Pulmonary/Chest: No stridor. No respiratory distress. He has no wheezes.  Abdominal: Soft. There is abdominal tenderness (rt lower quad).  Musculoskeletal:        General: Tenderness (lumber region) present.     Cervical back: Normal range of motion and neck supple.  Lymphadenopathy:  He has no cervical adenopathy.  Neurological: He is alert and oriented to person, place, and time.  Psychiatric: He has a normal mood and affect.    BP 122/87 (BP Location: Right Arm, Patient Position: Sitting, Cuff Size: Large)   Pulse 77   Wt 241 lb 8 oz (109.5 kg)   BMI 33.68 kg/m  Wt Readings from Last 3 Encounters:  01/30/20 241 lb 8 oz (109.5 kg)  12/13/19 232 lb (105.2 kg)  09/27/19 241 lb (109.3 kg)     Health Maintenance Due  Topic Date Due  . COVID-19 Vaccine (1) Never done  . TETANUS/TDAP  Never done    There are no preventive care reminders to display for this patient.  Lab Results  Component Value Date   TSH 1.670 04/12/2019   Lab Results  Component Value Date   WBC 10.7 (H) 02/08/2019   HGB 15.5 02/08/2019   HCT 46.3 02/08/2019   MCV 92.6 02/08/2019   PLT 195 02/08/2019   Lab Results  Component Value Date   NA 139 01/19/2019   K 4.5 01/19/2019   CO2 23 01/19/2019   GLUCOSE 90 01/19/2019   BUN 8 01/19/2019   CREATININE 0.82 01/19/2019   BILITOT 0.3 01/19/2019   ALKPHOS 76 01/19/2019   AST 27 01/19/2019   ALT 48 (H) 01/19/2019   PROT 7.1 04/12/2019   ALBUMIN 4.5 01/19/2019   CALCIUM 9.9 01/19/2019   ANIONGAP 10 12/24/2018   No results found for: CHOL No results found for: HDL No results found for: LDLCALC No results found for: TRIG No results found for: CHOLHDL Lab Results  Component Value Date   HGBA1C 5.7 (H)  04/12/2019      Assessment & Plan:   Problem List Items Addressed This Visit      Other   Pain in left wrist (Chronic)   Lumbago - Primary      No orders of the defined types were placed in this encounter.  1. Chronic left-sided low back pain with left-sided sciatica   Ref To the pain specialist    patient is an off-and-on smoker he is trying to quit for the present time..  Patient was explained about the hazards of smoking and even vaping.  2. Pain in left wrist Ref to the pain and orthopedic. Follow-up: Return in about 3 months (around 05/01/2020).    [name], MD

## 2020-02-06 ENCOUNTER — Telehealth: Payer: Self-pay

## 2020-02-06 NOTE — Telephone Encounter (Signed)
LEft vm for patient that Dr. Jannifer Franklin had a 1000am cancellation today.Left vm for pt to call back if he is able to make appt.

## 2020-02-06 NOTE — Telephone Encounter (Signed)
Pt returned call and stated that unfortunately he will not be able to come to the 10 am appt. Pt verbalized appreciation.

## 2020-03-16 ENCOUNTER — Other Ambulatory Visit: Payer: Self-pay | Admitting: Neurology

## 2020-04-04 ENCOUNTER — Ambulatory Visit: Payer: Medicare Other | Admitting: Neurology

## 2020-04-11 ENCOUNTER — Other Ambulatory Visit: Payer: Self-pay

## 2020-04-11 ENCOUNTER — Ambulatory Visit (INDEPENDENT_AMBULATORY_CARE_PROVIDER_SITE_OTHER): Payer: Medicare Other | Admitting: Internal Medicine

## 2020-04-11 ENCOUNTER — Encounter: Payer: Self-pay | Admitting: Internal Medicine

## 2020-04-11 VITALS — BP 132/89 | HR 90 | Ht 72.0 in | Wt 236.7 lb

## 2020-04-11 DIAGNOSIS — M5442 Lumbago with sciatica, left side: Secondary | ICD-10-CM

## 2020-04-11 DIAGNOSIS — E559 Vitamin D deficiency, unspecified: Secondary | ICD-10-CM

## 2020-04-11 DIAGNOSIS — K219 Gastro-esophageal reflux disease without esophagitis: Secondary | ICD-10-CM | POA: Diagnosis not present

## 2020-04-11 DIAGNOSIS — G629 Polyneuropathy, unspecified: Secondary | ICD-10-CM | POA: Diagnosis not present

## 2020-04-11 DIAGNOSIS — F419 Anxiety disorder, unspecified: Secondary | ICD-10-CM

## 2020-04-11 DIAGNOSIS — G8929 Other chronic pain: Secondary | ICD-10-CM

## 2020-04-11 MED ORDER — DIAZEPAM 5 MG PO TABS: 5.0000 mg | ORAL_TABLET | Freq: Every day | ORAL | 0 refills | Status: DC

## 2020-04-11 NOTE — Assessment & Plan Note (Signed)
-   The patient's GERD is stable on medication.  - Instructed the patient to avoid eating spicy and acidic foods, as well as foods high in fat. - Instructed the patient to avoid eating large meals or meals 2-3 hours prior to sleeping. 

## 2020-04-11 NOTE — Progress Notes (Signed)
Established Patient Office Visit  SUBJECTIVE:  Subjective  Patient ID: Adam Holloway, male    DOB: [birth year]  Age: 49 y.o. MRN: [id number]  CC:  Chief Complaint  Patient presents with  . Anxiety    patient needs refill of medciation     HPI Adam Holloway is a 49 y.o. male presenting today for anxiety and sleeping medication. Pt is on a diet. He reports he got dehydrated on his diet. Pt choose not to go through pain clinic. His Vitamin D is low on his last blood test. Pt has been prescribed Vitamin D supplement once a week. He is on the Keto diet. Pt has lost weight. Pt has been slightly fatigued. He sometimes has acid reflux but takes Prilosec. Pt has anxiety and he is unable to sleep. He has back and wrist pain and is taking pain medication, Vicodin,  for it but wants to get off of it. Pt is going to taper off of the pain medication.  His neuropathy has not been great. He has burning feet. Pt has not been taking Vitamin B12 supplements.   Past Medical History:  Diagnosis Date  . Arthritis    back, left wrist, left knee  . Complication of anesthesia    bp dropped during gallbladder surgery in 2008  . Depression   . Family history of adverse reaction to anesthesia    mom stopped breathing during colonscopy  . GERD (gastroesophageal reflux disease)    h/o  . HA (headache)    h/o migrianes  . Herniated disc, cervical   . History of kidney stones    h/o stones  . Lumbago   . Pain in soft tissues of limb   . Peripheral neuropathy 04/12/2019  . PTSD (post-traumatic stress disorder)   . Scoliosis     Past Surgical History:  Procedure Laterality Date  . CHOLECYSTECTOMY  2008  . COLONOSCOPY  2010  . COLONOSCOPY WITH PROPOFOL N/A 06/09/2016   Procedure: COLONOSCOPY WITH PROPOFOL;  Surgeon: Christene Lye, MD;  Location: [facility];  Service: Endoscopy;  Laterality: N/A;  . FISSURECTOMY N/A 06/29/2016   Procedure: FISSURECTOMY;  Surgeon: Christene Lye,  MD;  Location: [facility];  Service: General;  Laterality: N/A;  . HEMORRHOID SURGERY N/A 06/29/2016   Procedure: HEMORRHOIDECTOMY;  Surgeon: Christene Lye, MD;  Location: [facility];  Service: General;  Laterality: N/A;  . PAROTIDECTOMY Left 1997  . SALIVARY GLAND SURGERY  1997  . SPHINCTEROTOMY N/A 06/29/2016   Procedure: SPHINCTEROTOMY;  Surgeon: Christene Lye, MD;  Location: [facility];  Service: General;  Laterality: N/A;  . WRIST ARTHROSCOPY Left 01/26/2017   Procedure: ARTHROSCOPY LEFT WRIST DEBRIDEMENT;  Surgeon: Daryll Brod, MD;  Location: [place];  Service: Orthopedics;  Laterality: Left;  . WRIST SURGERY  2019    Family History  Problem Relation Age of Onset  . Cancer Father   . Heart Problems Father   . Lung cancer Father   . Colon cancer Mother   . Liver cancer Brother     Social History   Socioeconomic History  . Marital status: Single    Spouse name: Not on file  . Number of children: 1  . Years of education: GED  . Highest education level: Not on file  Occupational History    Comment: Disabled  Tobacco Use  . Smoking status: Former Smoker    Packs/day: 0.50    Years: 32.00    Pack years: 16.00  Types: Cigarettes    Quit date: 01/16/2019    Years since quitting: 1.2  . Smokeless tobacco: Former Systems developer  . Tobacco comment: Quit 2020  Vaping Use  . Vaping Use: Never used  Substance and Sexual Activity  . Alcohol use: No    Alcohol/week: 0.0 standard drinks  . Drug use: No  . Sexual activity: Not on file  Other Topics Concern  . Not on file  Social History Narrative   Patient lives at home with his mother.   Disabled.   Education GED.   Right handed.   Caffeine two cups of coffee daily.   Social Determinants of Health   Financial Resource Strain:   . Difficulty of Paying Living Expenses:   Food Insecurity:   . Worried About Charity fundraiser in the Last Year:   . Arboriculturist in the Last Year:   Transportation  Needs:   . Film/video editor (Medical):   Marland Kitchen Lack of Transportation (Non-Medical):   Physical Activity:   . Days of Exercise per Week:   . Minutes of Exercise per Session:   Stress:   . Feeling of Stress :   Social Connections:   . Frequency of Communication with Friends and Family:   . Frequency of Social Gatherings with Friends and Family:   . Attends Religious Services:   . Active Member of Clubs or Organizations:   . Attends Archivist Meetings:   Marland Kitchen Marital Status:   Intimate Partner Violence:   . Fear of Current or Ex-Partner:   . Emotionally Abused:   Marland Kitchen Physically Abused:   . Sexually Abused:      Current Outpatient Medications:  .  aspirin 81 MG EC tablet, Take 81 mg by mouth daily., Disp: , Rfl:  .  diazepam (VALIUM) 5 MG tablet, Take 1 tablet (5 mg total) by mouth daily., Disp: 30 tablet, Rfl: 0 .  fluticasone furoate-vilanterol (BREO ELLIPTA) 100-25 MCG/INH AEPB, Inhale 1 puff into the lungs daily., Disp: , Rfl:  .  Multiple Vitamin (MULTIVITAMIN) capsule, Take 1 capsule by mouth daily., Disp: , Rfl:  .  omeprazole (PRILOSEC) 20 MG capsule, TAKE 1 CAPSULE BY MOUTH EVERY DAY, Disp: 90 capsule, Rfl: 1   Allergies  Allergen Reactions  . Augmentin [Amoxicillin-Pot Clavulanate] Anaphylaxis  . Hydrocodone-Acetaminophen Shortness Of Breath    Pt states he is not allergic to Hydrocodone and would like it to be removed from his chart.  . Nsaids Other (See Comments)    Chest pain  . Tolmetin Other (See Comments)    Chest pain  . Meloxicam Other (See Comments)    Chest pain    ROS Review of Systems  Constitutional: Positive for fatigue.  HENT: Negative.   Eyes: Negative.   Respiratory: Negative.   Cardiovascular: Negative.   Gastrointestinal: Negative.        Acid Reflux  Endocrine: Negative.   Genitourinary: Negative.   Musculoskeletal: Positive for back pain.       Anxiety, Insomnia, Wrist pain  Skin: Negative.   Allergic/Immunologic: Negative.    Neurological: Negative.        Neuropathy with burning feet  Hematological: Negative.   Psychiatric/Behavioral: Negative.   All other systems reviewed and are negative.    OBJECTIVE:    Physical Exam Vitals reviewed.  Constitutional:      Appearance: Normal appearance.  HENT:     Mouth/Throat:     Mouth: Mucous membranes are moist.  Eyes:  Pupils: Pupils are equal, round, and reactive to light.  Neck:     Vascular: No carotid bruit.  Cardiovascular:     Rate and Rhythm: Normal rate and regular rhythm.     Pulses: Normal pulses.     Heart sounds: Normal heart sounds.  Pulmonary:     Effort: Pulmonary effort is normal.     Breath sounds: Normal breath sounds.  Abdominal:     General: Bowel sounds are normal.     Palpations: Abdomen is soft. There is no hepatomegaly, splenomegaly or mass.     Tenderness: There is no abdominal tenderness.     Hernia: No hernia is present.  Musculoskeletal:     Cervical back: Neck supple.     Right lower leg: No edema.     Left lower leg: No edema.  Skin:    Findings: No rash.  Neurological:     Mental Status: He is alert and oriented to person, place, and time.     Motor: No weakness.  Psychiatric:        Mood and Affect: Mood normal.        Behavior: Behavior normal.     BP (!) 132/89   Pulse 90   Ht 6' (1.829 m)   Wt (!) 236 lb 11.2 oz (107.4 kg)   BMI 32.10 kg/m  Wt Readings from Last 3 Encounters:  04/11/20 (!) 236 lb 11.2 oz (107.4 kg)  01/30/20 241 lb 8 oz (109.5 kg)  12/13/19 232 lb (105.2 kg)    Health Maintenance Due  Topic Date Due  . COVID-19 Vaccine (1) Never done  . TETANUS/TDAP  Never done    There are no preventive care reminders to display for this patient.  CBC Latest Ref Rng & Units 02/08/2019 12/24/2018 12/22/2018  WBC 4.0 - 10.5 K/uL 10.7(H) 11.3(H) 16.7(H)  Hemoglobin 13.0 - 17.0 g/dL 15.5 13.2 13.7  Hematocrit 39 - 52 % 46.3 39.9 40.6  Platelets 150 - 400 K/uL 195 188 207   CMP Latest Ref Rng  & Units 04/12/2019 01/19/2019 12/24/2018  Glucose 65 - 99 mg/dL - 90 92  BUN 6 - 24 mg/dL - 8 10  Creatinine 0.76 - 1.27 mg/dL - 0.82 0.74  Sodium 134 - 144 mmol/L - 139 138  Potassium 3.5 - 5.2 mmol/L - 4.5 4.3  Chloride 96 - 106 mmol/L - 101 104  CO2 20 - 29 mmol/L - 23 24  Calcium 8.7 - 10.2 mg/dL - 9.9 8.9  Total Protein 6.0 - 8.5 g/dL 7.1 6.8 7.5  Total Bilirubin 0.0 - 1.2 mg/dL - 0.3 0.7  Alkaline Phos 39 - 117 IU/L - 76 74  AST 0 - 40 IU/L - 27 49(H)  ALT 0 - 44 IU/L - 48(H) 56(H)    Lab Results  Component Value Date   TSH 1.670 04/12/2019   Lab Results  Component Value Date   ALBUMIN 4.5 01/19/2019   ANIONGAP 10 12/24/2018   No results found for: CHOL, HDL, LDLCALC, CHOLHDL No results found for: TRIG Lab Results  Component Value Date   HGBA1C 5.7 (H) 04/12/2019      ASSESSMENT & PLAN:   Problem List Items Addressed This Visit      Digestive   Gastroesophageal reflux disease without esophagitis    - The patient's GERD is stable on medication.  - Instructed the patient to avoid eating spicy and acidic foods, as well as foods high in fat. - Instructed the patient to avoid  eating large meals or meals 2-3 hours prior to sleeping.         Nervous and Auditory   Neuropathy    Pt has bilaterally lower leg neuropathy. Seems to be under control at the present time. - The patient's diabetic neuropathy has improved. - Instructed the patient to seek help if neuropathy worsens or becomes painful.  - Instructed the patient to get help right away if symptoms of sudden weakness, trouble speaking, or a sudden inability to move a part of your body develops.         Other   Lumbago - Primary    - Patient's back pain is under control with medication.  - Encouraged the patient to stretch or do yoga as able to help with back pain       Relevant Medications   aspirin 81 MG EC tablet   Vitamin D deficiency    Pt's Vitamin D supplementation is 50K Units once a week       Anxiety   Relevant Medications   diazepam (VALIUM) 5 MG tablet      Meds ordered this encounter  Medications  . diazepam (VALIUM) 5 MG tablet    Sig: Take 1 tablet (5 mg total) by mouth daily.    Dispense:  30 tablet    Refill:  0      Follow-up: Return in about 3 months (around 07/12/2020).    Dr. Jane Canary [facility] [address], [place], [place] [address]   By signing my name below, I, Dawayne Cirri, attest that this documentation has been prepared under the direction and in the presence of [name], MD. Electronically Signed: [name], MD 04/11/20, 10:45 AM   I personally performed the services described in this documentation, which was SCRIBED in my presence. The recorded information has been reviewed and considered accurate. It has been edited as necessary during review. [name], MD

## 2020-04-11 NOTE — Assessment & Plan Note (Signed)
Pt's Vitamin D supplementation is 50K Units once a week

## 2020-04-11 NOTE — Assessment & Plan Note (Signed)
Pt has bilaterally lower leg neuropathy. Seems to be under control at the present time. - The patient's diabetic neuropathy has improved. - Instructed the patient to seek help if neuropathy worsens or becomes painful.  - Instructed the patient to get help right away if symptoms of sudden weakness, trouble speaking, or a sudden inability to move a part of your body develops.

## 2020-04-11 NOTE — Patient Instructions (Signed)
-  Will see back in 3 months

## 2020-04-11 NOTE — Assessment & Plan Note (Signed)
-   Patient's back pain is under control with medication.  - Encouraged the patient to stretch or do yoga as able to help with back pain 

## 2020-05-01 ENCOUNTER — Ambulatory Visit: Payer: Medicare Other | Admitting: Internal Medicine

## 2020-05-01 ENCOUNTER — Other Ambulatory Visit: Payer: Self-pay

## 2020-05-13 ENCOUNTER — Other Ambulatory Visit: Payer: Self-pay | Admitting: Internal Medicine

## 2020-05-23 NOTE — Progress Notes (Signed)
[facility]  [address], [address] [place], [place] [address] Phone: [phone]  Fax: [phone]   Clinic Day:  05/27/2020  Referring physician: [name], MD  Chief Complaint: Adam Holloway is a 49 y.o. male with leukocytosis who is seen for reassessment.  HPI: The patient was last seen in the hematology clinic on 02/20/2019 via telemedicine. At that time, he felt better.  He still had right sided chest and sternal pain. Hematocrit was 46.3, hemoglobin 15.5, platelets 195,000, WBC 10,700 (ANC 7,100). CRP was <0.8. BCR-ABL FISH was normal. SPEP was normal.  He was to follow up prn.  Peripheral smear revealed persistent mild leukocytosis. There was mild non specific leukocytosis with mature neutrophils and lymphocytes. There were occasional reactive appearing lymphocytes. There was no shift in immaturity. RBC morphology was unremarkable. Platelets appeared mildly increased in size. Overall the morphologic findings were not specific and were likely reactive to another process. A hematologic neoplastic process was not favored.   Colonoscopy and upper endoscopy on 01/26/2020 revealed an irregular Z-line, 40 cm from the incisors. There were no findings of Barrett's over 1 cm. There was a 2 cm hiatal hernia. Pathology revealed 2 adenomatous polyps in the colon and antral-type mucosa with mild foveolar hyperplasia in the stomach. There was no H. pylori, dysplasia, and metaplasia.  He was seen in the [facility] ER on 05/19/2020 for fatigue, which he felt was due to his oxycodone. He was to follow up with Dr. Renaee Munda from pain management to discus possible medication regimen adjustment. WBC was 12,900.  He was then seen in the [facility] ER on 05/21/2020 for generalized weakness and chest pain. Autoimmune and hematologic work-up was recommended. COVID-19 test was negative. WBC was 13,800.  He was seen in the [facility] ER on 05/24/2020 with abdominal pain.  Abdomen and pelvis CT on  05/24/2020 revealed no acute abnormality. WBC was 12,600.  CBC has been followed: 01/08/2020:  Hematocrit 42.7, hemoglobin 14.0, platelets 170,000, WBC 11,700. 05/24/2020:  Hematocrit 43.6, hemoglobin 14.5, platelets 154,000, WBC 12,600 ([place] 9000).  During the interim, he has been "dragging." He states that he "could collapse right here" and "feels like he should be in the hospital."  COVID-19 test was negative. He reports extreme fatigue and shortness of breath. These symptoms are worse when he takes his pain medicine for his back. He also has right hip, leg, and back pain.  He denies a depressed mood, fevers, nausea, vomiting, diarrhea, headaches, vision changes, runny nose, sore throat, snoring, and cough. He also reports swelling in his right neck.  He has been on a diet since 02/26/2020, but notes that he didn't change his diet much. He still eats sugar and carbs.  The patient smokes 1-2 cigarettes daily.   Past Medical History:  Diagnosis Date  . Arthritis    back, left wrist, left knee  . Complication of anesthesia    bp dropped during gallbladder surgery in 2008  . Depression   . Family history of adverse reaction to anesthesia    mom stopped breathing during colonscopy  . GERD (gastroesophageal reflux disease)    h/o  . HA (headache)    h/o migrianes  . Herniated disc, cervical   . History of kidney stones    h/o stones  . Lumbago   . Pain in soft tissues of limb   . Peripheral neuropathy 04/12/2019  . PTSD (post-traumatic stress disorder)   . Scoliosis     Past Surgical History:  Procedure  Laterality Date  . CHOLECYSTECTOMY  2008  . COLONOSCOPY  2010  . COLONOSCOPY WITH PROPOFOL N/A 06/09/2016   Procedure: COLONOSCOPY WITH PROPOFOL;  Surgeon: Christene Lye, MD;  Location: [facility] END[facility]COPY;  Service: Endoscopy;  Laterality: N/A;  . FISSURECTOMY N/A 06/29/2016   Procedure: FISSURECTOMY;  Surgeon: Christene Lye, MD;  Location: [facility] ORS;  Service: General;   Laterality: N/A;  . HEMORRHOID SURGERY N/A 06/29/2016   Procedure: HEMORRHOIDECTOMY;  Surgeon: Christene Lye, MD;  Location: [facility] ORS;  Service: General;  Laterality: N/A;  . PAROTIDECTOMY Left 1997  . SALIVARY GLAND SURGERY  1997  . SPHINCTEROTOMY N/A 06/29/2016   Procedure: SPHINCTEROTOMY;  Surgeon: Christene Lye, MD;  Location: [facility] ORS;  Service: General;  Laterality: N/A;  . WRIST ARTHROSCOPY Left 01/26/2017   Procedure: ARTHROSCOPY LEFT WRIST DEBRIDEMENT;  Surgeon: Daryll Brod, MD;  Location: [place];  Service: Orthopedics;  Laterality: Left;  . WRIST SURGERY  2019    Family History  Problem Relation Age of Onset  . Cancer Father   . Heart Problems Father   . Lung cancer Father   . Colon cancer Mother   . Liver cancer Brother     Social History:  reports that he quit smoking about 16 months ago. His smoking use included cigarettes. He has a 16.00 pack-year smoking history. He has quit using smokeless tobacco. He reports that he does not drink alcohol and does not use drugs. He does not drink alcohol, and stopped in 2006. He smoked a pack a day for 30 years until 01/16/2019. He is currently smoking 1-2 cigarettes daily. He denies any known exposure to radiation or toxins. He does not currently work, but previously worked in Psychologist, educational and for a Engineer, civil (consulting). He is on disability for his back issues and PTSD. The patient is alone today.  Allergies:  Allergies  Allergen Reactions  . Augmentin [Amoxicillin-Pot Clavulanate] Anaphylaxis  . Hydrocodone-Acetaminophen Shortness Of Breath    Pt states he is not allergic to Hydrocodone and would like it to be removed from his chart.  . Nsaids Other (See Comments)    Chest pain  . Tolmetin Other (See Comments)    Chest pain  . Meloxicam Other (See Comments)    Chest pain    Current Medications: Current Outpatient Medications  Medication Sig Dispense Refill  . aspirin 81 MG EC tablet Take 81  mg by mouth daily.    . fluticasone furoate-vilanterol (BREO ELLIPTA) 100-25 MCG/INH AEPB Inhale 1 puff into the lungs daily.    . Multiple Vitamin (MULTIVITAMIN) capsule Take 1 capsule by mouth daily.    Marland Kitchen omeprazole (PRILOSEC) 20 MG capsule TAKE 1 CAPSULE BY MOUTH EVERY DAY 90 capsule 1   No current facility-administered medications for this visit.    Review of Systems  Constitutional: Positive for malaise/fatigue (extreme) and weight loss (17 lbs since 01/30/2020). Negative for chills, diaphoresis and fever.       Feels like "he could collapse right here."  HENT: Negative for congestion, ear discharge, ear pain, hearing loss, nosebleeds, sinus pain, sore throat and tinnitus.   Eyes: Negative for blurred vision.  Respiratory: Positive for shortness of breath. Negative for cough and sputum production.   Cardiovascular: Negative for chest pain, palpitations, orthopnea, leg swelling and PND.  Gastrointestinal: Negative for abdominal pain, blood in stool, constipation, diarrhea, heartburn, nausea and vomiting.       On diet since 02/26/2020, but states that his diet hasn't changed  much.  Genitourinary: Negative for dysuria, frequency, hematuria and urgency.  Musculoskeletal: Positive for back pain and joint pain (right hip and leg). Negative for myalgias and neck pain.       Right neck swelling  Skin: Negative for itching and rash.  Neurological: Positive for weakness. Negative for dizziness, tingling, sensory change and headaches.  Endo/Heme/Allergies: Does not bruise/bleed easily.  Psychiatric/Behavioral: Negative for depression and memory loss. The patient is not nervous/anxious and does not have insomnia.   All other systems reviewed and are negative.   Performance status (ECOG): 2  Physical Exam Vitals and nursing note reviewed.  Constitutional:      General: He is not in acute distress.    Appearance: He is well-developed.  HENT:     Head: Normocephalic and atraumatic.      Comments: Short dark hair.    Mouth/Throat:     Mouth: Mucous membranes are moist.     Pharynx: Oropharynx is clear.  Eyes:     General: No scleral icterus.    Conjunctiva/sclera: Conjunctivae normal.     Pupils: Pupils are equal, round, and reactive to light.     Comments: Blue eyes.  Cardiovascular:     Rate and Rhythm: Normal rate and regular rhythm.     Heart sounds: Normal heart sounds. No murmur heard.   Pulmonary:     Effort: Pulmonary effort is normal. No respiratory distress.     Breath sounds: Normal breath sounds. No wheezing or rales.  Chest:     Chest wall: No tenderness.  Abdominal:     General: Bowel sounds are normal. There is no distension.     Palpations: Abdomen is soft. There is no hepatomegaly, splenomegaly or mass.     Tenderness: There is abdominal tenderness (RUQ with palpation). There is no guarding or rebound.  Musculoskeletal:        General: No swelling or tenderness. Normal range of motion.     Cervical back: Normal range of motion and neck supple.  Lymphadenopathy:     Head:     Right side of head: No preauricular, posterior auricular or occipital adenopathy.     Left side of head: No preauricular, posterior auricular or occipital adenopathy.     Cervical: No cervical adenopathy.     Upper Body:     Right upper body: No supraclavicular or axillary adenopathy.     Left upper body: No supraclavicular or axillary adenopathy.     Lower Body: No right inguinal adenopathy. No left inguinal adenopathy.  Skin:    General: Skin is warm and dry.  Neurological:     Mental Status: He is alert and oriented to person, place, and time.  Psychiatric:        Behavior: Behavior normal.        Thought Content: Thought content normal.        Judgment: Judgment normal.    No visits with results within 3 Day(s) from this visit.  Latest known visit with results is:  Procedure visit on 04/12/2019  Component Date Value Ref Range Status  . Copper 04/12/2019 117  72  - 166 ug/dL Final                                   Detection Limit = 5  . IgG (Immunoglobin G), Serum 04/12/2019 883  603 - 1,613 mg/dL Final  . IgA/Immunoglobulin A, Serum 04/12/2019 135  90 - 386 mg/dL Final  . IgM (Immunoglobulin M), Srm 04/12/2019 100  20 - 172 mg/dL Final  . Total Protein 04/12/2019 7.1  6.0 - 8.5 g/dL Final  . Albumin SerPl Elph-Mcnc 04/12/2019 4.2  2.9 - 4.4 g/dL Final  . Alpha 1 04/12/2019 0.2  0.0 - 0.4 g/dL Final  . Alpha2 Glob SerPl Elph-Mcnc 04/12/2019 0.8  0.4 - 1.0 g/dL Final  . B-Globulin SerPl Elph-Mcnc 04/12/2019 1.1  0.7 - 1.3 g/dL Final  . Gamma Glob SerPl Elph-Mcnc 04/12/2019 0.8  0.4 - 1.8 g/dL Final  . M Protein SerPl Elph-Mcnc 04/12/2019 Not Observed  Not Observed g/dL Final  . Globulin, Total 04/12/2019 2.9  2.2 - 3.9 g/dL Final  . Albumin/Glob SerPl 04/12/2019 1.5  0.7 - 1.7 Final  . IFE 1 04/12/2019 Comment   Final   Comment: The immunofixation pattern appears unremarkable. Evidence of monoclonal protein is not apparent.   . Please Note 04/12/2019 Comment   Final   Comment: Protein electrophoresis scan will follow via computer, mail, or courier delivery.   . Hgb A1c MFr Bld 04/12/2019 5.7* 4.8 - 5.6 % Final   Comment:          Prediabetes: 5.7 - 6.4          Diabetes: >6.4          Glycemic control for adults with diabetes: <7.0   . Est. average glucose Bld gHb Est-m* 04/12/2019 117  mg/dL Final  . TSH 04/12/2019 1.670  0.450 - 4.500 uIU/mL Final    Assessment:  Adam Holloway is a 49 y.o. male with mild leukocytosis felt reactive.  WBC has ranged between 11,300 - 16,700 in the past 2 months.  Work-up on 01/19/2019 revealed the following normal labs:  hepatitis, hepatitis B core total antibody, hepatitis C antibody, HIV testing, Burgdorferi antibodies, ANA, ACE, rheumatoid factor, RPR, sed rate, amylase, and lipase.  ALT was 48 (0-44).  B12 was 341 (low normal).   Work-up on 02/08/2019 revealed a hematocrit of 46.3, hemoglobin 15.5,  MCV 92.6, platelets 195,000, WBC 10,700 with an ANC of 7100.  Differential was unremarkable.  CRP was < 0.8 (normal).  BCR-ABL was negative.    Peripheral smear revealed persistent mild leukocytosis. There was mild non specific leukocytosis with mature neutrophils and lymphocytes. There was occasional reactive appearing lymphocytes. There was no shift in immaturity. RBC morphology was unremarkable.  Platelets appeared mildly increased in size.  Overall the morphologic findings were not specific and are likely reactive to another process. A hematologic neoplastic process was not favored.   He has a 30 pack year smoking history.  Chest CT on 02/15/2019 revealed no acute cardiopulmonary disease.  There were scattered coronary artery calcifications, mild bilateral gynecomastia, and aortic atherosclerosis  Symptomatically, he is "dragging".  He reports extreme fatigue and shortness of breath.  Symptoms are worse when he takes his pain medicine for his back. He has right hip, leg, and back pain.  Exam is unrevealing.  Plan: 1.   Labs today: CBC with diff, CMP, sed rate, CRP, flow cytometry. 2.   Peripheral smear for pathologic review. 3.   Mild leukocytosis             Review labs during the interim (15 months).  WBC has remained mildly elevated with predominance of neutrophils.  Etiology is felt likely reactive.    Patient notes a constellation of unexplained symptoms.   Discuss consideration of a PET scan.  Etiology may be  the cause of his mild leukocytosis. 4.   PET scan. 5.   Encourage patient to f/u with ER or Urgent Care. 6.   RTC after PET scan for MD assessment and review of work-up.  I discussed the assessment and treatment plan with the patient.  The patient was provided an opportunity to ask questions and all were answered.  The patient agreed with the plan and demonstrated an understanding of the instructions.  The patient was advised to call back or seek an in person evaluation if the  symptoms worsen or if the condition fails to improve as anticipated.  I provided 26 minutes of face-to-face time during this this encounter and > 50% was spent counseling as documented under my assessment and plan.  An additional 15 minutes were spent reviewing his chart (Epic and Care Everywhere) including notes, labs, and imaging studies.    Nolon Stalls, MD, PhD  05/27/2020, 11:54 AM  I, Mirian Mo Tufford, am acting as Education administrator for [employer]. Mike Gip, MD, PhD.  I, Ariadna Setter C. Mike Gip, MD, have reviewed the above documentation for accuracy and completeness, and I agree with the above.

## 2020-05-27 ENCOUNTER — Other Ambulatory Visit: Payer: Self-pay

## 2020-05-27 ENCOUNTER — Inpatient Hospital Stay: Payer: Medicare Other

## 2020-05-27 ENCOUNTER — Ambulatory Visit (INDEPENDENT_AMBULATORY_CARE_PROVIDER_SITE_OTHER): Payer: Medicare Other

## 2020-05-27 ENCOUNTER — Encounter: Payer: Self-pay | Admitting: Emergency Medicine

## 2020-05-27 ENCOUNTER — Inpatient Hospital Stay: Payer: Medicare Other | Attending: Hematology and Oncology | Admitting: Hematology and Oncology

## 2020-05-27 ENCOUNTER — Encounter: Payer: Self-pay | Admitting: Hematology and Oncology

## 2020-05-27 ENCOUNTER — Ambulatory Visit
Admission: EM | Admit: 2020-05-27 | Discharge: 2020-05-27 | Disposition: A | Discharge status: Home / Self Care | Payer: Medicare Other | Attending: Internal Medicine | Admitting: Internal Medicine

## 2020-05-27 VITALS — BP 119/75 | HR 103 | Temp 97.1°F | Resp 18 | Ht 72.0 in | Wt 224.3 lb

## 2020-05-27 DIAGNOSIS — R0602 Shortness of breath: Secondary | ICD-10-CM

## 2020-05-27 DIAGNOSIS — D72829 Elevated white blood cell count, unspecified: Secondary | ICD-10-CM | POA: Diagnosis not present

## 2020-05-27 DIAGNOSIS — Z7982 Long term (current) use of aspirin: Secondary | ICD-10-CM | POA: Diagnosis not present

## 2020-05-27 DIAGNOSIS — R634 Abnormal weight loss: Secondary | ICD-10-CM | POA: Diagnosis not present

## 2020-05-27 DIAGNOSIS — Z801 Family history of malignant neoplasm of trachea, bronchus and lung: Secondary | ICD-10-CM | POA: Insufficient documentation

## 2020-05-27 DIAGNOSIS — R1032 Left lower quadrant pain: Secondary | ICD-10-CM | POA: Diagnosis not present

## 2020-05-27 DIAGNOSIS — R5383 Other fatigue: Secondary | ICD-10-CM | POA: Insufficient documentation

## 2020-05-27 DIAGNOSIS — Z8249 Family history of ischemic heart disease and other diseases of the circulatory system: Secondary | ICD-10-CM | POA: Insufficient documentation

## 2020-05-27 DIAGNOSIS — Z87891 Personal history of nicotine dependence: Secondary | ICD-10-CM | POA: Diagnosis not present

## 2020-05-27 DIAGNOSIS — K5903 Drug induced constipation: Secondary | ICD-10-CM | POA: Diagnosis present

## 2020-05-27 DIAGNOSIS — Z79899 Other long term (current) drug therapy: Secondary | ICD-10-CM | POA: Diagnosis not present

## 2020-05-27 DIAGNOSIS — Z800 Family history of malignant neoplasm of digestive organs: Secondary | ICD-10-CM | POA: Insufficient documentation

## 2020-05-27 HISTORY — DX: Personal history of diseases of the blood and blood-forming organs and certain disorders involving the immune mechanism: Z86.2

## 2020-05-27 LAB — CBC WITH DIFFERENTIAL/PLATELET
Abs Immature Granulocytes: 0.04 10*3/uL (ref 0.00–0.07)
Basophils Absolute: 0 10*3/uL (ref 0.0–0.1)
Basophils Relative: 0 %
Eosinophils Absolute: 0 10*3/uL (ref 0.0–0.5)
Eosinophils Relative: 0 %
HCT: 46.3 % (ref 39.0–52.0)
Hemoglobin: 15.3 g/dL (ref 13.0–17.0)
Immature Granulocytes: 0 %
Lymphocytes Relative: 15 %
Lymphs Abs: 1.9 10*3/uL (ref 0.7–4.0)
MCH: 29.7 pg (ref 26.0–34.0)
MCHC: 33 g/dL (ref 30.0–36.0)
MCV: 89.7 fL (ref 80.0–100.0)
Monocytes Absolute: 0.8 10*3/uL (ref 0.1–1.0)
Monocytes Relative: 6 %
Neutro Abs: 10 10*3/uL — ABNORMAL HIGH (ref 1.7–7.7)
Neutrophils Relative %: 79 %
Platelets: 188 10*3/uL (ref 150–400)
RBC: 5.16 MIL/uL (ref 4.22–5.81)
RDW: 14.5 % (ref 11.5–15.5)
WBC: 12.7 10*3/uL — ABNORMAL HIGH (ref 4.0–10.5)
nRBC: 0 % (ref 0.0–0.2)

## 2020-05-27 LAB — COMPREHENSIVE METABOLIC PANEL
ALT: 28 U/L (ref 0–44)
AST: 20 U/L (ref 15–41)
Albumin: 4.5 g/dL (ref 3.5–5.0)
Alkaline Phosphatase: 67 U/L (ref 38–126)
Anion gap: 7 (ref 5–15)
BUN: 14 mg/dL (ref 6–20)
CO2: 28 mmol/L (ref 22–32)
Calcium: 9.3 mg/dL (ref 8.9–10.3)
Chloride: 103 mmol/L (ref 98–111)
Creatinine, Ser: 1.07 mg/dL (ref 0.61–1.24)
GFR calc Af Amer: >60 mL/min (ref >60)
GFR calc non Af Amer: >60 mL/min (ref >60)
Glucose, Bld: 107 mg/dL — ABNORMAL HIGH (ref 70–99)
Potassium: 4.2 mmol/L (ref 3.5–5.1)
Sodium: 138 mmol/L (ref 135–145)
Total Bilirubin: 0.3 mg/dL (ref 0.3–1.2)
Total Protein: 7.7 g/dL (ref 6.5–8.1)

## 2020-05-27 LAB — URINALYSIS, COMPLETE (UACMP) WITH MICROSCOPIC
Bilirubin Urine: NEGATIVE
Glucose, UA: NEGATIVE mg/dL
Hgb urine dipstick: NEGATIVE
Ketones, ur: NEGATIVE mg/dL
Leukocytes,Ua: NEGATIVE
Nitrite: NEGATIVE
Protein, ur: NEGATIVE mg/dL
Specific Gravity, Urine: 1.025 (ref 1.005–1.030)
pH: 5.5 (ref 5.0–8.0)

## 2020-05-27 LAB — C-REACTIVE PROTEIN: CRP: 0.6 mg/dL (ref <1.0)

## 2020-05-27 LAB — SEDIMENTATION RATE: Sed Rate: 1 mm/hr (ref 0–20)

## 2020-05-27 NOTE — Progress Notes (Signed)
Patient c/o constant back pain ( pain today 6)

## 2020-05-27 NOTE — ED Triage Notes (Signed)
Patient has a history of leukocytosis.

## 2020-05-27 NOTE — ED Triage Notes (Signed)
Patient in today c/o extreme fatigue and sob x 3 weeks. Patient saw oncology today and will have a PET scan and other tests in the future, but has not gotten a diagnosis yet.  Patient states that the oncology office advised him to come here today.

## 2020-05-27 NOTE — Discharge Instructions (Signed)
Get Magnesium Citrate and drink the entire bottle to help you empty out your colon. All your test here are normal except for the abdomen xray and shows a large amount of stool on the areas you are tender. The pain medicine is causing this.  Continue to follow up with your oncologist and primary care Dr.

## 2020-05-27 NOTE — ED Provider Notes (Signed)
[facility]    CSN: [id number] Arrival date & time: 05/27/20  1318      History   Chief Complaint Chief Complaint  Patient presents with  . Fatigue  . Shortness of Breath    HPI WES LEZOTTE is a 49 y.o. male who presents with fatigue and SOB x 3 weeks. Just walking from the parking lot makes him exhausted. Saw his oncologist today and was told to come here. He deneis fever, chills, sweats. Had neg covid test last week. Does not have a cough or CP. Has gotten back on his oxy pain meds since April for his chronic back pain. Has been having intermittent L flank pain and L mid abd pain. Last BM yesterday.     Past Medical History:  Diagnosis Date  . Arthritis    back, left wrist, left knee  . Complication of anesthesia    bp dropped during gallbladder surgery in 2008  . Depression   . Family history of adverse reaction to anesthesia    mom stopped breathing during colonscopy  . GERD (gastroesophageal reflux disease)    h/o  . HA (headache)    h/o migrianes  . Herniated disc, cervical   . History of kidney stones    h/o stones  . History of leukocytosis   . Lumbago   . Pain in soft tissues of limb   . Peripheral neuropathy 04/12/2019  . PTSD (post-traumatic stress disorder)   . Scoliosis     Patient Active Problem List   Diagnosis Date Noted  . Vitamin D deficiency 04/11/2020  . Gastroesophageal reflux disease without esophagitis 04/11/2020  . Anxiety 04/11/2020  . Pain in left wrist 12/13/2019  . Pain in left elbow 12/13/2019  . Neuropathy 04/12/2019  . Low vitamin B12 level 02/20/2019  . Leukocytosis 02/12/2019  . Right-sided chest pain 02/12/2019  . Neck pain 12/07/2018  . HA (headache)   . Pain in soft tissues of limb   . Lumbago   . Gynecomastia, male 07/12/2013    Past Surgical History:  Procedure Laterality Date  . CHOLECYSTECTOMY  2008  . COLONOSCOPY  2010  . COLONOSCOPY WITH PROPOFOL N/A 06/09/2016   Procedure: COLONOSCOPY WITH  PROPOFOL;  Surgeon: Christene Lye, MD;  Location: [facility];  Service: Endoscopy;  Laterality: N/A;  . FISSURECTOMY N/A 06/29/2016   Procedure: FISSURECTOMY;  Surgeon: Christene Lye, MD;  Location: [facility] ORS;  Service: General;  Laterality: N/A;  . HEMORRHOID SURGERY N/A 06/29/2016   Procedure: HEMORRHOIDECTOMY;  Surgeon: Christene Lye, MD;  Location: [facility] OR[facility];  Service: General;  Laterality: N/A;  . PAROTIDECTOMY Left 1997  . SALIVARY GLAND SURGERY  1997  . SPHINCTEROTOMY N/A 06/29/2016   Procedure: SPHINCTEROTOMY;  Surgeon: Christene Lye, MD;  Location: [facility] OR[facility];  Service: General;  Laterality: N/A;  . WRIST ARTHROSCOPY Left 01/26/2017   Procedure: ARTHROSCOPY LEFT WRIST DEBRIDEMENT;  Surgeon: Daryll Brod, MD;  Location: [place];  Service: Orthopedics;  Laterality: Left;  . WRIST SURGERY  2019       Home Medications    Prior to Admission medications   Medication Sig Start Date End Date Taking? Authorizing Provider  aspirin 81 MG EC tablet Take 81 mg by mouth daily.   Yes [provider]  fluticasone furoate-vilanterol (BREO ELLIPTA) 100-25 MCG/INH AEPB Inhale 1 puff into the lungs daily.   Yes [provider]  Multiple Vitamin (MULTIVITAMIN) capsule Take 1 capsule by mouth daily.  Yes [provider]  omeprazole (PRILOSEC) 20 MG capsule TAKE 1 CAPSULE BY MOUTH EVERY DAY 03/19/20  Yes Kathrynn Ducking, MD  oxyCODONE-acetaminophen (PERCOCET/ROXICET) 5-325 MG tablet Take 1 tablet by mouth every 6 (six) hours as needed for severe pain.   Yes [provider]    Family History Family History  Problem Relation Age of Onset  . Cancer Father   . Heart Problems Father   . Lung cancer Father   . Colon cancer Mother   . Liver cancer Brother     Social History Social History   Tobacco Use  . Smoking status: Current Every Day Smoker    Packs/day: 0.50    Years: 32.00    Pack years: 16.00     Types: Cigarettes    Last attempt to quit: 01/16/2019    Years since quitting: 1.3  . Smokeless tobacco: Former Systems developer  . Tobacco comment: Quit 2020 and restarted 10/2019 1-2 cig/day  Vaping Use  . Vaping Use: Never used  Substance Use Topics  . Alcohol use: No    Alcohol/week: 0.0 standard drinks  . Drug use: No     Allergies   Augmentin [amoxicillin-pot clavulanate], Hydrocodone-acetaminophen, Nsaids, Tolmetin, and Meloxicam   Review of Systems Review of Systems  Constitutional: Positive for fatigue. Negative for activity change, appetite change, chills, diaphoresis and fever.  HENT: Negative for congestion, postnasal drip and rhinorrhea.   Eyes: Negative for discharge.  Respiratory: Positive for shortness of breath. Negative for cough and wheezing.   Cardiovascular: Negative for chest pain, palpitations and leg swelling.  Gastrointestinal: Positive for abdominal pain and constipation. Negative for diarrhea, nausea and vomiting.  Genitourinary: Negative for difficulty urinating.  Musculoskeletal: Positive for back pain. Negative for myalgias.       End of July and improved   Neurological: Negative for numbness and headaches.  Hematological: Negative for adenopathy.   Physical Exam Triage Vital Signs ED Triage Vitals [05/27/20 1453]  Enc Vitals Group     BP 126/75     Pulse Rate 82     Resp 18     Temp 98.6 F (37 C)     Temp Source Oral     SpO2 100 %     Weight 224 lb (101.6 kg)     Height 6' (1.829 m)     Head Circumference      Peak Flow      Pain Score 0     Pain Loc      Pain Edu?      Excl. in [place]?    No data found.  Updated Vital Signs BP 126/75 (BP Location: Left Arm)   Pulse 82   Temp 98.6 F (37 C) (Oral)   Resp 18   Ht 6' (1.829 m)   Wt 224 lb (101.6 kg)   SpO2 100%   BMI 30.38 kg/m   Visual Acuity Right Eye Distance:   Left Eye Distance:   Bilateral Distance:    Right Eye Near:   Left Eye Near:    Bilateral Near:     Physical  Exam Constitutional:      General: He is not in acute distress.    Appearance: He is not ill-appearing, toxic-appearing or diaphoretic.  HENT:     Head: Normocephalic.  Eyes:     Pupils: Pupils are equal, round, and reactive to light.  Cardiovascular:     Rate and Rhythm: Normal rate and regular rhythm.     Heart  sounds: No murmur heard.   Pulmonary:     Effort: Pulmonary effort is normal. No tachypnea.     Breath sounds: Normal breath sounds.     Comments: Ambulatory pulse ox is normal. Abdominal:     General: Bowel sounds are normal.     Palpations: Abdomen is soft. There is no hepatomegaly, splenomegaly or mass.     Tenderness: There is abdominal tenderness. There is no guarding or rebound.     Comments: On L mid and R mid abdomen  Musculoskeletal:        General: Normal range of motion.     Cervical back: Neck supple.  Skin:    General: Skin is warm and dry.     Findings: No rash.  Neurological:     General: No focal deficit present.     Mental Status: He is alert and oriented to person, place, and time.  Psychiatric:        Mood and Affect: Mood normal.        Behavior: Behavior normal.    UC Treatments / Results  Labs (all labs ordered are listed, but only abnormal results are displayed) Labs Reviewed - No data to display  EKG NSR, normal.   Radiology No results found.  Procedures Procedures (including critical care time)  Medications Ordered in UC Medications - No data to display  Initial Impression / Assessment and Plan / UC Course  I have reviewed the triage vital signs and the nursing notes. I reviewed all the labs he had today and other than the elevation of WBC are normal. His EKG, CXR are normal today. Ambulatory pulse ox was also normal.  Advised to Fu with his PCP and continue work up with his oncologist. Needs to take Magnesium Citrate to empty his colon.  He wants to get off oxy and will try to look for an inpatient rehab to get off since if  he skips a dose gets palpitations and sweating.  Pertinent labs & imaging results that were available during my care of the patient were reviewed by me and considered in my medical decision making (see chart for details).  Final Clinical Impressions(s) / UC Diagnoses   Final diagnoses:  None   Discharge Instructions   None    ED Prescriptions    None     PDMP not reviewed this encounter.   Shelby Mattocks, [place] 05/27/20 1742

## 2020-05-28 ENCOUNTER — Telehealth: Payer: Self-pay | Admitting: Hematology and Oncology

## 2020-05-28 LAB — URINE CULTURE
Culture: 10000 — AB
Special Requests: NORMAL

## 2020-05-28 LAB — PATHOLOGIST SMEAR REVIEW

## 2020-05-28 NOTE — Telephone Encounter (Signed)
I talked to Adam Holloway about his up coming appts. His Pet scan is 9/16 and follow up with Dr C is 9/20. He understood and will keep appts. He also expressed a need for drug detox. I will try to find out that info for him.

## 2020-05-29 ENCOUNTER — Telehealth: Payer: Self-pay | Admitting: Hematology and Oncology

## 2020-05-29 NOTE — Telephone Encounter (Signed)
Mikai called and moved his PET scan out a week. He is at ER in [place] right now and said he is extremely exhausted and his medication made him stop breathing last night.

## 2020-05-30 ENCOUNTER — Ambulatory Visit: Payer: Medicare Other

## 2020-05-30 LAB — COMP PANEL: LEUKEMIA/LYMPHOMA

## 2020-05-31 ENCOUNTER — Telehealth: Payer: Self-pay

## 2020-05-31 NOTE — Telephone Encounter (Signed)
Spoke with the parient to inform him that his Flow Cytometry was normal. The patient was understanding and agreeable.

## 2020-05-31 NOTE — Telephone Encounter (Signed)
-----   Message from Lequita Asal, MD sent at 05/30/2020  5:07 PM EDT ----- Regarding: Please call patient  Flow cytometry is normal.  M ----- Message ----- From: Buel Ream, Lab In [place] Sent: 05/27/2020  12:54 PM EDT To: Lequita Asal, MD

## 2020-06-03 ENCOUNTER — Ambulatory Visit: Payer: Medicare Other | Admitting: Hematology and Oncology

## 2020-06-06 ENCOUNTER — Encounter
Admission: RE | Admit: 2020-06-06 | Discharge: 2020-06-06 | Disposition: A | Discharge status: Home / Self Care | Payer: Medicare Other | Source: Ambulatory Visit | Attending: Hematology and Oncology | Admitting: Hematology and Oncology

## 2020-06-06 ENCOUNTER — Other Ambulatory Visit: Payer: Self-pay

## 2020-06-06 DIAGNOSIS — I2510 Atherosclerotic heart disease of native coronary artery without angina pectoris: Secondary | ICD-10-CM | POA: Diagnosis not present

## 2020-06-06 DIAGNOSIS — R634 Abnormal weight loss: Secondary | ICD-10-CM | POA: Insufficient documentation

## 2020-06-06 DIAGNOSIS — I700 Atherosclerosis of aorta: Secondary | ICD-10-CM | POA: Insufficient documentation

## 2020-06-06 LAB — GLUCOSE, CAPILLARY: Glucose-Capillary: 108 mg/dL — ABNORMAL HIGH (ref 70–99)

## 2020-06-06 MED ORDER — FLUDEOXYGLUCOSE F - 18 (FDG) INJECTION
11.8100 | Freq: Once | INTRAVENOUS | Status: AC | PRN
  Administered 2020-06-06: 11.81 via INTRAVENOUS

## 2020-06-08 NOTE — Progress Notes (Signed)
[facility]  [address] [place], [place] [address] Phone: [phone]  Fax: [phone]   Clinic Day:  06/10/2020  Referring physician: [name], MD  Chief Complaint: Adam Holloway is a 49 y.o. male with leukocytosis and weight loss who is seen for review of interval PET scan.  HPI: The patient was last seen in the hematology clinic on 05/27/2020. At that time, he was "dragging".  He reported extreme fatigue and shortness of breath.  Symptoms were worse when he took his pain medicine for his back. He had right hip, leg, and back pain.  Exam was unrevealing. Patient was encourage to follow with ER or Urgent Care.   Labs revealed a hematocrit 46.3, hemoglobin 15.3, platelets 188,00,WBC 12,700 (ANC 10,000). CMP was normal. CRP 0.6. Sed rate was 1. Flow cytometry was unremarkable. Peripheral smear revealed neutrophilia. There was no shift in immaturity. Morphology of RBCs was within normal limits. Platelets were mildly enlarged. There was no evidence of circulating blasts or schistocytes.  He was evaluated at [facility] on 05/27/2020 for fatigue and shortness of breath x 3 weeks. Just walking from the parking lot made him exhausted. He denied fever, chills, or sweats. He had negative COVID test the week prior.  He did not have a cough or chest pain. He had gotten back on his oxycodone since April for his chronic back pain. He had been having intermittent left flank pain and mid abdominal pain. EKG and CXR were normal. Ambulatory pulse oximetry was also normal.   He was seen in the [facility] ER in [place] on 05/29/2020 for a 2-week history of fatigue, chest tightness and difficulty breathing in the setting of opiate use.  COVID-19 testing was negative.  Toxicology screen was negative.  Alcohol level was < 10 mg/dL.  Troponin was negative CBC revealed a hematocrit of 43.3, hemoglobin 14.3, MCV 89.5, platelets 166,000, WBC 14,500 with an ANC of  11,000.  His multiple symptoms were felt possibly related to her history of drug use.  There was opening at [facility].  PET scan on 06/06/2020 revealed no significant hypermetabolic lesion is identified to suggest hypermetabolic malignancy in the setting of the patient's unintentional weight loss.  During the interim, he stated he was "dragging". He is fatigued and dehydrated with associated dizziness. He notes a decrease in urination. He is constipated. His weight is down 4 pounds. His ideal weight is 180 lbs. He is eating a healthy diet and drinking Ensure. He drinks Gatorade.   His shortness of breath was worse with pain medication. He is 13 days without pain medication. His shortness of breath is stable. He reports a slow heart rate when he lays down. He smokes 2 cigarettes per day. He reports constant lower back pain (7/10). Right neck swelling resolved.    Past Medical History:  Diagnosis Date  . Arthritis    back, left wrist, left knee  . Complication of anesthesia    bp dropped during gallbladder surgery in 2008  . Depression   . Family history of adverse reaction to anesthesia    mom stopped breathing during colonscopy  . GERD (gastroesophageal reflux disease)    h/o  . HA (headache)    h/o migrianes  . Herniated disc, cervical   . History of kidney stones    h/o stones  . History of leukocytosis   . Lumbago   . Pain in soft tissues of limb   . Peripheral neuropathy 04/12/2019  .  PTSD (post-traumatic stress disorder)   . Scoliosis     Past Surgical History:  Procedure Laterality Date  . CHOLECYSTECTOMY  2008  . COLONOSCOPY  2010  . COLONOSCOPY WITH PROPOFOL N/A 06/09/2016   Procedure: COLONOSCOPY WITH PROPOFOL;  Surgeon: Christene Lye, MD;  Location: [facility];  Service: Endoscopy;  Laterality: N/A;  . FISSURECTOMY N/A 06/29/2016   Procedure: FISSURECTOMY;  Surgeon: Christene Lye, MD;  Location: [facility] ORS;  Service: General;  Laterality: N/A;   . HEMORRHOID SURGERY N/A 06/29/2016   Procedure: HEMORRHOIDECTOMY;  Surgeon: Christene Lye, MD;  Location: [facility] ORS;  Service: General;  Laterality: N/A;  . PAROTIDECTOMY Left 1997  . SALIVARY GLAND SURGERY  1997  . SPHINCTEROTOMY N/A 06/29/2016   Procedure: SPHINCTEROTOMY;  Surgeon: Christene Lye, MD;  Location: [facility] ORS;  Service: General;  Laterality: N/A;  . WRIST ARTHROSCOPY Left 01/26/2017   Procedure: ARTHROSCOPY LEFT WRIST DEBRIDEMENT;  Surgeon: Daryll Brod, MD;  Location: [place];  Service: Orthopedics;  Laterality: Left;  . WRIST SURGERY  2019    Family History  Problem Relation Age of Onset  . Cancer Father   . Heart Problems Father   . Lung cancer Father   . Colon cancer Mother   . Liver cancer Brother     Social History:  reports that he has been smoking cigarettes. He has a 16.00 pack-year smoking history. He has quit using smokeless tobacco. He reports that he does not drink alcohol and does not use drugs. He does not drink alcohol, and stopped in 2006. He smoked a pack a day for 30 years until 01/16/2019. He is currently smoking 1-2 cigarettes daily. He denies any known exposure to radiation or toxins. He does not currently work, but previously worked in Psychologist, educational and for a Engineer, civil (consulting). He is on disability for his back issues and PTSD. The patient is alone today.  Allergies:  Allergies  Allergen Reactions  . Augmentin [Amoxicillin-Pot Clavulanate] Anaphylaxis  . Hydrocodone-Acetaminophen Shortness Of Breath    Pt states he is not allergic to Hydrocodone and would like it to be removed from his chart.  . Nsaids Other (See Comments)    Chest pain  . Tolmetin Other (See Comments)    Chest pain  . Meloxicam Other (See Comments)    Chest pain    Current Medications: Current Outpatient Medications  Medication Sig Dispense Refill  . aspirin 81 MG EC tablet Take 81 mg by mouth daily. (Patient not taking: Reported on  06/10/2020)    . fluticasone furoate-vilanterol (BREO ELLIPTA) 100-25 MCG/INH AEPB Inhale 1 puff into the lungs daily. (Patient not taking: Reported on 06/10/2020)    . Multiple Vitamin (MULTIVITAMIN) capsule Take 1 capsule by mouth daily. (Patient not taking: Reported on 06/10/2020)    . omeprazole (PRILOSEC) 20 MG capsule TAKE 1 CAPSULE BY MOUTH EVERY DAY (Patient not taking: Reported on 06/10/2020) 90 capsule 1  . oxyCODONE-acetaminophen (PERCOCET/ROXICET) 5-325 MG tablet Take 1 tablet by mouth every 6 (six) hours as needed for severe pain. (Patient not taking: Reported on 06/10/2020)     No current facility-administered medications for this visit.    Review of Systems  Constitutional: Positive for malaise/fatigue (extreme) and weight loss (4 lbs, goal weight is 180 lbs). Negative for chills, diaphoresis and fever.       Feels like he is "dragging". Feels dehydrated.  HENT: Negative for congestion, ear discharge, ear pain, hearing loss, nosebleeds, sinus pain, sore throat and  tinnitus.   Eyes: Negative for blurred vision.  Respiratory: Positive for shortness of breath (stable). Negative for cough and sputum production.   Cardiovascular: Negative for chest pain, palpitations, orthopnea, leg swelling and PND.  Gastrointestinal: Positive for constipation. Negative for abdominal pain, blood in stool, diarrhea, heartburn, nausea and vomiting.       On diet since 02/26/2020, but states that his diet hasn't changed much.  Genitourinary: Negative for dysuria, frequency, hematuria and urgency.  Musculoskeletal: Positive for back pain (7/10). Negative for joint pain, myalgias and neck pain.       Right neck swelling, resolved.  Skin: Negative for itching and rash.  Neurological: Positive for dizziness. Negative for tingling, sensory change, weakness and headaches.  Endo/Heme/Allergies: Does not bruise/bleed easily.  Psychiatric/Behavioral: Negative for depression and memory loss. The patient is not  nervous/anxious and does not have insomnia.   All other systems reviewed and are negative.   Performance status (ECOG): 2  Vitals: Blood pressure 123/75, pulse 78, temperature 97.8 F (36.6 C), temperature source Tympanic, weight 220 lb 7.4 oz (100 kg), SpO2 100 %.  Physical Exam Vitals and nursing note reviewed.  Constitutional:      General: He is not in acute distress.    Appearance: He is well-developed.  HENT:     Head: Normocephalic and atraumatic.     Comments: Short dark hair.    Mouth/Throat:     Mouth: Mucous membranes are moist.     Pharynx: Oropharynx is clear.  Eyes:     General: No scleral icterus.    Conjunctiva/sclera: Conjunctivae normal.     Pupils: Pupils are equal, round, and reactive to light.     Comments: Blue eyes.  Cardiovascular:     Rate and Rhythm: Normal rate and regular rhythm.     Heart sounds: Normal heart sounds. No murmur heard.   Pulmonary:     Effort: Pulmonary effort is normal. No respiratory distress.     Breath sounds: Normal breath sounds. No wheezing or rales.  Chest:     Chest wall: No tenderness.  Abdominal:     General: Bowel sounds are normal. There is no distension.     Palpations: Abdomen is soft. There is no hepatomegaly, splenomegaly or mass.     Tenderness: There is abdominal tenderness (RUQ with palpation). There is no guarding or rebound.  Musculoskeletal:        General: No swelling or tenderness. Normal range of motion.     Cervical back: Normal range of motion and neck supple.  Lymphadenopathy:     Head:     Right side of head: No preauricular, posterior auricular or occipital adenopathy.     Left side of head: No preauricular, posterior auricular or occipital adenopathy.     Cervical: No cervical adenopathy.     Upper Body:     Right upper body: No supraclavicular or axillary adenopathy.     Left upper body: No supraclavicular or axillary adenopathy.     Lower Body: No right inguinal adenopathy. No left inguinal  adenopathy.  Skin:    General: Skin is warm and dry.  Neurological:     Mental Status: He is alert and oriented to person, place, and time.  Psychiatric:        Behavior: Behavior normal.        Thought Content: Thought content normal.        Judgment: Judgment normal.    No visits with results within 3 Day(s) from  this visit.  Latest known visit with results is:  Hospital Outpatient Visit on 06/06/2020  Component Date Value Ref Range Status  . Glucose-Capillary 06/06/2020 108* 70 - 99 mg/dL Final   Glucose reference range applies only to samples taken after fasting for at least 8 hours.    Assessment:  Adam Holloway is a 49 y.o. male with mild leukocytosis felt reactive.  WBC has ranged between 11,300 - 16,700 in the past 2 months.  Work-up on 01/19/2019 revealed the following normal labs:  hepatitis, hepatitis B core total antibody, hepatitis C antibody, HIV testing, Burgdorferi antibodies, ANA, ACE, rheumatoid factor, RPR, sed rate, amylase, and lipase.  ALT was 48 (0-44).  B12 was 341 (low normal).   Work-up on 02/08/2019 revealed a hematocrit of 46.3, hemoglobin 15.5, MCV 92.6, platelets 195,000, WBC 10,700 with an ANC of 7100.  Differential was unremarkable.  CRP was < 0.8 (normal).  BCR-ABL was negative.    Peripheral smear revealed persistent mild leukocytosis. There was mild non specific leukocytosis with mature neutrophils and lymphocytes. There was occasional reactive appearing lymphocytes. There was no shift in immaturity. RBC morphology was unremarkable.  Platelets appeared mildly increased in size.  Overall the morphologic findings were not specific and are likely reactive to another process. A hematologic neoplastic process was not favored.   He has a 30 pack year smoking history.  Chest CT on 02/15/2019 revealed no acute cardiopulmonary disease.  There were scattered coronary artery calcifications, mild bilateral gynecomastia, and aortic atherosclerosis.  PET scan  on 06/06/2020 revealed no significant hypermetabolic lesion is identified to suggest hypermetabolic malignancy in the setting of the patient's unintentional weight loss.  Symptomatically, he has a constellation of symptoms felt possibly related to discontinuation of pain medications.    Plan: 1.   Review PET-scan and work up. 2.   Mild leukocytosis  WBC has remained mildly elevated with predominance of neutrophils.  He is felt to have a reactive leukocytosis.    He has a constellation of unexplained symptoms.   PET scan was negative.  Review interim evaluations (ER and Urgent Care). 3.   Weight loss  Weight loss of 4 pounds.  Etiology remains unclear.  Review interval PET scan.  Images personally reviewed.   No evidence of malignancy. 4.   RTC in 3-6 months for MD assessment and labs (CBC with diff and +/- others).  I discussed the assessment and treatment plan with the patient.  The patient was provided an opportunity to ask questions and all were answered.  The patient agreed with the plan and demonstrated an understanding of the instructions.  The patient was advised to call back or seek an in person evaluation if the symptoms worsen or if the condition fails to improve as anticipated.   Nolon Stalls, MD, PhD  06/10/2020, 10:47 AM  I, Selena Batten, am acting as scribe for [place]. Mike Gip, MD, PhD.  I, Jakorey Mcconathy C. Mike Gip, MD, have reviewed the above documentation for accuracy and completeness, and I agree with the above.

## 2020-06-10 ENCOUNTER — Encounter: Payer: Self-pay | Admitting: Hematology and Oncology

## 2020-06-10 ENCOUNTER — Other Ambulatory Visit: Payer: Self-pay

## 2020-06-10 ENCOUNTER — Inpatient Hospital Stay (HOSPITAL_BASED_OUTPATIENT_CLINIC_OR_DEPARTMENT_OTHER): Payer: Medicare Other | Admitting: Hematology and Oncology

## 2020-06-10 VITALS — BP 123/75 | HR 78 | Temp 97.8°F | Wt 220.5 lb

## 2020-06-10 DIAGNOSIS — D72829 Elevated white blood cell count, unspecified: Secondary | ICD-10-CM | POA: Diagnosis not present

## 2020-06-10 DIAGNOSIS — R634 Abnormal weight loss: Secondary | ICD-10-CM | POA: Diagnosis not present

## 2020-06-10 NOTE — Progress Notes (Signed)
The patient c/o lower back pain (7) constant, but has stopped all medication

## 2020-06-11 ENCOUNTER — Encounter: Payer: Self-pay | Admitting: Neurology

## 2020-06-11 ENCOUNTER — Ambulatory Visit (INDEPENDENT_AMBULATORY_CARE_PROVIDER_SITE_OTHER): Payer: Medicare Other | Admitting: Neurology

## 2020-06-11 VITALS — BP 119/78 | HR 86 | Ht 72.0 in | Wt 218.0 lb

## 2020-06-11 DIAGNOSIS — R519 Headache, unspecified: Secondary | ICD-10-CM

## 2020-06-11 DIAGNOSIS — R002 Palpitations: Secondary | ICD-10-CM | POA: Diagnosis not present

## 2020-06-11 NOTE — Progress Notes (Signed)
Reason for visit: Headache, palpitations of the heart, shortness of breath, fatigue  Adam Holloway is an 49 y.o. male  History of present illness:  Adam Holloway is a 49 year old right-handed white male with a history of low back pain and left leg discomfort.  The patient comes into the office today with a new problem.  Over the last month or so, he has had almost daily episodes of unusual symptoms.  He will get a headache type discomfort between the eyes and eventually get some numbness in the back of the head.  He will have some discomfort in the left medial elbow and then have a sensation that the heart rhythm is not normal.  The patient will develop shortness of breath and severe fatigue.  The episodes may last anywhere from 5 minutes to up to 8 hours.  He may also have some numbness on the left foot with the above episodes.  He has had multiple emergency room visits, the EKGs have been unremarkable.  The patient claims that he was not symptomatic at the time of the EKG however.  The patient is undergone a chest x-ray that was unremarkable.  He denies any photophobia or phonophobia or nausea or vomiting with the events.  Sometimes eating a meal will bring on the events.  He comes to the office today for an evaluation.  He recently went off of opiate medications, he has not taken any oxycodone for 2 weeks.  Past Medical History:  Diagnosis Date  . Arthritis    back, left wrist, left knee  . Complication of anesthesia    bp dropped during gallbladder surgery in 2008  . Depression   . Family history of adverse reaction to anesthesia    mom stopped breathing during colonscopy  . GERD (gastroesophageal reflux disease)    h/o  . HA (headache)    h/o migrianes  . Herniated disc, cervical   . History of kidney stones    h/o stones  . History of leukocytosis   . Lumbago   . Pain in soft tissues of limb   . Peripheral neuropathy 04/12/2019  . PTSD (post-traumatic stress disorder)   .  Scoliosis     Past Surgical History:  Procedure Laterality Date  . CHOLECYSTECTOMY  2008  . COLONOSCOPY  2010  . COLONOSCOPY WITH PROPOFOL N/A 06/09/2016   Procedure: COLONOSCOPY WITH PROPOFOL;  Surgeon: Christene Lye, MD;  Location: [facility];  Service: Endoscopy;  Laterality: N/A;  . FISSURECTOMY N/A 06/29/2016   Procedure: FISSURECTOMY;  Surgeon: Christene Lye, MD;  Location: [facility] ORS;  Service: General;  Laterality: N/A;  . HEMORRHOID SURGERY N/A 06/29/2016   Procedure: HEMORRHOIDECTOMY;  Surgeon: Christene Lye, MD;  Location: [facility] ORS;  Service: General;  Laterality: N/A;  . PAROTIDECTOMY Left 1997  . SALIVARY GLAND SURGERY  1997  . SPHINCTEROTOMY N/A 06/29/2016   Procedure: SPHINCTEROTOMY;  Surgeon: Christene Lye, MD;  Location: [facility] ORS;  Service: General;  Laterality: N/A;  . WRIST ARTHROSCOPY Left 01/26/2017   Procedure: ARTHROSCOPY LEFT WRIST DEBRIDEMENT;  Surgeon: Daryll Brod, MD;  Location: [place];  Service: Orthopedics;  Laterality: Left;  . WRIST SURGERY  2019    Family History  Problem Relation Age of Onset  . Cancer Father   . Heart Problems Father   . Lung cancer Father   . Colon cancer Mother   . Liver cancer Brother     Social history:  reports that he  has been smoking cigarettes. He has a 16.00 pack-year smoking history. He has quit using smokeless tobacco. He reports that he does not drink alcohol and does not use drugs.    Allergies  Allergen Reactions  . Augmentin [Amoxicillin-Pot Clavulanate] Anaphylaxis  . Hydrocodone-Acetaminophen Shortness Of Breath    Pt states he is not allergic to Hydrocodone and would like it to be removed from his chart.  . Nsaids Other (See Comments)    Chest pain  . Tolmetin Other (See Comments)    Chest pain  . Meloxicam Other (See Comments)    Chest pain    Medications:  Prior to Admission medications   Not on File    ROS:  Out of a complete 14 system review  of symptoms, the patient complains only of the following symptoms, and all other reviewed systems are negative.  Headache Shortness of breath, palpitations of the heart  Blood pressure 119/78, pulse 86, height 6' (1.829 m), weight 218 lb (98.9 kg).  Physical Exam  General: The patient is alert and cooperative at the time of the examination.  Skin: No significant peripheral edema is noted.   Neurologic Exam  Mental status: The patient is alert and oriented x 3 at the time of the examination. The patient has apparent normal recent and remote memory, with an apparently normal attention span and concentration ability.   Cranial nerves: Facial symmetry is present. Speech is normal, no aphasia or dysarthria is noted. Extraocular movements are full. Visual fields are full.  Motor: The patient has good strength in all 4 extremities.  Sensory examination: Soft touch sensation is symmetric on the face, arms, and legs.  Coordination: The patient has good finger-nose-finger and heel-to-shin bilaterally.  Gait and station: The patient has a normal gait. Tandem gait is normal. Romberg is negative. No drift is seen.  Reflexes: Deep tendon reflexes are symmetric.   Assessment/Plan:  1.  Episodes of headache, shortness of breath, palpitations  2.  History of chronic low back pain, left leg discomfort  The patient is having unusual symptoms that are occurring frequently and may last several hours.  I will check a 14-day cardiac monitor study, if this is within normal limits, I will place the patient on a medication for anxiety issues.  The patient otherwise will follow up here in about 4 months.   Adam Alexanders MD 06/11/2020 11:53 AM  [facility] [address] [place], [place] [address]  Phone [phone] Fax [phone]

## 2020-06-17 ENCOUNTER — Ambulatory Visit (INDEPENDENT_AMBULATORY_CARE_PROVIDER_SITE_OTHER): Payer: Medicare Other

## 2020-06-17 DIAGNOSIS — R002 Palpitations: Secondary | ICD-10-CM

## 2020-06-18 ENCOUNTER — Other Ambulatory Visit: Payer: Self-pay | Admitting: Neurology

## 2020-06-18 ENCOUNTER — Telehealth: Payer: Self-pay | Admitting: Neurology

## 2020-06-18 DIAGNOSIS — G4489 Other headache syndrome: Secondary | ICD-10-CM

## 2020-06-18 NOTE — Telephone Encounter (Signed)
medicare order sent to GI. No auth they will reach out to the patient to schedule.  

## 2020-06-23 ENCOUNTER — Ambulatory Visit
Admission: RE | Admit: 2020-06-23 | Discharge: 2020-06-23 | Disposition: A | Discharge status: Home / Self Care | Payer: Medicare Other | Source: Ambulatory Visit | Attending: Neurology | Admitting: Neurology

## 2020-06-23 DIAGNOSIS — G4489 Other headache syndrome: Secondary | ICD-10-CM

## 2020-06-24 ENCOUNTER — Telehealth: Payer: Self-pay | Admitting: Neurology

## 2020-06-24 NOTE — Telephone Encounter (Signed)
I called the patient.  MRI of the brain was reported out as having some minimal chronic microvascular changes, but by my review appears to be essentially normal.  No evidence of demyelinating disease.  The patient continues to have ongoing symptoms, minimal headache, facial numbness, chest tightness.  He has a cardiac monitor on currently.  He wonders whether this could be related to medication withdrawal a month ago.  If the work-up is negative, we may consider treatment with a medication such as propranolol.  There may be some underlying anxiety issues.   MRI brain 06/23/20:  IMPRESSION: This MRI of the brain without contrast shows the following: 1.   Few scattered T2/FLAIR hyperintense foci in the subcortical deep white matter of the hemispheres.  This is a nonspecific finding but most likely represents very minimal chronic microvascular ischemic change.  None of the foci appear to be acute. 2.   There were no acute findings.

## 2020-07-05 ENCOUNTER — Telehealth: Payer: Self-pay | Admitting: Neurology

## 2020-07-05 DIAGNOSIS — M25522 Pain in left elbow: Secondary | ICD-10-CM

## 2020-07-05 NOTE — Telephone Encounter (Signed)
I called the patient.  The cardiac monitor study did show an episode of atrial flutter lasting about 12 minutes.  I would consider a cardiology evaluation to determine whether this issue needs further evaluation or management.  If the patient is amenable to this, I will get this set up.  He also indicates that he is willing to do EMG study for the left elbow pain, I will get this set up.    Cardiac monitor 07/04/20:  Narrative & Impression   Patient had a min HR of 50 bpm, max HR of 160 bpm.  Predominant underlying rhythm was sinus rhythm.  Patient had episode of atrial flutter on 06/18/20 lasting ~ 12 minutes; average rate 80 bpm.  Isolated PACs were rare (<1.0%).  Isolated PVCs were rare (<1.0%).  Trigger events with associated with sinus rhythm or sinus tachycardia.   Atrial Flutter found on monitor with less than 1% burden.

## 2020-07-07 ENCOUNTER — Other Ambulatory Visit: Payer: Self-pay | Admitting: Neurology

## 2020-07-07 DIAGNOSIS — I4892 Unspecified atrial flutter: Secondary | ICD-10-CM

## 2020-07-09 ENCOUNTER — Encounter: Payer: Self-pay | Admitting: Neurology

## 2020-07-09 ENCOUNTER — Other Ambulatory Visit: Payer: Self-pay

## 2020-07-09 ENCOUNTER — Ambulatory Visit (INDEPENDENT_AMBULATORY_CARE_PROVIDER_SITE_OTHER): Payer: Medicare Other | Admitting: Neurology

## 2020-07-09 DIAGNOSIS — M25522 Pain in left elbow: Secondary | ICD-10-CM | POA: Diagnosis not present

## 2020-07-09 NOTE — Progress Notes (Signed)
Please refer to EMG and nerve conduction procedure note.  

## 2020-07-09 NOTE — Procedures (Signed)
     HISTORY:  Adam Holloway is a 49 year old patient with history of left elbow discomfort that has been present for several weeks.  He denies any significant neck pain, at times the pain goes down the forearm into the wrist.  He is being evaluated for a possible neuropathy or a cervical radiculopathy.  NERVE CONDUCTION STUDIES:  Nerve conduction studies were performed on both upper extremities. The distal motor latencies and motor amplitudes for the median and ulnar nerves were within normal limits, with exception of a minimal prolongation of the distal motor latency of the left median nerve. The nerve conduction velocities for these nerves were also normal. The sensory latencies for the median and ulnar nerves were normal. The F wave latencies for the ulnar nerves were within normal limits.   EMG STUDIES:  EMG study was performed on the left upper extremity:  The first dorsal interosseous muscle reveals 2 to 4 K units with full recruitment. No fibrillations or positive waves were noted. The abductor pollicis brevis muscle reveals 2 to 4 K units with full recruitment. No fibrillations or positive waves were noted. The extensor indicis proprius muscle reveals 1 to 3 K units with full recruitment. No fibrillations or positive waves were noted. The pronator teres muscle reveals 2 to 3 K units with full recruitment. No fibrillations or positive waves were noted. The biceps muscle reveals 1 to 2 K units with full recruitment. No fibrillations or positive waves were noted. The triceps muscle reveals 2 to 4 K units with full recruitment. No fibrillations or positive waves were noted. The anterior deltoid muscle reveals 2 to 3 K units with full recruitment. No fibrillations or positive waves were noted. The cervical paraspinal muscles were tested at 2 levels. No abnormalities of insertional activity were seen at either level tested. There was good relaxation.   IMPRESSION:  Nerve conduction  studies done on both upper extremities were relatively unremarkable with exception of a very minimal prolongation of the distal motor latency left median nerve.  A borderline carpal tunnel syndrome may be present.  EMG evaluation of the left upper extremity was unremarkable, no evidence of an overlying cervical radiculopathy was seen.  Jill Alexanders MD 07/09/2020 3:07 PM  [facility] [address] [place], [place] [address]  Phone [phone] Fax [phone]

## 2020-07-09 NOTE — Progress Notes (Addendum)
The patient comes in today, he is having pain is worse in the medial and lateral epicondylar areas of the elbow, he has pain with rotational movement of the elbow.  He is being evaluated for possible neuropathy or radiculopathy.  The nerve conduction study shows minimal prolongation of the distal motor latency for the left median nerve, the patient will try getting a wrist splint see if this helps some of his symptoms.     [place]    Nerve / Sites Muscle Latency Ref. Amplitude Ref. Rel Amp Segments Distance Velocity Ref. Area    ms ms mV mV %  cm m/s m/s mVms  L Median - APB     Wrist APB 4.1 ?4.4 5.0 ?4.0 100 Wrist - APB 7   27.0     Upper arm APB 8.3  5.5  109 Upper arm - Wrist 22 53 ?49 28.4  R Median - APB     Wrist APB 3.3 ?4.4 8.5 ?4.0 100 Wrist - APB 7   28.1     Upper arm APB 7.6  7.8  91.9 Upper arm - Wrist 24 55 ?49 26.3  L Ulnar - ADM     Wrist ADM 2.9 ?3.3 10.9 ?6.0 100 Wrist - ADM 7   31.0     B.Elbow ADM 6.8  9.4  85.8 B.Elbow - Wrist 22 57 ?49 27.4     A.Elbow ADM 8.6  9.6  102 A.Elbow - B.Elbow 10 55 ?49 27.9         A.Elbow - Wrist      R Ulnar - ADM     Wrist ADM 2.6 ?3.3 8.4 ?6.0 100 Wrist - ADM 7   32.5     B.Elbow ADM 6.4  8.2  97.4 B.Elbow - Wrist 23 62 ?49 26.7     A.Elbow ADM 8.0  9.2  113 A.Elbow - B.Elbow 10 60 ?49 32.2         A.Elbow - Wrist                 SNC    Nerve / Sites Rec. Site Peak Lat Ref.  Amp Ref. Segments Distance    ms ms V V  cm  L Radial - Anatomical snuff box (Forearm)     Forearm Wrist 2.4 ?2.9 20 ?15 Forearm - Wrist 10  R Radial - Anatomical snuff box (Forearm)     Forearm Wrist 2.2 ?2.9 26 ?15 Forearm - Wrist 10  L Median - Orthodromic (Dig II, Mid palm)     Dig II Wrist 2.8 ?3.4 13 ?10 Dig II - Wrist 13  R Median - Orthodromic (Dig II, Mid palm)     Dig II Wrist 2.8 ?3.4 14 ?10 Dig II - Wrist 13  L Ulnar - Orthodromic, (Dig V, Mid palm)     Dig V Wrist 2.5 ?3.1 9 ?5 Dig V - Wrist 11  R Ulnar - Orthodromic, (Dig V, Mid palm)      Dig V Wrist 2.6 ?3.1 6 ?5 Dig V - Wrist 32                 F  Wave    Nerve F Lat Ref.   ms ms  L Ulnar - ADM 29.2 ?32.0  R Ulnar - ADM 30.0 ?32.0

## 2020-07-10 ENCOUNTER — Other Ambulatory Visit: Payer: Self-pay

## 2020-07-10 ENCOUNTER — Encounter: Payer: Self-pay | Admitting: Orthopaedic Surgery

## 2020-07-10 ENCOUNTER — Ambulatory Visit (INDEPENDENT_AMBULATORY_CARE_PROVIDER_SITE_OTHER): Payer: Medicare Other | Admitting: Orthopaedic Surgery

## 2020-07-10 DIAGNOSIS — M7712 Lateral epicondylitis, left elbow: Secondary | ICD-10-CM | POA: Diagnosis not present

## 2020-07-10 MED ORDER — LIDOCAINE HCL 1 % IJ SOLN
1.0000 mL | INTRAMUSCULAR | Status: AC | PRN
  Administered 2020-07-10: 1 mL

## 2020-07-10 NOTE — Progress Notes (Signed)
Office Visit Note   Patient: Adam Holloway           Date of Birth: 07-23-1971           MRN: [id number] Visit Date: 07/10/2020              Requested by: [name], MD [address] [place],  [place] [address] PCP: [name], MD   Assessment & Plan: Visit Diagnoses:  1. Left tennis elbow     Plan: [name] was seen in March for evaluation of left elbow pain.  X-rays of the time were negative.  He notes that the pain eventually resolved but but has had some recurrent pain recently was seen by Dr. Jannifer Franklin in neurology.  He ordered EMGs and nerve conduction studies yesterday that demonstrated mild carpal tunnel in the left upper extremity but no evidence of ulnar nerve compression.  His pain is been localized along the lateral aspect of the elbow and is consistent with tennis elbow.  From a diagnostic and therapeutic standpoint I am going to inject the lateral epicondyle with Xylocaine and betamethasone and monitor his response.  Consider MRI scan if no improvement.  No evidence of ulnar neuropathy on exam today  Follow-Up Instructions: Return if symptoms worsen or fail to improve.   Orders:  Orders Placed This Encounter  Procedures  . Hand/UE Inj: L elbow   No orders of the defined types were placed in this encounter.     Procedures: Hand/UE Inj: L elbow for lateral epicondylitis on 07/10/2020 2:34 PM Medications: 1 mL lidocaine 1 %  1 mL dexamethasone      Clinical Data: No additional findings.   Subjective: Chief Complaint  Patient presents with  . Left Elbow - Pain  Patient presents today with persistent left elbow pain. He was last here in March of this year. He had an EMG study with Dr.Willis yesterday at [facility]. He said that his elbow is worsening. He has pain all around his elbow. No known injury. He is right hand dominant. He stopped taking all pain medications last month, and currently not taking anything.  Having difficulty  with grip particularly in extension.  Appears to localize the discomfort to the lateral epicondyle.  Occasionally pain will be referred along the extensor surface of the forearm.  Films of the left forearm in March were negative for any obvious pathology HPI  Review of Systems   Objective: Vital Signs: Ht 6' (1.829 m)   Wt 218 lb (98.9 kg)   BMI 29.57 kg/m   Physical Exam Constitutional:      Appearance: He is well-developed.  Eyes:     Pupils: Pupils are equal, round, and reactive to light.  Pulmonary:     Effort: Pulmonary effort is normal.  Skin:    General: Skin is warm and dry.  Neurological:     Mental Status: He is alert and oriented to person, place, and time.  Psychiatric:        Behavior: Behavior normal.     Ortho Exam awake alert and oriented x3.  Comfortable sitting.  Local tenderness over the lateral epicondyles left elbow.  Some pain with grip both in flexion and more and extension over the lateral epicondyles.  No pain along the medial epicondyle or the ulnar nerve.  Skin intact.  Full range of motion of the elbow in pronation supination flexion and extension Specialty Comments:  No specialty comments available.  Imaging: NCV with EMG(electromyography)  Result Date: 07/09/2020 Kathrynn Ducking, MD     07/09/2020  3:12 PM HISTORY: Quashawn Jewkes is a 49 year old patient with history of left elbow discomfort that has been present for several weeks.  He denies any significant neck pain, at times the pain goes down the forearm into the wrist.  He is being evaluated for a possible neuropathy or a cervical radiculopathy. NERVE CONDUCTION STUDIES: Nerve conduction studies were performed on both upper extremities. The distal motor latencies and motor amplitudes for the median and ulnar nerves were within normal limits, with exception of a minimal prolongation of the distal motor latency of the left median nerve. The nerve conduction velocities for these nerves were also  normal. The sensory latencies for the median and ulnar nerves were normal. The F wave latencies for the ulnar nerves were within normal limits. EMG STUDIES: EMG study was performed on the left upper extremity: The first dorsal interosseous muscle reveals 2 to 4 K units with full recruitment. No fibrillations or positive waves were noted. The abductor pollicis brevis muscle reveals 2 to 4 K units with full recruitment. No fibrillations or positive waves were noted. The extensor indicis proprius muscle reveals 1 to 3 K units with full recruitment. No fibrillations or positive waves were noted. The pronator teres muscle reveals 2 to 3 K units with full recruitment. No fibrillations or positive waves were noted. The biceps muscle reveals 1 to 2 K units with full recruitment. No fibrillations or positive waves were noted. The triceps muscle reveals 2 to 4 K units with full recruitment. No fibrillations or positive waves were noted. The anterior deltoid muscle reveals 2 to 3 K units with full recruitment. No fibrillations or positive waves were noted. The cervical paraspinal muscles were tested at 2 levels. No abnormalities of insertional activity were seen at either level tested. There was good relaxation. IMPRESSION: Nerve conduction studies done on both upper extremities were relatively unremarkable with exception of a very minimal prolongation of the distal motor latency left median nerve.  A borderline carpal tunnel syndrome may be present.  EMG evaluation of the left upper extremity was unremarkable, no evidence of an overlying cervical radiculopathy was seen. Jill Alexanders MD 07/09/2020 3:07 PM [facility] [address] [place], [place] [address] Phone [phone] Fax [phone]     PMFS History: Patient Active Problem List   Diagnosis Date Noted  . Left tennis elbow 07/10/2020  . Weight loss 06/10/2020  . Vitamin D deficiency 04/11/2020  . Gastroesophageal reflux  disease without esophagitis 04/11/2020  . Anxiety 04/11/2020  . Pain in left wrist 12/13/2019  . Pain in left elbow 12/13/2019  . Neuropathy 04/12/2019  . Low vitamin B12 level 02/20/2019  . Leukocytosis 02/12/2019  . Right-sided chest pain 02/12/2019  . Neck pain 12/07/2018  . HA (headache)   . Pain in soft tissues of limb   . Lumbago   . Gynecomastia, male 07/12/2013   Past Medical History:  Diagnosis Date  . Arthritis    back, left wrist, left knee  . Complication of anesthesia    bp dropped during gallbladder surgery in 2008  . Depression   . Family history of adverse reaction to anesthesia    mom stopped breathing during colonscopy  . GERD (gastroesophageal reflux disease)    h/o  . HA (headache)    h/o migrianes  . Herniated disc, cervical   . History of kidney stones    h/o stones  . History  of leukocytosis   . Lumbago   . Pain in soft tissues of limb   . Peripheral neuropathy 04/12/2019  . PTSD (post-traumatic stress disorder)   . Scoliosis     Family History  Problem Relation Age of Onset  . Cancer Father   . Heart Problems Father   . Lung cancer Father   . Colon cancer Mother   . Liver cancer Brother     Past Surgical History:  Procedure Laterality Date  . CHOLECYSTECTOMY  2008  . COLONOSCOPY  2010  . COLONOSCOPY WITH PROPOFOL N/A 06/09/2016   Procedure: COLONOSCOPY WITH PROPOFOL;  Surgeon: Christene Lye, MD;  Location: [facility];  Service: Endoscopy;  Laterality: N/A;  . FISSURECTOMY N/A 06/29/2016   Procedure: FISSURECTOMY;  Surgeon: Christene Lye, MD;  Location: [facility];  Service: General;  Laterality: N/A;  . HEMORRHOID SURGERY N/A 06/29/2016   Procedure: HEMORRHOIDECTOMY;  Surgeon: Christene Lye, MD;  Location: [facility];  Service: General;  Laterality: N/A;  . PAROTIDECTOMY Left 1997  . SALIVARY GLAND SURGERY  1997  . SPHINCTEROTOMY N/A 06/29/2016   Procedure: SPHINCTEROTOMY;  Surgeon: Christene Lye, MD;   Location: [facility];  Service: General;  Laterality: N/A;  . WRIST ARTHROSCOPY Left 01/26/2017   Procedure: ARTHROSCOPY LEFT WRIST DEBRIDEMENT;  Surgeon: Daryll Brod, MD;  Location: [facility];  Service: Orthopedics;  Laterality: Left;  . WRIST SURGERY  2019   Social History   Occupational History    Comment: Disabled  Tobacco Use  . Smoking status: Current Every Day Smoker    Packs/day: 0.50    Years: 32.00    Pack years: 16.00    Types: Cigarettes    Last attempt to quit: 01/16/2019    Years since quitting: 1.4  . Smokeless tobacco: Former Systems developer  . Tobacco comment: Quit 2020 and restarted 10/2019 1-2 cig/day  Vaping Use  . Vaping Use: Never used  Substance and Sexual Activity  . Alcohol use: No    Alcohol/week: 0.0 standard drinks  . Drug use: No  . Sexual activity: Not on file

## 2020-07-21 ENCOUNTER — Encounter: Payer: Self-pay | Admitting: Hematology and Oncology

## 2020-07-22 NOTE — Progress Notes (Signed)
[facility]  [address] [place], [place] [address] Phone: [phone]  Fax: [phone]   Clinic Day:  07/23/2020  Referring physician: [name], MD  Chief Complaint: Adam Holloway is a 49 y.o. male with leukocytosis and weight loss who is seen for 6 week assessment.  HPI: The patient was last seen in the hematology clinic on 06/10/2020. At that time, he had a constellation of symptoms felt possibly related to discontinuation of pain medications.   The patient saw Dr. Jannifer Franklin on 06/11/2020. His neurological exam was normal. 14-day cardiac monitor revealed a min HR of 50 bpm, max HR of 160 bpm. The predominant underlying rhythm was sinus rhythm. Patient had episode of atrial flutter on 06/18/20 lasting ~ 12 minutes; average rate 80 bpm. Isolated PACs were rare (<1.0%). Isolated PVCs were rare (<1.0%). There were trigger events with associated with sinus rhythm or sinus tachycardia.  Brain MRI without contrast on 06/23/2020 revealed few scattered T2/FLAIR hyperintense foci in the subcortical deep white matter of the hemispheres.  This was a nonspecific finding but most likely represents very minimal chronic microvascular ischemic change.  None of the foci appeared to be acute.  The patient reached out via MyChart saying that he had labs done with his pain specialist. His WBC was 20,300 and platelets 94,000 on 07/12/2020.  He was not feeling any better.  During the interim, he notes that he is been "freaking out" secondary to his health.  He denies any fevers, sweats or weight loss.  He has gained 6 pounds.  He states that he added sugar back to his diet.  He notes some sinus drainage.  He has migraines; he has had numbness in the back of his head and a neuropathy in his left foot.  He notes that his arms feel heavy and sometimes he has tightness across his abdomen.  He has had issues with atrial flutter.  He notes some anxiety and stress.  He feels way  down in the morning.  He has had no interval steroids.  He is scheduled for an injection in his left elbow on 08/10/2020.   Past Medical History:  Diagnosis Date  . Arthritis    back, left wrist, left knee  . Complication of anesthesia    bp dropped during gallbladder surgery in 2008  . Depression   . Family history of adverse reaction to anesthesia    mom stopped breathing during colonscopy  . GERD (gastroesophageal reflux disease)    h/o  . HA (headache)    h/o migrianes  . Herniated disc, cervical   . History of kidney stones    h/o stones  . History of leukocytosis   . Lumbago   . Pain in soft tissues of limb   . Pancreatitis   . Peripheral neuropathy 04/12/2019  . PTSD (post-traumatic stress disorder)   . Scoliosis     Past Surgical History:  Procedure Laterality Date  . CHOLECYSTECTOMY  2008  . COLONOSCOPY  2010  . COLONOSCOPY WITH PROPOFOL N/A 06/09/2016   Procedure: COLONOSCOPY WITH PROPOFOL;  Surgeon: Christene Lye, MD;  Location: [facility];  Service: Endoscopy;  Laterality: N/A;  . FISSURECTOMY N/A 06/29/2016   Procedure: FISSURECTOMY;  Surgeon: Christene Lye, MD;  Location: [facility] OR[facility];  Service: General;  Laterality: N/A;  . HEMORRHOID SURGERY N/A 06/29/2016   Procedure: HEMORRHOIDECTOMY;  Surgeon: Christene Lye, MD;  Location: [facility] ORS;  Service: General;  Laterality: N/A;  . PAROTIDECTOMY  Left 1997  . SALIVARY GLAND SURGERY  1997  . SPHINCTEROTOMY N/A 06/29/2016   Procedure: SPHINCTEROTOMY;  Surgeon: Christene Lye, MD;  Location: [facility] ORS;  Service: General;  Laterality: N/A;  . WRIST ARTHROSCOPY Left 01/26/2017   Procedure: ARTHROSCOPY LEFT WRIST DEBRIDEMENT;  Surgeon: Daryll Brod, MD;  Location: [facility];  Service: Orthopedics;  Laterality: Left;  . WRIST SURGERY  2019    Family History  Problem Relation Age of Onset  . Cancer Father   . Heart Problems Father   . Lung cancer Father   . Colon cancer  Mother   . Liver cancer Brother     Social History:  reports that he has been smoking cigarettes. He has a 16.00 pack-year smoking history. He has quit using smokeless tobacco. He reports that he does not drink alcohol and does not use drugs. He does not drink alcohol, and stopped in 2006. He smoked a pack a day for 30 years until 01/16/2019. He is currently smoking 1-2 cigarettes daily. He denies any known exposure to radiation or toxins. He does not currently work, but previously worked in Psychologist, educational and for a Engineer, civil (consulting). He is on disability for his back issues and PTSD. The patient is alone today.  Allergies:  Allergies  Allergen Reactions  . Augmentin [Amoxicillin-Pot Clavulanate] Anaphylaxis  . Hydrocodone-Acetaminophen Shortness Of Breath    Pt states he is not allergic to Hydrocodone and would like it to be removed from his chart.  . Nsaids Other (See Comments)    Chest pain  . Tolmetin Other (See Comments)    Chest pain  . Meloxicam Other (See Comments)    Chest pain    Current Medications: Current Outpatient Medications  Medication Sig Dispense Refill  . VITAMIN D PO Take by mouth. Patient states he takes sometimes    . atorvastatin (LIPITOR) 20 MG tablet Take 1 tablet (20 mg total) by mouth daily. 30 tablet 5  . metoprolol tartrate (LOPRESSOR) 100 MG tablet Take 1 tablet (100 mg total) by mouth once for 1 dose. Take 2 hours prior to your CT scan. 1 tablet 0   No current facility-administered medications for this visit.    Review of Systems  Constitutional: Negative for chills, diaphoresis, fever, malaise/fatigue and weight loss (up 6 pounds).       Concerned about health.  HENT: Negative for ear discharge, ear pain, hearing loss, nosebleeds, sinus pain and sore throat.        Sinus drainage.  Throat discomfort.  Eyes: Negative for blurred vision, double vision, photophobia and pain.       Poor vision.  Respiratory: Negative.  Negative for cough, hemoptysis,  sputum production and shortness of breath.   Cardiovascular: Positive for palpitations. Negative for chest pain, leg swelling and PND.       Episodes of atrial flutter.  Gastrointestinal: Negative.  Negative for abdominal pain, blood in stool, constipation, diarrhea, heartburn, nausea and vomiting.  Genitourinary: Negative.  Negative for dysuria, frequency, hematuria and urgency.  Musculoskeletal: Negative for joint pain, myalgias and neck pain.  Skin: Negative.  Negative for itching and rash.  Neurological: Positive for sensory change (neuropathy left foot; back of head numb) and headaches (migraines). Negative for dizziness, tremors, speech change, focal weakness and weakness.  Endo/Heme/Allergies: Negative.  Does not bruise/bleed easily.  Psychiatric/Behavioral: Negative for depression and memory loss. The patient is nervous/anxious (stress). The patient does not have insomnia.   All other systems reviewed and are  negative.   Performance status (ECOG):  2  Vitals: Blood pressure 133/83, pulse 85, temperature (!) 96.3 F (35.7 C), temperature source Tympanic, resp. rate 18, weight 224 lb 13.9 oz (102 kg), SpO2 100 %.  Physical Exam Vitals and nursing note reviewed.  Constitutional:      General: He is not in acute distress.    Appearance: Normal appearance. He is well-developed. He is not ill-appearing, toxic-appearing or diaphoretic.  HENT:     Head: Normocephalic and atraumatic.     Comments: Short brown hair.    Mouth/Throat:     Mouth: Mucous membranes are moist.     Pharynx: Oropharynx is clear.  Eyes:     General: No scleral icterus.    Conjunctiva/sclera: Conjunctivae normal.     Pupils: Pupils are equal, round, and reactive to light.     Comments: Blue eyes.  Cardiovascular:     Rate and Rhythm: Normal rate and regular rhythm.     Heart sounds: Normal heart sounds. No murmur heard.   Pulmonary:     Effort: Pulmonary effort is normal. No respiratory distress.      Breath sounds: Normal breath sounds. No wheezing or rales.  Chest:     Chest wall: No tenderness.  Abdominal:     General: Bowel sounds are normal. There is no distension.     Palpations: Abdomen is soft. There is no hepatomegaly, splenomegaly or mass.     Tenderness: There is no abdominal tenderness. There is no guarding or rebound.  Musculoskeletal:        General: No swelling. Normal range of motion.     Cervical back: Normal range of motion and neck supple. No rigidity or tenderness.  Lymphadenopathy:     Head:     Right side of head: No preauricular, posterior auricular or occipital adenopathy.     Left side of head: No preauricular, posterior auricular or occipital adenopathy.     Cervical: No cervical adenopathy.     Upper Body:     Right upper body: No supraclavicular or axillary adenopathy.     Left upper body: No supraclavicular or axillary adenopathy.     Lower Body: No right inguinal adenopathy. No left inguinal adenopathy.  Skin:    General: Skin is warm.     Coloration: Skin is not jaundiced or pale.     Findings: No bruising or erythema.  Neurological:     Mental Status: He is alert and oriented to person, place, and time. Mental status is at baseline.  Psychiatric:        Mood and Affect: Mood normal.        Behavior: Behavior normal.        Thought Content: Thought content normal.        Judgment: Judgment normal.    Office Visit on 07/23/2020  Component Date Value Ref Range Status  . Specimen Type 07/23/2020 BLOOD   Final  . Cells Counted 07/23/2020 200   Final  . Cells Analyzed 07/23/2020 200   Final  . FISH Result 07/23/2020 Comment:   Final   NORMAL:  NO BCR OR ABL1 GENE REARRANGEMENT OBSERVED  . Interpretation 07/23/2020 Comment:   Final   Comment: (NOTE)             nuc ish 9q34(ASS1,ABL1)x2,22q11.2(BCRx2)[200].      The fluorescence in situ hybridization (FISH) study was normal. FISH, using unique sequence DNA probes for the ABL1 and BCR gene  regions showed two ABL1  signals (red), two control ASS1 gene signals (aqua) located adjacent to the ABL1 locus at 9q34, and two BCR signals (green) at 22q11.2 in all interphase nuclei examined. There was NO evidence of CML or ALL-associated BCR/ABL1 dual fusion signals in this analysis. .      This analysis is limited to abnormalities detectable by the specific probes included in the study. FISH results should be interpreted within the context of a full cytogenetic analysis and pathology evaluation. .      This test was developed and its performance characteristics determined by [employer]). It has not been cleared or approved by the U.S. Food and Drug Administration. A BCR-ABL1 gene fusion in greater than 3 interphase nuclei in a                           patient with a new clinical diagnosis is considered positive. The DNA probe vendor for this study was [employer] research (physical sciences)).   . Director Review: 07/23/2020 Comment:   Final   Comment: (NOTE) Rachael Darby, PhD., [facility] Performed At: [facility] [address] [place], [place] [id number] Theresa Duty MD [id number] Performed At: [facility] [place], [place] [id number] Katina Degree MDPhD [id number]   . Path Review 07/23/2020 Blood smear is reviewed.   Final   Comment: Absolute leukocytosis, with unremarkable differential and morphology. No circulating blasts identified, and no significant left shift in maturation. Normal RBC parameters and morphology. Normal platelet count and morphology. The cause of the patients leukocytosis is unclear from morphologic review alone. Potential secondary causes of leukocytosis should be considered, including but not limited to infection, medication/toxin reaction, exercise, stress, and chronic inflammatory conditions. Clinical correlation is recommended. Reviewed by Kathi Simpers, M.D. Performed  at [facility], [address], [place], [place] [address]   . Sodium 07/23/2020 140  135 - 145 mmol/L Final  . Potassium 07/23/2020 4.2  3.5 - 5.1 mmol/L Final  . Chloride 07/23/2020 103  98 - 111 mmol/L Final  . CO2 07/23/2020 25  22 - 32 mmol/L Final  . Glucose, Bld 07/23/2020 88  70 - 99 mg/dL Final   Glucose reference range applies only to samples taken after fasting for at least 8 hours.  . BUN 07/23/2020 14  6 - 20 mg/dL Final  . Creatinine, Ser 07/23/2020 1.01  0.61 - 1.24 mg/dL Final  . Calcium 07/23/2020 9.1  8.9 - 10.3 mg/dL Final  . Total Protein 07/23/2020 7.4  6.5 - 8.1 g/dL Final  . Albumin 07/23/2020 4.2  3.5 - 5.0 g/dL Final  . AST 07/23/2020 20  15 - 41 U/L Final  . ALT 07/23/2020 27  0 - 44 U/L Final  . Alkaline Phosphatase 07/23/2020 76  38 - 126 U/L Final  . Total Bilirubin 07/23/2020 0.4  0.3 - 1.2 mg/dL Final  . GFR, Estimated 07/23/2020 >60  >60 mL/min Final   Comment: (NOTE) Calculated using the CKD-EPI Creatinine Equation (2021)   . Anion gap 07/23/2020 12  5 - 15 Final   Performed at [facility], [address] [place], [place] [address]  . WBC 07/23/2020 11.3* 4.0 - 10.5 K/uL Final   WHITE COUNT CONFIRMED ON SMEAR  . RBC 07/23/2020 4.97  4.22 - 5.81 MIL/uL Final  . Hemoglobin 07/23/2020 14.9  13.0 - 17.0 g/dL Final  . HCT 07/23/2020 44.9  39 - 52 % Final  . MCV 07/23/2020 90.3  80.0 - 100.0 fL Final  . MCH 07/23/2020 30.0  26.0 - 34.0 pg Final  . MCHC 07/23/2020 33.2  30.0 - 36.0 g/dL Final  . RDW 07/23/2020 15.4  11.5 - 15.5 % Final  . Platelets 07/23/2020 170  150 - 400 K/uL Final  . nRBC 07/23/2020 0.0  0.0 - 0.2 % Final  . Neutrophils Relative % 07/23/2020 68  % Final  . Neutro Abs 07/23/2020 7.6  1.7 - 7.7 K/uL Final  . Lymphocytes Relative 07/23/2020 25  % Final  . Lymphs Abs 07/23/2020 2.9  0.7 - 4.0 K/uL Final  . Monocytes Relative 07/23/2020 6  % Final  . Monocytes Absolute 07/23/2020 0.6  0.1 - 1.0 K/uL Final  .  Eosinophils Relative 07/23/2020 1  % Final  . Eosinophils Absolute 07/23/2020 0.1  0.0 - 0.5 K/uL Final  . Basophils Relative 07/23/2020 0  % Final  . Basophils Absolute 07/23/2020 0.0  0.0 - 0.1 K/uL Final  . Immature Granulocytes 07/23/2020 0  % Final  . Abs Immature Granulocytes 07/23/2020 0.03  0.00 - 0.07 K/uL Final   Performed at [facility], [address] [place],  [address]    Assessment:  WREN GALLAGA is a 49 y.o. male with mild leukocytosis felt reactive.  WBC has ranged between 11,300 - 16,700 in the past 2 months.  Work-up on 01/19/2019 revealed the following normal labs:  hepatitis, hepatitis B core total antibody, hepatitis C antibody, HIV testing, Burgdorferi antibodies, ANA, ACE, rheumatoid factor, RPR, sed rate, amylase, and lipase.  ALT was 48 (0-44).  B12 was 341 (low normal).   Work-up on 02/08/2019 revealed a hematocrit of 46.3, hemoglobin 15.5, MCV 92.6, platelets 195,000, WBC 10,700 with an ANC of 7100.  Differential was unremarkable.  CRP was < 0.8 (normal).  BCR-ABL was negative.    Peripheral smear revealed persistent mild leukocytosis. There was mild non specific leukocytosis with mature neutrophils and lymphocytes. There was occasional reactive appearing lymphocytes. There was no shift in immaturity. RBC morphology was unremarkable.  Platelets appeared mildly increased in size.  Overall the morphologic findings were not specific and are likely reactive to another process. A hematologic neoplastic process was not favored.   He has a 30 pack year smoking history.  Chest CT on 02/15/2019 revealed no acute cardiopulmonary disease.  There were scattered coronary artery calcifications, mild bilateral gynecomastia, and aortic atherosclerosis.  PET scan on 06/06/2020 revealed no significant hypermetabolic lesion is identified to suggest hypermetabolic malignancy in the setting of the patient's unintentional weight loss.  Symptomatically, he has a  multitude of concerns.  He has some nasal drainage.  He denies recent steroids.  Exam is stable.  WBC is 11,300.  Plan: 1.   Labs today: CBC with diff, CMP, BCR-ABL. 2.   Peripheral smear for path review. 3.   Mild leukocytosis  WBC remains slightly elevated with an unremarkable differential.  He appears to have reactive leukocytosis.    He has had a constellation of symptoms with a negative PET scan.  Reassurance provided. 4.   Weight loss, resolved  He has gained 6 pounds.  Weight loss may have been secondary to diet and stress. 5.   RTC ion 11/11/2020 as previously scheduled.  I discussed the assessment and treatment plan with the patient.  The patient was provided an opportunity to ask questions and all were answered.  The patient agreed with the  plan and demonstrated an understanding of the instructions.  The patient was advised to call back or seek an in person evaluation if the symptoms worsen or if the condition fails to improve as anticipated.   Nolon Stalls, MD, PhD  07/23/2020, 5:00 PM

## 2020-07-23 ENCOUNTER — Other Ambulatory Visit: Payer: Self-pay

## 2020-07-23 ENCOUNTER — Encounter: Payer: Self-pay | Admitting: Hematology and Oncology

## 2020-07-23 ENCOUNTER — Inpatient Hospital Stay: Payer: Medicare Other | Attending: Hematology and Oncology | Admitting: Hematology and Oncology

## 2020-07-23 ENCOUNTER — Ambulatory Visit: Payer: Medicare Other | Admitting: Neurology

## 2020-07-23 VITALS — BP 133/83 | HR 85 | Temp 96.3°F | Resp 18 | Wt 224.9 lb

## 2020-07-23 DIAGNOSIS — Z800 Family history of malignant neoplasm of digestive organs: Secondary | ICD-10-CM | POA: Diagnosis not present

## 2020-07-23 DIAGNOSIS — D72829 Elevated white blood cell count, unspecified: Secondary | ICD-10-CM | POA: Diagnosis not present

## 2020-07-23 DIAGNOSIS — I2510 Atherosclerotic heart disease of native coronary artery without angina pectoris: Secondary | ICD-10-CM | POA: Diagnosis not present

## 2020-07-23 DIAGNOSIS — F4310 Post-traumatic stress disorder, unspecified: Secondary | ICD-10-CM | POA: Insufficient documentation

## 2020-07-23 DIAGNOSIS — R519 Headache, unspecified: Secondary | ICD-10-CM | POA: Insufficient documentation

## 2020-07-23 DIAGNOSIS — Z801 Family history of malignant neoplasm of trachea, bronchus and lung: Secondary | ICD-10-CM | POA: Insufficient documentation

## 2020-07-23 DIAGNOSIS — K219 Gastro-esophageal reflux disease without esophagitis: Secondary | ICD-10-CM | POA: Diagnosis not present

## 2020-07-23 DIAGNOSIS — I209 Angina pectoris, unspecified: Secondary | ICD-10-CM | POA: Diagnosis not present

## 2020-07-23 DIAGNOSIS — F17210 Nicotine dependence, cigarettes, uncomplicated: Secondary | ICD-10-CM | POA: Insufficient documentation

## 2020-07-23 DIAGNOSIS — Z79899 Other long term (current) drug therapy: Secondary | ICD-10-CM | POA: Insufficient documentation

## 2020-07-23 LAB — COMPREHENSIVE METABOLIC PANEL
ALT: 27 U/L (ref 0–44)
AST: 20 U/L (ref 15–41)
Albumin: 4.2 g/dL (ref 3.5–5.0)
Alkaline Phosphatase: 76 U/L (ref 38–126)
Anion gap: 12 (ref 5–15)
BUN: 14 mg/dL (ref 6–20)
CO2: 25 mmol/L (ref 22–32)
Calcium: 9.1 mg/dL (ref 8.9–10.3)
Chloride: 103 mmol/L (ref 98–111)
Creatinine, Ser: 1.01 mg/dL (ref 0.61–1.24)
GFR, Estimated: >60 mL/min (ref >60)
Glucose, Bld: 88 mg/dL (ref 70–99)
Potassium: 4.2 mmol/L (ref 3.5–5.1)
Sodium: 140 mmol/L (ref 135–145)
Total Bilirubin: 0.4 mg/dL (ref 0.3–1.2)
Total Protein: 7.4 g/dL (ref 6.5–8.1)

## 2020-07-23 LAB — CBC WITH DIFFERENTIAL/PLATELET
Abs Immature Granulocytes: 0.03 10*3/uL (ref 0.00–0.07)
Basophils Absolute: 0 10*3/uL (ref 0.0–0.1)
Basophils Relative: 0 %
Eosinophils Absolute: 0.1 10*3/uL (ref 0.0–0.5)
Eosinophils Relative: 1 %
HCT: 44.9 % (ref 39.0–52.0)
Hemoglobin: 14.9 g/dL (ref 13.0–17.0)
Immature Granulocytes: 0 %
Lymphocytes Relative: 25 %
Lymphs Abs: 2.9 10*3/uL (ref 0.7–4.0)
MCH: 30 pg (ref 26.0–34.0)
MCHC: 33.2 g/dL (ref 30.0–36.0)
MCV: 90.3 fL (ref 80.0–100.0)
Monocytes Absolute: 0.6 10*3/uL (ref 0.1–1.0)
Monocytes Relative: 6 %
Neutro Abs: 7.6 10*3/uL (ref 1.7–7.7)
Neutrophils Relative %: 68 %
Platelets: 170 10*3/uL (ref 150–400)
RBC: 4.97 MIL/uL (ref 4.22–5.81)
RDW: 15.4 % (ref 11.5–15.5)
WBC: 11.3 10*3/uL — ABNORMAL HIGH (ref 4.0–10.5)
nRBC: 0 % (ref 0.0–0.2)

## 2020-07-23 LAB — PATHOLOGIST SMEAR REVIEW

## 2020-07-23 NOTE — Progress Notes (Signed)
Was having migraine and back of head numbness and in left side of body. Throat pain and squeezing feeling across abdomen. Wore heart monitor and had some afib, goes to heart doctor Thursday. Anxiety and depression. Left ribcage pain.

## 2020-07-25 ENCOUNTER — Other Ambulatory Visit: Payer: Self-pay

## 2020-07-25 ENCOUNTER — Ambulatory Visit (INDEPENDENT_AMBULATORY_CARE_PROVIDER_SITE_OTHER): Payer: Medicare Other | Admitting: Cardiology

## 2020-07-25 ENCOUNTER — Encounter: Payer: Self-pay | Admitting: Cardiology

## 2020-07-25 VITALS — BP 118/74 | HR 95 | Ht 72.0 in | Wt 223.0 lb

## 2020-07-25 DIAGNOSIS — R0609 Other forms of dyspnea: Secondary | ICD-10-CM

## 2020-07-25 DIAGNOSIS — R072 Precordial pain: Secondary | ICD-10-CM

## 2020-07-25 DIAGNOSIS — I4892 Unspecified atrial flutter: Secondary | ICD-10-CM

## 2020-07-25 DIAGNOSIS — R079 Chest pain, unspecified: Secondary | ICD-10-CM

## 2020-07-25 DIAGNOSIS — R0600 Dyspnea, unspecified: Secondary | ICD-10-CM | POA: Diagnosis not present

## 2020-07-25 DIAGNOSIS — F17200 Nicotine dependence, unspecified, uncomplicated: Secondary | ICD-10-CM

## 2020-07-25 DIAGNOSIS — R931 Abnormal findings on diagnostic imaging of heart and coronary circulation: Secondary | ICD-10-CM

## 2020-07-25 DIAGNOSIS — I209 Angina pectoris, unspecified: Secondary | ICD-10-CM

## 2020-07-25 MED ORDER — METOPROLOL TARTRATE 100 MG PO TABS: 100.0000 mg | ORAL_TABLET | Freq: Once | ORAL | 0 refills | Status: DC

## 2020-07-25 MED ORDER — IVABRADINE HCL 5 MG PO TABS: 10.0000 mg | ORAL_TABLET | Freq: Once | ORAL | 0 refills | Status: AC

## 2020-07-25 NOTE — Patient Instructions (Signed)
Medication Instructions:   Your physician recommends that you continue on your current medications as directed. Please refer to the Current Medication list given to you today.  *If you need a refill on your cardiac medications before your next appointment, please call your pharmacy*   Lab Work: None Ordered If you have labs (blood work) drawn today and your tests are completely normal, you will receive your results only by: Marland Kitchen MyChart Message (if you have MyChart) OR . A paper copy in the mail If you have any lab test that is abnormal or we need to change your treatment, we will call you to review the results.   Testing/Procedures:  1.  Your physician has requested that you have an echocardiogram. Echocardiography is a painless test that uses sound waves to create images of your heart. It provides your doctor with information about the size and shape of your heart and how well your heart's chambers and valves are working. This procedure takes approximately one hour. There are no restrictions for this procedure.  2.  Your physician has requested that you have cardiac CT. Cardiac computed tomography (CT) is a painless test that uses an x-ray machine to take clear, detailed pictures of your heart.   Your cardiac CT will be scheduled at:  [facility] [address] [place], [place] [address] [phone]  Please arrive 15 mins early for check-in and test prep.    Please follow these instructions carefully (unless otherwise directed):  Hold all erectile dysfunction medications at least 3 days (72 hrs) prior to test.  On the Night Before the Test: . Be sure to Drink plenty of water. . Do not consume any caffeinated/decaffeinated beverages or chocolate 12 hours prior to your test. . Do not take any antihistamines 12 hours prior to your test.   On the Day of the Test: . Drink plenty of water. Do not drink any water within one hour of the  test. . Do not eat any food 4 hours prior to the test. . You may take your regular medications prior to the test.  . Take metoprolol (Lopressor) two hours prior to test. 1 pill total (156m) . Take Ivabradine (Corlanor) two hours prior to test.  2 pills total (18m          After the Test: . Drink plenty of water. . After receiving IV contrast, you may experience a mild flushed feeling. This is normal. . On occasion, you may experience a mild rash up to 24 hours after the test. This is not dangerous. If this occurs, you can take Benadryl 25 mg and increase your fluid intake. . If you experience trouble breathing, this can be serious. If it is severe call 911 IMMEDIATELY. If it is mild, please call our office. . If you take any of these medications: Glipizide/Metformin, Avandament, Glucavance, please do not take 48 hours after completing test unless otherwise instructed.   Once we have confirmed authorization from your insurance company, we will call you to set up a date and time for your test. Based on how quickly your insurance processes prior authorizations requests, please allow up to 4 weeks to be contacted for scheduling your Cardiac CT appointment. Be advised that routine Cardiac CT appointments could be scheduled as many as 8 weeks after your provider has ordered it.  For non-scheduling related questions, please contact the cardiac imaging nurse navigator should you have any questions/concerns: SaMarchia BondCardiac Imaging Nurse Navigator MeKerin Ransom  Tai, Interim Cardiac Imaging Nurse Navigator [facility] Direct Office Dial: [phone]   For scheduling needs, including cancellations and rescheduling, please call [place], [phone] (temporary number).       Follow-Up: At [facility], you and your health needs are our priority.  As part of our continuing mission to provide you with exceptional heart care, we have created designated Provider Care  Teams.  These Care Teams include your primary Cardiologist (physician) and Advanced Practice Providers (APPs -  Physician Assistants and Nurse Practitioners) who all work together to provide you with the care you need, when you need it.  We recommend signing up for the patient portal called "MyChart".  Sign up information is provided on this After Visit Summary.  MyChart is used to connect with patients for Virtual Visits (Telemedicine).  Patients are able to view lab/test results, encounter notes, upcoming appointments, etc.  Non-urgent messages can be sent to your provider as well.   To learn more about what you can do with MyChart, go to [email or web address].    Your next appointment:   5 week(s)  The format for your next appointment:   In Person  Provider:   Kate Sable, MD   Other Instructions

## 2020-07-25 NOTE — Progress Notes (Addendum)
Cardiology Office Note:    Date:  07/25/2020   ID:  Adam Holloway, DOB August 06, 1971, MRN [id number]  PCP:  [name], MD  [facility] Cardiologist:  Kate Sable, MD  [place] Electrophysiologist:  None   Referring MD: Kathrynn Ducking, MD   Chief Complaint  Patient presents with  . New Patient (Initial Visit)    Referred by Neurology, Dr. Margette Fast, for Atrial Flutter  Pt states he feels occasional burning in his chest and chest pressure.   Adam Holloway is a 49 y.o. male who is being seen today for the evaluation of atrial flutter at the request of Kathrynn Ducking, MD.  History of Present Illness:    Adam Holloway is a 49 y.o. male with a hx of migraines, PTSD, GERD, current smoker x30+ years who presents due to atrial flutter.  Patient states having abdominal discomfort and nonspecific tingling in his arms.  Work-up so far has been unrevealing.  Cardiac monitor was placed last month on 06/2020 showing about 12 minutes of atrial flutter, less than 1% burden.  He has a history of elevated white counts, chest CT was ordered for work-up showing calcifications in his LAD.  He endorsed having shortness of breath and fatigue with exertion.  Denies overt chest pain, endorses occasional reflux which is being managed with antireflux medications by gastroenterology.  Denies any history of heart disease.  States recent cholesterol levels showed elevated LDL but this was nonfasting.  He plans to follow-up with primary care provider later this month for repeat lipid panel.  Past Medical History:  Diagnosis Date  . Arthritis    back, left wrist, left knee  . Complication of anesthesia    bp dropped during gallbladder surgery in 2008  . Depression   . Family history of adverse reaction to anesthesia    mom stopped breathing during colonscopy  . GERD (gastroesophageal reflux disease)    h/o  . HA (headache)    h/o migrianes  . Herniated disc, cervical   .  History of kidney stones    h/o stones  . History of leukocytosis   . Lumbago   . Pain in soft tissues of limb   . Peripheral neuropathy 04/12/2019  . PTSD (post-traumatic stress disorder)   . Scoliosis     Past Surgical History:  Procedure Laterality Date  . CHOLECYSTECTOMY  2008  . COLONOSCOPY  2010  . COLONOSCOPY WITH PROPOFOL N/A 06/09/2016   Procedure: COLONOSCOPY WITH PROPOFOL;  Surgeon: Christene Lye, MD;  Location: [facility] ENDOS[facility];  Service: Endoscopy;  Laterality: N/A;  . FISSURECTOMY N/A 06/29/2016   Procedure: FISSURECTOMY;  Surgeon: Christene Lye, MD;  Location: [facility] ORS;  Service: General;  Laterality: N/A;  . HEMORRHOID SURGERY N/A 06/29/2016   Procedure: HEMORRHOIDECTOMY;  Surgeon: Christene Lye, MD;  Location: [facility] ORS;  Service: General;  Laterality: N/A;  . PAROTIDECTOMY Left 1997  . SALIVARY GLAND SURGERY  1997  . SPHINCTEROTOMY N/A 06/29/2016   Procedure: SPHINCTEROTOMY;  Surgeon: Christene Lye, MD;  Location: [facility] ORS;  Service: General;  Laterality: N/A;  . WRIST ARTHROSCOPY Left 01/26/2017   Procedure: ARTHROSCOPY LEFT WRIST DEBRIDEMENT;  Surgeon: Daryll Brod, MD;  Location: [place];  Service: Orthopedics;  Laterality: Left;  . WRIST SURGERY  2019    Current Medications: Current Meds  Medication Sig  . VITAMIN D PO Take by mouth. Patient states he takes sometimes     Allergies:  Augmentin [amoxicillin-pot clavulanate], Hydrocodone-acetaminophen, Nsaids, Tolmetin, and Meloxicam   Social History   Socioeconomic History  . Marital status: Single    Spouse name: Not on file  . Number of children: 1  . Years of education: GED  . Highest education level: Not on file  Occupational History    Comment: Disabled  Tobacco Use  . Smoking status: Current Every Day Smoker    Packs/day: 0.50    Years: 32.00    Pack years: 16.00    Types: Cigarettes    Last attempt to quit: 01/16/2019    Years since quitting:  1.5  . Smokeless tobacco: Former Systems developer  . Tobacco comment: Quit 2020 and restarted 10/2019 1-2 cig/day  Vaping Use  . Vaping Use: Never used  Substance and Sexual Activity  . Alcohol use: No    Alcohol/week: 0.0 standard drinks  . Drug use: No  . Sexual activity: Not on file  Other Topics Concern  . Not on file  Social History Narrative   Patient lives at home with his mother.   Disabled.   Education GED.   Right handed.   Caffeine two cups of coffee daily.   Social Determinants of Health   Financial Resource Strain:   . Difficulty of Paying Living Expenses: Not on file  Food Insecurity:   . Worried About Charity fundraiser in the Last Year: Not on file  . Ran Out of Food in the Last Year: Not on file  Transportation Needs:   . Lack of Transportation (Medical): Not on file  . Lack of Transportation (Non-Medical): Not on file  Physical Activity:   . Days of Exercise per Week: Not on file  . Minutes of Exercise per Session: Not on file  Stress:   . Feeling of Stress : Not on file  Social Connections:   . Frequency of Communication with Friends and Family: Not on file  . Frequency of Social Gatherings with Friends and Family: Not on file  . Attends Religious Services: Not on file  . Active Member of Clubs or Organizations: Not on file  . Attends Archivist Meetings: Not on file  . Marital Status: Not on file     Family History: The patient's family history includes Cancer in his father; Colon cancer in his mother; Heart Problems in his father; Liver cancer in his brother; Lung cancer in his father.  ROS:   Please see the history of present illness.     All other systems reviewed and are negative.  EKGs/Labs/Other Studies Reviewed:    The following studies were reviewed today:   EKG:  EKG is  ordered today.  The ekg ordered today demonstrates sinus rhythm, normal ECG.  Recent Labs: 07/23/2020: ALT 27; BUN 14; Creatinine, Ser 1.01; Hemoglobin 14.9;  Platelets 170; Potassium 4.2; Sodium 140  Recent Lipid Panel No results found for: CHOL, TRIG, HDL, CHOLHDL, VLDL, LDLCALC, LDLDIRECT   Risk Assessment/Calculations:      Physical Exam:    VS:  BP 118/74   Pulse 95   Ht 6' (1.829 m)   Wt 223 lb (101.2 kg)   BMI 30.24 kg/m     Wt Readings from Last 3 Encounters:  07/25/20 223 lb (101.2 kg)  07/23/20 224 lb 13.9 oz (102 kg)  07/10/20 218 lb (98.9 kg)     GEN:  Well nourished, well developed in no acute distress HEENT: Normal NECK: No JVD; No carotid bruits LYMPHATICS: No lymphadenopathy CARDIAC: RRR, no  murmurs, rubs, gallops RESPIRATORY:  Clear to auscultation without rales, wheezing or rhonchi  ABDOMEN: Soft, non-tender, non-distended MUSCULOSKELETAL:  No edema; No deformity  SKIN: Warm and dry NEUROLOGIC:  Alert and oriented x 3 PSYCHIATRIC:  Normal affect   ASSESSMENT:    1. Atrial flutter, unspecified type ([place])   2. Dyspnea on exertion   3. Smoking    PLAN:    In order of problems listed above:  1. Paroxysmal atrial flutter, CHA2DS2-VASc of 0.  Obtain echocardiogram.  Will not anticoagulate due to low CHA2DS2-VASc score. 2. Patient with dyspnea on exertion, get echo as above.  Risk factors of current smoker.  Calcifications noted in LAD on noncontrast CT scan. This is an angina equivalent. Get coronary CTA to quantify coronary calcium and evaluate obstructive CAD.  Recommend repeat fasting lipid profile when he follows up later this month with primary care provider. 3. Current smoker, cessation advised.  Follow-up after echo and coronary CTA.   Follow-up after echocardiogram.    Shared Decision Making/Informed Consent      Medication Adjustments/Labs and Tests Ordered: Current medicines are reviewed at length with the patient today.  Concerns regarding medicines are outlined above.  Orders Placed This Encounter  Procedures  . CT CORONARY MORPH W/CTA COR W/SCORE W/CA W/CM &/OR WO/CM  . CT CORONARY  FRACTIONAL FLOW RESERVE DATA PREP  . CT CORONARY FRACTIONAL FLOW RESERVE FLUID ANALYSIS  . EKG 12-Lead  . ECHOCARDIOGRAM COMPLETE   Meds ordered this encounter  Medications  . metoprolol tartrate (LOPRESSOR) 100 MG tablet    Sig: Take 1 tablet (100 mg total) by mouth once for 1 dose. Take 2 hours prior to your CT scan.    Dispense:  1 tablet    Refill:  0  . ivabradine (CORLANOR) 5 MG TABS tablet    Sig: Take 2 tablets (10 mg total) by mouth once for 1 dose. Take 2 hours prior to your CT scan.    Dispense:  2 tablet    Refill:  0    Patient Instructions  Medication Instructions:   Your physician recommends that you continue on your current medications as directed. Please refer to the Current Medication list given to you today.  *If you need a refill on your cardiac medications before your next appointment, please call your pharmacy*   Lab Work: None Ordered If you have labs (blood work) drawn today and your tests are completely normal, you will receive your results only by: Marland Kitchen MyChart Message (if you have MyChart) OR . A paper copy in the mail If you have any lab test that is abnormal or we need to change your treatment, we will call you to review the results.   Testing/Procedures:  1.  Your physician has requested that you have an echocardiogram. Echocardiography is a painless test that uses sound waves to create images of your heart. It provides your doctor with information about the size and shape of your heart and how well your heart's chambers and valves are working. This procedure takes approximately one hour. There are no restrictions for this procedure.  2.  Your physician has requested that you have cardiac CT. Cardiac computed tomography (CT) is a painless test that uses an x-ray machine to take clear, detailed pictures of your heart.   Your cardiac CT will be scheduled at:  [facility] [address] [place], [place]  [address] [phone]  Please arrive 15 mins early for check-in and  test prep.    Please follow these instructions carefully (unless otherwise directed):  Hold all erectile dysfunction medications at least 3 days (72 hrs) prior to test.  On the Night Before the Test: . Be sure to Drink plenty of water. . Do not consume any caffeinated/decaffeinated beverages or chocolate 12 hours prior to your test. . Do not take any antihistamines 12 hours prior to your test.   On the Day of the Test: . Drink plenty of water. Do not drink any water within one hour of the test. . Do not eat any food 4 hours prior to the test. . You may take your regular medications prior to the test.  . Take metoprolol (Lopressor) two hours prior to test. 1 pill total (161m) . Take Ivabradine (Corlanor) two hours prior to test.  2 pills total (140m          After the Test: . Drink plenty of water. . After receiving IV contrast, you may experience a mild flushed feeling. This is normal. . On occasion, you may experience a mild rash up to 24 hours after the test. This is not dangerous. If this occurs, you can take Benadryl 25 mg and increase your fluid intake. . If you experience trouble breathing, this can be serious. If it is severe call 911 IMMEDIATELY. If it is mild, please call our office. . If you take any of these medications: Glipizide/Metformin, Avandament, Glucavance, please do not take 48 hours after completing test unless otherwise instructed.   Once we have confirmed authorization from your insurance company, we will call you to set up a date and time for your test. Based on how quickly your insurance processes prior authorizations requests, please allow up to 4 weeks to be contacted for scheduling your Cardiac CT appointment. Be advised that routine Cardiac CT appointments could be scheduled as many as 8 weeks after your provider has ordered it.  For non-scheduling related questions, please contact  the cardiac imaging nurse navigator should you have any questions/concerns: SaMarchia BondCardiac Imaging Nurse Navigator MeBurley SaverInterim Cardiac Imaging Nurse [facility] Services Direct Office Dial: [phone] For scheduling needs, including cancellations and rescheduling, please call [phone] number).       Follow-Up: [facility] needs are our [facility].  As part of our continuing mission to provide you with exceptional heart care, we have created designated Provider Care Teams.  These Care Teams include your primary Cardiologist (physician) and Advanced Practice Providers (APPs -  Physician Assistants and Nurse Practitioners) who all work together to provide you with the care you need, when you need it.  We recommend signing up for the patient portal called "MyChart".  Sign up information is provided on this After Visit Summary.  MyChart is used to connect with patients for Virtual Visits (Telemedicine).  Patients are able to view lab/test results, encounter notes, upcoming appointments, etc.  Non-urgent messages can be sent to your provider as well.   To learn more about what you can do with MyChart, go to [email or web address]   Your next appointment:   5 week(s)  The format for your next appointment:   In Person  Provider:   BrKate SableMD   Other Instructions      Signed, BrKate SableMD  07/25/2020 4:43 PM    [place]

## 2020-07-26 LAB — BCR-ABL1 FISH
Cells Analyzed: 200
Cells Counted: 200

## 2020-08-05 ENCOUNTER — Telehealth (HOSPITAL_COMMUNITY): Payer: Self-pay | Admitting: Emergency Medicine

## 2020-08-05 NOTE — Addendum Note (Signed)
Addended by: Kavin Leech on: 08/05/2020 09:47 AM   Modules accepted: Orders

## 2020-08-05 NOTE — Telephone Encounter (Signed)
Attempted to call patient regarding upcoming cardiac CT appointment. °Left message on voicemail with name and callback number °Bobbyjo Marulanda RN Navigator Cardiac Imaging ° [facility] °[phone] Office °[phone] Cell ° °

## 2020-08-06 ENCOUNTER — Telehealth (HOSPITAL_COMMUNITY): Payer: Self-pay | Admitting: Emergency Medicine

## 2020-08-06 NOTE — Telephone Encounter (Signed)
Attempted to call patient regarding upcoming cardiac CT appointment. °Left message on voicemail with name and callback number °Chizuko Trine RN Navigator Cardiac Imaging °[facility] °[phone] Office °[phone] Cell ° °

## 2020-08-06 NOTE — Telephone Encounter (Signed)
Reaching out to patient to offer assistance regarding upcoming cardiac imaging study; pt verbalizes understanding of appt date/time, parking situation and where to check in, pre-test NPO status and medications ordered, and verified current allergies; name and call back number provided for further questions should they arise Adam Bond RN Navigator Cardiac Imaging Adam Holloway [facility] [phone] office [phone] cell   100mg  metoprolol+ 10mg  ivabradine to be taken 2 hr prior to scan  Adam Holloway

## 2020-08-07 ENCOUNTER — Ambulatory Visit
Admission: RE | Admit: 2020-08-07 | Discharge: 2020-08-07 | Disposition: A | Discharge status: Home / Self Care | Payer: Medicare Other | Source: Ambulatory Visit | Attending: Cardiology | Admitting: Cardiology

## 2020-08-07 ENCOUNTER — Other Ambulatory Visit: Payer: Self-pay

## 2020-08-07 DIAGNOSIS — R0609 Other forms of dyspnea: Secondary | ICD-10-CM

## 2020-08-07 DIAGNOSIS — R931 Abnormal findings on diagnostic imaging of heart and coronary circulation: Secondary | ICD-10-CM | POA: Insufficient documentation

## 2020-08-07 DIAGNOSIS — I4892 Unspecified atrial flutter: Secondary | ICD-10-CM | POA: Diagnosis not present

## 2020-08-07 DIAGNOSIS — R0600 Dyspnea, unspecified: Secondary | ICD-10-CM | POA: Insufficient documentation

## 2020-08-07 DIAGNOSIS — R072 Precordial pain: Secondary | ICD-10-CM | POA: Insufficient documentation

## 2020-08-07 HISTORY — DX: Acute pancreatitis without necrosis or infection, unspecified: K85.90

## 2020-08-07 MED ORDER — IOHEXOL 350 MG/ML SOLN
100.0000 mL | Freq: Once | INTRAVENOUS | Status: AC | PRN
  Administered 2020-08-07: 85 mL via INTRAVENOUS

## 2020-08-07 MED ORDER — NITROGLYCERIN 0.4 MG SL SUBL
0.8000 mg | SUBLINGUAL_TABLET | Freq: Once | SUBLINGUAL | Status: AC
  Administered 2020-08-07: 0.8 mg via SUBLINGUAL

## 2020-08-07 MED ORDER — METOPROLOL TARTRATE 5 MG/5ML IV SOLN
10.0000 mg | Freq: Once | INTRAVENOUS | Status: AC
  Administered 2020-08-07: 10 mg via INTRAVENOUS

## 2020-08-07 NOTE — Progress Notes (Signed)
Patient tolerated procedure well. Ambulate w/o difficulty. Sitting in chair drinking water and coffee provided. Encouraged to drink extra water today and reasoning explained. Verbalized understanding. All questions answered. ABC intact. No further needs. Discharge from procedure area w/o issues.

## 2020-08-11 DIAGNOSIS — I4892 Unspecified atrial flutter: Secondary | ICD-10-CM | POA: Diagnosis not present

## 2020-08-11 DIAGNOSIS — R072 Precordial pain: Secondary | ICD-10-CM | POA: Diagnosis not present

## 2020-08-12 ENCOUNTER — Telehealth: Payer: Self-pay

## 2020-08-12 DIAGNOSIS — I2584 Coronary atherosclerosis due to calcified coronary lesion: Secondary | ICD-10-CM

## 2020-08-12 MED ORDER — ATORVASTATIN CALCIUM 20 MG PO TABS: 20.0000 mg | ORAL_TABLET | Freq: Every day | ORAL | 5 refills | Status: DC

## 2020-08-12 NOTE — Telephone Encounter (Signed)
Spoke with patient and gave him the result note from his Cardiac Calcium test as follows:  "Coronary artery calcification noted in the LAD and left circumflex, causing mild to moderate stenosis. No significant stenosis was noted. CT FFR was sent to make sure there is no hemodynamically significant stenosis in the coronary arteries. Recommend starting aspirin 81 mg, Lipitor 20 mg daily. Smoking cessation advised. Obtain fasting lipid profile prior to follow-up visit."  Patient will get his Lipid panel drawn during his Echo appointment on 08/20/20. Prescription sent in to patients pharmacy.

## 2020-08-20 ENCOUNTER — Other Ambulatory Visit
Admission: RE | Admit: 2020-08-20 | Discharge: 2020-08-20 | Disposition: A | Discharge status: Home / Self Care | Payer: Medicare Other | Attending: Cardiology | Admitting: Cardiology

## 2020-08-20 ENCOUNTER — Ambulatory Visit (INDEPENDENT_AMBULATORY_CARE_PROVIDER_SITE_OTHER): Payer: Medicare Other

## 2020-08-20 ENCOUNTER — Other Ambulatory Visit: Payer: Self-pay

## 2020-08-20 DIAGNOSIS — D72829 Elevated white blood cell count, unspecified: Secondary | ICD-10-CM | POA: Diagnosis present

## 2020-08-20 DIAGNOSIS — I2584 Coronary atherosclerosis due to calcified coronary lesion: Secondary | ICD-10-CM

## 2020-08-20 DIAGNOSIS — I2510 Atherosclerotic heart disease of native coronary artery without angina pectoris: Secondary | ICD-10-CM | POA: Insufficient documentation

## 2020-08-20 DIAGNOSIS — I4892 Unspecified atrial flutter: Secondary | ICD-10-CM

## 2020-08-20 LAB — CBC WITH DIFFERENTIAL/PLATELET
Abs Immature Granulocytes: 0.03 10*3/uL (ref 0.00–0.07)
Basophils Absolute: 0 10*3/uL (ref 0.0–0.1)
Basophils Relative: 0 %
Eosinophils Absolute: 0.1 10*3/uL (ref 0.0–0.5)
Eosinophils Relative: 1 %
HCT: 43.7 % (ref 39.0–52.0)
Hemoglobin: 14.6 g/dL (ref 13.0–17.0)
Immature Granulocytes: 0 %
Lymphocytes Relative: 22 %
Lymphs Abs: 2.1 10*3/uL (ref 0.7–4.0)
MCH: 30.4 pg (ref 26.0–34.0)
MCHC: 33.4 g/dL (ref 30.0–36.0)
MCV: 90.9 fL (ref 80.0–100.0)
Monocytes Absolute: 0.8 10*3/uL (ref 0.1–1.0)
Monocytes Relative: 8 %
Neutro Abs: 6.8 10*3/uL (ref 1.7–7.7)
Neutrophils Relative %: 69 %
Platelets: 176 10*3/uL (ref 150–400)
RBC: 4.81 MIL/uL (ref 4.22–5.81)
RDW: 15.4 % (ref 11.5–15.5)
WBC: 9.9 10*3/uL (ref 4.0–10.5)
nRBC: 0 % (ref 0.0–0.2)

## 2020-08-20 LAB — LIPID PANEL
Cholesterol: 193 mg/dL (ref 0–200)
HDL: 44 mg/dL (ref >40)
LDL Cholesterol: 130 mg/dL — ABNORMAL HIGH (ref 0–99)
Total CHOL/HDL Ratio: 4.4 RATIO
Triglycerides: 96 mg/dL (ref <150)
VLDL: 19 mg/dL (ref 0–40)

## 2020-08-21 LAB — ECHOCARDIOGRAM COMPLETE
Area-P 1/2: 3.81 cm2
S' Lateral: 3.4 cm

## 2020-08-29 ENCOUNTER — Encounter: Payer: Self-pay | Admitting: Cardiology

## 2020-08-29 ENCOUNTER — Other Ambulatory Visit: Payer: Self-pay

## 2020-08-29 ENCOUNTER — Ambulatory Visit (INDEPENDENT_AMBULATORY_CARE_PROVIDER_SITE_OTHER): Payer: Medicare Other | Admitting: Cardiology

## 2020-08-29 VITALS — BP 128/80 | HR 70 | Ht 69.0 in | Wt 226.1 lb

## 2020-08-29 DIAGNOSIS — I4892 Unspecified atrial flutter: Secondary | ICD-10-CM | POA: Diagnosis not present

## 2020-08-29 DIAGNOSIS — I2510 Atherosclerotic heart disease of native coronary artery without angina pectoris: Secondary | ICD-10-CM

## 2020-08-29 DIAGNOSIS — F17200 Nicotine dependence, unspecified, uncomplicated: Secondary | ICD-10-CM | POA: Diagnosis not present

## 2020-08-29 MED ORDER — METOPROLOL SUCCINATE ER 25 MG PO TB24: 25.0000 mg | ORAL_TABLET | Freq: Every day | ORAL | 5 refills | Status: DC

## 2020-08-29 NOTE — Progress Notes (Signed)
Cardiology Office Note:    Date:  08/29/2020   ID:  Adam Holloway, DOB 10/27/1970, MRN [id number]  PCP:  [name], MD  [facility] Cardiologist:  Kate Sable, MD  [place] Electrophysiologist:  None   Referring MD: [name], MD   Chief Complaint  Patient presents with  . Follow-up    5 week & discuss cardiac CT & Echo results. Patient c/o fluttering in chest, shortness of breath and feels a heaviness in his body to the point he feels the need to sit down; this happens mostly after eating.    History of Present Illness:    Adam Holloway is a 49 y.o. male with a hx of paroxysmal atrial flutter, migraines, PTSD, GERD, current smoker x30+ years who presents for follow-up.  Last seen due to atrial flutter and dyspnea on exertion.  Chest CT did reveal calcification in the LAD.  Due to a flutter and symptoms, echo, coronary CTA was ordered.  He now presents for results.   He has occasional palpitations/heart flutters almost daily.  Symptoms are associated with fatigue.  He has cut down his smoking to about 2 cigarettes a day.  Prior notes Cardiac monitor was placed last month on 06/2020 showing about 12 minutes of atrial flutter, less than 1% burden.  He has a history of elevated white counts, chest CT was ordered for work-up showing calcifications in his LAD.   Past Medical History:  Diagnosis Date  . Arthritis    back, left wrist, left knee  . Complication of anesthesia    bp dropped during gallbladder surgery in 2008  . Depression   . Family history of adverse reaction to anesthesia    mom stopped breathing during colonscopy  . GERD (gastroesophageal reflux disease)    h/o  . HA (headache)    h/o migrianes  . Herniated disc, cervical   . History of kidney stones    h/o stones  . History of leukocytosis   . Lumbago   . Pain in soft tissues of limb   . Pancreatitis   . Peripheral neuropathy 04/12/2019  . PTSD (post-traumatic stress disorder)    . Scoliosis     Past Surgical History:  Procedure Laterality Date  . CHOLECYSTECTOMY  2008  . COLONOSCOPY  2010  . COLONOSCOPY WITH PROPOFOL N/A 06/09/2016   Procedure: COLONOSCOPY WITH PROPOFOL;  Surgeon: Christene Lye, MD;  Location: [facility];  Service: Endoscopy;  Laterality: N/A;  . FISSURECTOMY N/A 06/29/2016   Procedure: FISSURECTOMY;  Surgeon: Christene Lye, MD;  Location: [facility];  Service: General;  Laterality: N/A;  . HEMORRHOID SURGERY N/A 06/29/2016   Procedure: HEMORRHOIDECTOMY;  Surgeon: Christene Lye, MD;  Location: [facility];  Service: General;  Laterality: N/A;  . PAROTIDECTOMY Left 1997  . SALIVARY GLAND SURGERY  1997  . SPHINCTEROTOMY N/A 06/29/2016   Procedure: SPHINCTEROTOMY;  Surgeon: Christene Lye, MD;  Location: [facility] OR[facility];  Service: General;  Laterality: N/A;  . WRIST ARTHROSCOPY Left 01/26/2017   Procedure: ARTHROSCOPY LEFT WRIST DEBRIDEMENT;  Surgeon: Daryll Brod, MD;  Location: [place];  Service: Orthopedics;  Laterality: Left;  . WRIST SURGERY  2019    Current Medications: Current Meds  Medication Sig  . aspirin (ASPIRIN 81) 81 MG EC tablet Take 81 mg by mouth daily. Swallow whole.  Marland Kitchen atorvastatin (LIPITOR) 20 MG tablet Take 1 tablet (20 mg total) by mouth daily.  . Vitamin D, Ergocalciferol, (DRISDOL) 1.25 MG (50000  UNIT) CAPS capsule Take 50,000 Units by mouth once a week.     Allergies:   Augmentin [amoxicillin-pot clavulanate], Hydrocodone-acetaminophen, Nsaids, Tolmetin, and Meloxicam   Social History   Socioeconomic History  . Marital status: Single    Spouse name: Not on file  . Number of children: 1  . Years of education: GED  . Highest education level: Not on file  Occupational History    Comment: Disabled  Tobacco Use  . Smoking status: Current Every Day Smoker    Packs/day: 0.50    Years: 32.00    Pack years: 16.00    Types: Cigarettes    Last attempt to quit: 01/16/2019     Years since quitting: 1.6  . Smokeless tobacco: Former Systems developer  . Tobacco comment: Quit 2020 and restarted 10/2019 1-2 cig/day  Vaping Use  . Vaping Use: Never used  Substance and Sexual Activity  . Alcohol use: No    Alcohol/week: 0.0 standard drinks  . Drug use: No  . Sexual activity: Not on file  Other Topics Concern  . Not on file  Social History Narrative   Patient lives at home with his mother.   Disabled.   Education GED.   Right handed.   Caffeine two cups of coffee daily.   Social Determinants of Health   Financial Resource Strain: Not on file  Food Insecurity: Not on file  Transportation Needs: Not on file  Physical Activity: Not on file  Stress: Not on file  Social Connections: Not on file     Family History: The patient's family history includes Cancer in his father; Colon cancer in his mother; Heart Problems in his father; Liver cancer in his brother; Lung cancer in his father.  ROS:   Please see the history of present illness.     All other systems reviewed and are negative.  EKGs/Labs/Other Studies Reviewed:    The following studies were reviewed today:   EKG:  EKG is  ordered today.  The ekg ordered today demonstrates sinus rhythm, normal ECG.  Recent Labs: 07/23/2020: ALT 27; BUN 14; Creatinine, Ser 1.01; Potassium 4.2; Sodium 140 08/20/2020: Hemoglobin 14.6; Platelets 176  Recent Lipid Panel    Component Value Date/Time   CHOL 193 08/20/2020 1005   TRIG 96 08/20/2020 1005   HDL 44 08/20/2020 1005   CHOLHDL 4.4 08/20/2020 1005   VLDL 19 08/20/2020 1005   LDLCALC 130 (H) 08/20/2020 1005     Risk Assessment/Calculations:      Physical Exam:    VS:  BP 128/80 (BP Location: Left Arm, Patient Position: Sitting, Cuff Size: Normal)   Pulse 70   Ht 5\' 9"  (1.753 m)   Wt 226 lb 2 oz (102.6 kg)   SpO2 98%   BMI 33.39 kg/m     Wt Readings from Last 3 Encounters:  08/29/20 226 lb 2 oz (102.6 kg)  07/25/20 223 lb (101.2 kg)  07/23/20 224 lb 13.9  oz (102 kg)     GEN:  Well nourished, well developed in no acute distress HEENT: Normal NECK: No JVD; No carotid bruits LYMPHATICS: No lymphadenopathy CARDIAC: RRR, no murmurs, rubs, gallops RESPIRATORY:  Clear to auscultation without rales, wheezing or rhonchi  ABDOMEN: Soft, non-tender, non-distended MUSCULOSKELETAL:  No edema; No deformity  SKIN: Warm and dry NEUROLOGIC:  Alert and oriented x 3 PSYCHIATRIC:  Normal affect   ASSESSMENT:    1. Atrial flutter, unspecified type ([place])   2. Coronary artery disease involving native coronary artery  of native heart, unspecified whether angina present   3. Smoking    PLAN:    In order of problems listed above:  1. Paroxysmal atrial flutter, CHA2DS2-VASc of 1 (vasc).  Echo with low normal systolic function, EF 50 to 17%, normal diastolic function.  Patient very symptomatic, with palpitations.  Start Toprol-XL 25 mg daily.  Refer patient to EP for additional input. 2. Coronary calcium, dyspnea on exertion, coronary CTA with calcium score of 123, calcified plaque in the mid LAD causing mild to moderate stenosis 45 to 50%.  CT FFR with no significant stenosis.  Start aspirin 81 mg, start Lipitor 20 mg daily.  Goal LDL less than 70. 3. Current smoker, cessation advised.  Follow-up in 3 months  Shared Decision Making/Informed Consent      Medication Adjustments/Labs and Tests Ordered: Current medicines are reviewed at length with the patient today.  Concerns regarding medicines are outlined above.  Orders Placed This Encounter  Procedures  . Ambulatory referral to Cardiac Electrophysiology  . EKG 12-Lead   Meds ordered this encounter  Medications  . metoprolol succinate (TOPROL XL) 25 MG 24 hr tablet    Sig: Take 1 tablet (25 mg total) by mouth daily.    Dispense:  30 tablet    Refill:  5    Patient Instructions  .Medication Instructions:   1.  START Toprol XL (Metoprolol Succinate) 25 MG: Take 1 tab by mouth once a  day.  *If you need a refill on your cardiac medications before your next appointment, please call your pharmacy*   Lab Work: None Ordered If you have labs (blood work) drawn today and your tests are completely normal, you will receive your results only by: Marland Kitchen MyChart Message (if you have MyChart) OR . A paper copy in the mail If you have any lab test that is abnormal or we need to change your treatment, we will call you to review the results.   Testing/Procedures: None Ordered   Follow-Up: At [facility], you and your health needs are our priority.  As part of our continuing mission to provide you with exceptional heart care, we have created designated Provider Care Teams.  These Care Teams include your primary Cardiologist (physician) and Advanced Practice Providers (APPs -  Physician Assistants and Nurse Practitioners) who all work together to provide you with the care you need, when you need it.  We recommend signing up for the patient portal called "MyChart".  Sign up information is provided on this After Visit Summary.  MyChart is used to connect with patients for Virtual Visits (Telemedicine).  Patients are able to view lab/test results, encounter notes, upcoming appointments, etc.  Non-urgent messages can be sent to your provider as well.   To learn more about what you can do with MyChart, go to [email or web address].    Your next appointment:   3 month(s)  The format for your next appointment:   In Person  Provider:   Kate Sable, MD    Other Instructions      Signed, Kate Sable, MD  08/29/2020 11:21 AM    [place]

## 2020-08-29 NOTE — Patient Instructions (Signed)
.  Medication Instructions:   1.  START Toprol XL (Metoprolol Succinate) 25 MG: Take 1 tab by mouth once a day.  *If you need a refill on your cardiac medications before your next appointment, please call your pharmacy*   Lab Work: None Ordered If you have labs (blood work) drawn today and your tests are completely normal, you will receive your results only by: Marland Kitchen MyChart Message (if you have MyChart) OR . A paper copy in the mail If you have any lab test that is abnormal or we need to change your treatment, we will call you to review the results.   Testing/Procedures: None Ordered   Follow-Up: At [facility], you and your health needs are our priority.  As part of our continuing mission to provide you with exceptional heart care, we have created designated Provider Care Teams.  These Care Teams include your primary Cardiologist (physician) and Advanced Practice Providers (APPs -  Physician Assistants and Nurse Practitioners) who all work together to provide you with the care you need, when you need it.  We recommend signing up for the patient portal called "MyChart".  Sign up information is provided on this After Visit Summary.  MyChart is used to connect with patients for Virtual Visits (Telemedicine).  Patients are able to view lab/test results, encounter notes, upcoming appointments, etc.  Non-urgent messages can be sent to your provider as well.   To learn more about what you can do with MyChart, go to [email or web address].    Your next appointment:   3 month(s)  The format for your next appointment:   In Person  Provider:   Kate Sable, MD    Other Instructions

## 2020-09-01 ENCOUNTER — Encounter: Payer: Self-pay | Admitting: Orthopaedic Surgery

## 2020-09-05 ENCOUNTER — Ambulatory Visit: Payer: Medicare Other | Admitting: Orthopaedic Surgery

## 2020-09-17 ENCOUNTER — Encounter: Payer: Self-pay | Admitting: Orthopaedic Surgery

## 2020-09-17 ENCOUNTER — Other Ambulatory Visit: Payer: Self-pay

## 2020-09-17 ENCOUNTER — Ambulatory Visit (INDEPENDENT_AMBULATORY_CARE_PROVIDER_SITE_OTHER): Payer: Medicare Other | Admitting: Orthopaedic Surgery

## 2020-09-17 VITALS — Ht 69.0 in | Wt 226.0 lb

## 2020-09-17 DIAGNOSIS — M7712 Lateral epicondylitis, left elbow: Secondary | ICD-10-CM

## 2020-09-17 NOTE — Progress Notes (Signed)
Office Visit Note   Patient: Adam Holloway           Date of Birth: March 12, 1971           MRN: [id number] Visit Date: 09/17/2020              Requested by: [name], MD [address] [place],  [place] [address] PCP: [name], MD   Assessment & Plan: Visit Diagnoses:  1. Left tennis elbow     Plan: Recurrent symptoms of left tennis elbow.  Long discussion regarding treatment options.  He would like to try another cortisone injection.  Also applied a tennis elbow splint.  He does have elevation of blood glucose with cortisone so we will need to be careful.  Will reevaluate if he does not have a good response and consider MRI scan.  Follow-Up Instructions: Return if symptoms worsen or fail to improve.   Orders:  Orders Placed This Encounter  Procedures  . Hand/UE Inj: L elbow   No orders of the defined types were placed in this encounter.     Procedures: Hand/UE Inj: L elbow for lateral epicondylitis on 09/17/2020 4:10 PM  Injected 1 mL of 0.25% Marcaine without epinephrine and 1 mL of betamethasone into the lateral epicondyles left elbow      Clinical Data: No additional findings.   Subjective: Chief Complaint  Patient presents with  . Left Elbow - Pain  Patient presents today for left elbow pain. He was here last in October and received a cortisone injection. He states that the injection helped for a month and a half. He is uncertain if he wants to pursue another injection or not.  Also has noticed a little "funny feeling" in the ulnar 2 digits of his left hand.  He had a positive Tinel's over the ulnar nerve at the left elbow but a similar finding on the right side.  No pain.  HPI  Review of Systems   Objective: Vital Signs: Ht 5\' 9"  (1.753 m)   Wt 226 lb (102.5 kg)   BMI 33.37 kg/m   Physical Exam Constitutional:      Appearance: He is well-developed and well-nourished.  HENT:     Mouth/Throat:     Mouth: Oropharynx is clear and moist.   Eyes:     Extraocular Movements: EOM normal.     Pupils: Pupils are equal, round, and reactive to light.  Pulmonary:     Effort: Pulmonary effort is normal.  Skin:    General: Skin is warm and dry.  Neurological:     Mental Status: He is alert and oriented to person, place, and time.  Psychiatric:        Mood and Affect: Mood and affect normal.        Behavior: Behavior normal.     Ortho Exam left elbow with tenderness directly over the lateral epicondyle.  Pain with grip and extension and not with flexion.  Good grip and good release.  Has a "funny" feeling in the ulnar 2 digits of his left hand but motor exam intact and no deformity.  Has no tenderness over the medial epicondyles.  There is a positive Tinel's over the ulnar nerve at the left elbow but a similar finding on the right.  Neither side has pain over the nerve. Specialty Comments:  No specialty comments available.  Imaging: No results found.   PMFS History: Patient Active Problem List   Diagnosis Date Noted  . Left tennis  elbow 07/10/2020  . Weight loss 06/10/2020  . Vitamin D deficiency 04/11/2020  . Gastroesophageal reflux disease without esophagitis 04/11/2020  . Anxiety 04/11/2020  . Pain in left wrist 12/13/2019  . Pain in left elbow 12/13/2019  . Neuropathy 04/12/2019  . Low vitamin B12 level 02/20/2019  . Leukocytosis 02/12/2019  . Right-sided chest pain 02/12/2019  . Neck pain 12/07/2018  . HA (headache)   . Pain in soft tissues of limb   . Lumbago   . Gynecomastia, male 07/12/2013   Past Medical History:  Diagnosis Date  . Arthritis    back, left wrist, left knee  . Complication of anesthesia    bp dropped during gallbladder surgery in 2008  . Depression   . Family history of adverse reaction to anesthesia    mom stopped breathing during colonscopy  . GERD (gastroesophageal reflux disease)    h/o  . HA (headache)    h/o migrianes  . Herniated disc, cervical   . History of kidney stones     h/o stones  . History of leukocytosis   . Lumbago   . Pain in soft tissues of limb   . Pancreatitis   . Peripheral neuropathy 04/12/2019  . PTSD (post-traumatic stress disorder)   . Scoliosis     Family History  Problem Relation Age of Onset  . Cancer Father   . Heart Problems Father   . Lung cancer Father   . Colon cancer Mother   . Liver cancer Brother     Past Surgical History:  Procedure Laterality Date  . CHOLECYSTECTOMY  2008  . COLONOSCOPY  2010  . COLONOSCOPY WITH PROPOFOL N/A 06/09/2016   Procedure: COLONOSCOPY WITH PROPOFOL;  Surgeon: Kieth Brightly, MD;  Location: [facility];  Service: Endoscopy;  Laterality: N/A;  . FISSURECTOMY N/A 06/29/2016   Procedure: FISSURECTOMY;  Surgeon: Kieth Brightly, MD;  Location: [facility];  Service: General;  Laterality: N/A;  . HEMORRHOID SURGERY N/A 06/29/2016   Procedure: HEMORRHOIDECTOMY;  Surgeon: Kieth Brightly, MD;  Location: [facility];  Service: General;  Laterality: N/A;  . PAROTIDECTOMY Left 1997  . SALIVARY GLAND SURGERY  1997  . SPHINCTEROTOMY N/A 06/29/2016   Procedure: SPHINCTEROTOMY;  Surgeon: Kieth Brightly, MD;  Location: [facility];  Service: General;  Laterality: N/A;  . WRIST ARTHROSCOPY Left 01/26/2017   Procedure: ARTHROSCOPY LEFT WRIST DEBRIDEMENT;  Surgeon: Cindee Salt, MD;  Location: [facility];  Service: Orthopedics;  Laterality: Left;  . WRIST SURGERY  2019   Social History   Occupational History    Comment: Disabled  Tobacco Use  . Smoking status: Current Every Day Smoker    Packs/day: 0.50    Years: 32.00    Pack years: 16.00    Types: Cigarettes    Last attempt to quit: 01/16/2019    Years since quitting: 1.6  . Smokeless tobacco: Former Neurosurgeon  . Tobacco comment: Quit 2020 and restarted 10/2019 1-2 cig/day  Vaping Use  . Vaping Use: Never used  Substance and Sexual Activity  . Alcohol use: No    Alcohol/week: 0.0 standard drinks  . Drug use: No  .  Sexual activity: Not on file

## 2020-10-02 ENCOUNTER — Ambulatory Visit (INDEPENDENT_AMBULATORY_CARE_PROVIDER_SITE_OTHER): Payer: Medicare Other | Admitting: Cardiology

## 2020-10-02 ENCOUNTER — Encounter: Payer: Self-pay | Admitting: Cardiology

## 2020-10-02 ENCOUNTER — Other Ambulatory Visit: Payer: Self-pay

## 2020-10-02 VITALS — BP 128/88 | HR 79 | Ht 69.0 in | Wt 228.0 lb

## 2020-10-02 DIAGNOSIS — I2584 Coronary atherosclerosis due to calcified coronary lesion: Secondary | ICD-10-CM

## 2020-10-02 DIAGNOSIS — F17200 Nicotine dependence, unspecified, uncomplicated: Secondary | ICD-10-CM | POA: Diagnosis not present

## 2020-10-02 DIAGNOSIS — I4892 Unspecified atrial flutter: Secondary | ICD-10-CM | POA: Diagnosis not present

## 2020-10-02 DIAGNOSIS — I2510 Atherosclerotic heart disease of native coronary artery without angina pectoris: Secondary | ICD-10-CM

## 2020-10-02 MED ORDER — APIXABAN 5 MG PO TABS: 5.0000 mg | ORAL_TABLET | Freq: Two times a day (BID) | ORAL | 6 refills | Status: DC

## 2020-10-02 NOTE — Patient Instructions (Addendum)
Medication Instructions:  - Your physician has recommended you make the following change in your medication:   1) START Eliquis 5 mg: - take 1 tablet by mouth TWICE daily   Samples Given: Eliquis 5 mg Lot: [id number] Exp: 10/2022 # 2 boxes   *If you need a refill on your cardiac medications before your next appointment, please call your pharmacy*   Lab Work:  Do this 1st 1) Pre procedure lab work: Friday 11/08/20 (7:30 am- 12:30 pm)  [facility] at [facility] 1st desk on the right to check in, past the screening table   Do this 2nd 2) Pre procedure COVID swab: Friday 11/08/20 (8:00 am- 1:00 pm)  [place] up test only- staff will come out to the car to swab you  If you have labs (blood work) drawn today and your tests are completely normal, you will receive your results only by: Marland Kitchen MyChart Message (if you have MyChart) OR . A paper copy in the mail If you have any lab test that is abnormal or we need to change your treatment, we will call you to review the results.   Testing/Procedures: - Your physician has recommended that you have an Atrial Flutter ablation. Catheter ablation is a medical procedure used to treat some cardiac arrhythmias (irregular heartbeats). During catheter ablation, a long, thin, flexible tube is put into a blood vessel in your groin (upper thigh), or neck. This tube is called an ablation catheter. It is then guided to your heart through the blood vessel. Radio frequency waves destroy small areas of heart tissue where abnormal heartbeats may cause an arrhythmia to start.   Monday 11/11/20  Arrive at 8:30 am  [facility]  A detailed letter of instructions will be mailed to you/ sent to your MyChart   Follow-Up: At [facility], you and your health needs are our priority.  As part of our continuing mission to provide you with exceptional heart care, we have created designated Provider Care Teams.  These Care Teams include  your primary Cardiologist (physician) and Advanced Practice Providers (APPs -  Physician Assistants and Nurse Practitioners) who all work together to provide you with the care you need, when you need it.  We recommend signing up for the patient portal called "MyChart".  Sign up information is provided on this After Visit Summary.  MyChart is used to connect with patients for Virtual Visits (Telemedicine).  Patients are able to view lab/test results, encounter notes, upcoming appointments, etc.  Non-urgent messages can be sent to your provider as well.   To learn more about what you can do with MyChart, go to [email or web address].    Your next appointment:   4 week(s) (from 11/11/20)  The format for your next appointment:   In Person  Provider:   Lars Mage, MD   Other Instructions   ELIQUIS (Apixaban) Tablets What is this medicine? APIXABAN (a PIX a ban) is an anticoagulant (blood thinner). It is used to lower the chance of stroke in people with a medical condition called atrial fibrillation. It is also used to treat or prevent blood clots in the lungs or in the veins. This medicine may be used for other purposes; ask your health care provider or pharmacist if you have questions. COMMON BRAND NAME(S): Eliquis What should I tell my health care provider before I take this medicine? They need to know if you have any of these conditions:  antiphospholipid antibody syndrome  bleeding disorders  bleeding  in the brain  blood in your stools (black or tarry stools) or if you have blood in your vomit  history of blood clots  history of stomach bleeding  kidney disease  liver disease  mechanical heart valve  an unusual or allergic reaction to apixaban, other medicines, foods, dyes, or preservatives  pregnant or trying to get pregnant  breast-feeding How should I use this medicine? Take this medicine by mouth with a glass of water. Follow the directions on the  prescription label. You can take it with or without food. If it upsets your stomach, take it with food. Take your medicine at regular intervals. Do not take it more often than directed. Do not stop taking except on your doctor's advice. Stopping this medicine may increase your risk of a blood clot. Be sure to refill your prescription before you run out of medicine. Talk to your pediatrician regarding the use of this medicine in children. Special care may be needed. Overdosage: If you think you have taken too much of this medicine contact a poison control center or emergency room at once. NOTE: This medicine is only for you. Do not share this medicine with others. What if I miss a dose? If you miss a dose, take it as soon as you can. If it is almost time for your next dose, take only that dose. Do not take double or extra doses. What may interact with this medicine? This medicine may interact with the following:  aspirin and aspirin-like medicines  certain medicines for fungal infections like ketoconazole and itraconazole  certain medicines for seizures like carbamazepine and phenytoin  certain medicines that treat or prevent blood clots like warfarin, enoxaparin, and dalteparin  clarithromycin  NSAIDs, medicines for pain and inflammation, like ibuprofen or naproxen  rifampin  ritonavir  St. John's wort This list may not describe all possible interactions. Give your health care provider a list of all the medicines, herbs, non-prescription drugs, or dietary supplements you use. Also tell them if you smoke, drink alcohol, or use illegal drugs. Some items may interact with your medicine. What should I watch for while using this medicine? Visit your healthcare professional for regular checks on your progress. You may need blood work done while you are taking this medicine. Your condition will be monitored carefully while you are receiving this medicine. It is important not to miss any  appointments. Avoid sports and activities that might cause injury while you are using this medicine. Severe falls or injuries can cause unseen bleeding. Be careful when using sharp tools or knives. Consider using an Copy. Take special care brushing or flossing your teeth. Report any injuries, bruising, or red spots on the skin to your healthcare professional. If you are going to need surgery or other procedure, tell your healthcare professional that you are taking this medicine. Wear a medical ID bracelet or chain. Carry a card that describes your disease and details of your medicine and dosage times. What side effects may I notice from receiving this medicine? Side effects that you should report to your doctor or health care professional as soon as possible:  allergic reactions like skin rash, itching or hives, swelling of the face, lips, or tongue  signs and symptoms of bleeding such as bloody or black, tarry stools; red or dark-brown urine; spitting up blood or brown material that looks like coffee grounds; red spots on the skin; unusual bruising or bleeding from the eye, gums, or nose  signs and symptoms  of a blood clot such as chest pain; shortness of breath; pain, swelling, or warmth in the leg  signs and symptoms of a stroke such as changes in vision; confusion; trouble speaking or understanding; severe headaches; sudden numbness or weakness of the face, arm or leg; trouble walking; dizziness; loss of coordination This list may not describe all possible side effects. Call your doctor for medical advice about side effects. You may report side effects to FDA at 1-800-FDA-1088. Where should I keep my medicine? Keep out of the reach of children. Store at room temperature between 20 and 25 degrees C (68 and 77 degrees F). Throw away any unused medicine after the expiration date. NOTE: This sheet is a summary. It may not cover all possible information. If you have questions about this  medicine, talk to your doctor, pharmacist, or health care provider.  2021 Elsevier/Gold Standard (2020-07-10 16:54:11)     Atrial Flutter  Atrial flutter is a type of abnormal heart rhythm (arrhythmia). The heart has an electrical system that tells it how to beat. In atrial flutter, the signals move rapidly in the top chambers of the heart (the atria). This makes your heart beat very fast. Atrial flutter can come and go, or it can be permanent. The goal of treatment is to prevent blood clots from forming, control your heart rate, or restore your heartbeat to a normal rhythm. If this condition is not treated, it can cause serious problems, such as a weakened heart muscle (cardiomyopathy) or a stroke. What are the causes? This condition is often caused by conditions that damage the heart's electrical system. These include:  Heart conditions and heart surgery. These include heart attacks and open-heart surgery.  Lung problems, such as COPD or a blood clot in the lung (pulmonary embolism, or PE).  Poorly controlled high blood pressure (hypertension).  Overactive thyroid (hyperthyroidism).  Diabetes. In some cases, the cause of this condition is not known. What increases the risk? You are more likely to develop this condition if:  You are an elderly adult.  You are a man.  You are overweight (obese).  You have obstructive sleep apnea.  You have a family history of atrial flutter.  You have diabetes.  You drink a lot of alcohol, especially binge drinking.  You use drugs, including cannabis.  You smoke. What are the signs or symptoms? Symptoms of this condition include:  A feeling that your heart is pounding or racing (palpitations).  Shortness of breath.  Chest pain.  Feeling dizzy or light-headed.  Fainting.  Low blood pressure (hypotension).  Fatigue.  Tiring easily during exercise or activity. In some cases, there are no symptoms. How is this  diagnosed? This condition may be diagnosed with:  An electrocardiogram (ECG) to check electrical signals of the heart.  An ambulatory cardiac monitor to record your heart's activity for a few days.  An echocardiogram to create pictures of your heart.  A transesophageal echocardiogram (TEE) to create even better pictures of your heart.  A stress test to check your blood supply while you exercise.  Imaging tests, such as a CT scan or chest X-ray.  Blood tests. How is this treated? Treatment depends on underlying conditions and how you feel when you experience atrial flutter. This condition may be treated with:  Medicines to prevent blood clots or to treat heart rate or heart rhythm problems.  Electrical cardioversion to reset the heart's rhythm.  Ablation to remove the heart tissue that sends abnormal signals.  Left atrial appendage closure to seal the area where blood clots can form. In some cases, underlying conditions will be treated. Follow these instructions at home: Medicines  Take over-the-counter and prescription medicines only as told by your health care provider.  Do not take any new medicines without talking to your health care provider.  If you are taking blood thinners: ? Talk with your health care provider before you take any medicines that contain aspirin or NSAIDs, such as ibuprofen. These medicines increase your risk for dangerous bleeding. ? Take your medicine exactly as told, at the same time every day. ? Avoid activities that could cause injury or bruising, and follow instructions about how to prevent falls. ? Wear a medical alert bracelet or carry a card that lists what medicines you take. Lifestyle  Eat heart-healthy foods. Talk with a dietitian to make an eating plan that is right for you.  Do not use any products that contain nicotine or tobacco, such as cigarettes, e-cigarettes, and chewing tobacco. If you need help quitting, ask your health care  provider.  Do not drink alcohol.  Do not use drugs, including cannabis.  Lose weight if you are overweight or obese.  Exercise regularly as instructed by your health care provider. General instructions  Do not use diet pills unless your health care provider approves. Diet pills may make heart problems worse.  If you have obstructive sleep apnea, manage your condition as told by your health care provider.  Keep all follow-up visits as told by your health care provider. This is important. Contact a health care provider if you:  Notice a change in the rate, rhythm, or strength of your heartbeat.  Are taking a blood thinner and you notice more bruising.  Have a sudden change in weight.  Tire more easily when you exercise or do heavy work. Get help right away if you have:  Pain or pressure in your chest.  Shortness of breath.  Fainting.  Increasing sweating with no known cause.  Side effects of blood thinners, such as blood in your vomit, stool, or urine, or bleeding that cannot stop.  Any symptoms of a stroke. "BE FAST" is an easy way to remember the main warning signs of a stroke: ? B - Balance. Signs are dizziness, sudden trouble walking, or loss of balance. ? E - Eyes. Signs are trouble seeing or a sudden change in vision. ? F - Face. Signs are sudden weakness or numbness of the face, or the face or eyelid drooping on one side. ? A - Arms. Signs are weakness or numbness in an arm. This happens suddenly and usually on one side of the body. ? S - Speech. Signs are sudden trouble speaking, slurred speech, or trouble understanding what people say. ? T - Time. Time to call emergency services. Write down what time symptoms started.  Other signs of a stroke, such as: ? A sudden, severe headache with no known cause. ? Nausea or vomiting. ? Seizure.  These symptoms may represent a serious problem that is an emergency. Do not wait to see if the symptoms will go away. Get  medical help right away. Call your local emergency services (911 in the [place] Do not drive yourself to the hospital. Summary  Atrial flutter is an abnormal heart rhythm that can give you symptoms of palpitations, shortness of breath, or fatigue.  Atrial flutter is often treated with medicines to keep your heart in a normal rhythm and to prevent a stroke.  Get help right away if you cannot catch your breath, or have chest pain or pressure.  Get help right away if you have signs or symptoms of a stroke. This information is not intended to replace advice given to you by your health care provider. Make sure you discuss any questions you have with your health care provider. Document Revised: 02/22/2019 Document Reviewed: 02/22/2019 Elsevier Patient Education  2021 [place].     Cardiac Ablation Cardiac ablation is a procedure to destroy, or ablate, a small amount of heart tissue in very specific places. The heart has many electrical connections. Sometimes these connections are abnormal and can cause the heart to beat very fast or irregularly. Ablating some of the areas that cause problems can improve the heart's rhythm or return it to normal. Ablation may be done for people who:  Have Wolff-Parkinson-White syndrome.  Have fast heart rhythms (tachycardia).  Have taken medicines for an abnormal heart rhythm (arrhythmia) that were not effective or caused side effects.  Have a high-risk heartbeat that may be life-threatening. During the procedure, a small incision is made in the neck or the groin, and a long, thin tube (catheter) is inserted into the incision and moved to the heart. Small devices (electrodes) on the tip of the catheter will send out electrical currents. A type of X-ray (fluoroscopy) will be used to help guide the catheter and to provide images of the heart. Tell a health care provider about:  Any allergies you have.  All medicines you are taking, including vitamins,  herbs, eye drops, creams, and over-the-counter medicines.  Any problems you or family members have had with anesthetic medicines.  Any blood disorders you have.  Any surgeries you have had.  Any medical conditions you have, such as kidney failure.  Whether you are pregnant or may be pregnant. What are the risks? Generally, this is a safe procedure. However, problems may occur, including:  Infection.  Bruising and bleeding at the catheter insertion site.  Bleeding into the chest, especially into the sac that surrounds the heart. This is a serious complication.  Stroke or blood clots.  Damage to nearby structures or organs.  Allergic reaction to medicines or dyes.  Need for a permanent pacemaker if the normal electrical system is damaged. A pacemaker is a small computer that sends electrical signals to the heart and helps your heart beat normally.  The procedure not being fully effective. This may not be recognized until months later. Repeat ablation procedures are sometimes done. What happens before the procedure? Medicines Ask your health care provider about:  Changing or stopping your regular medicines. This is especially important if you are taking diabetes medicines or blood thinners.  Taking medicines such as aspirin and ibuprofen. These medicines can thin your blood. Do not take these medicines unless your health care provider tells you to take them.  Taking over-the-counter medicines, vitamins, herbs, and supplements. General instructions  Follow instructions from your health care provider about eating or drinking restrictions.  Plan to have someone take you home from the hospital or clinic.  If you will be going home right after the procedure, plan to have someone with you for 24 hours.  Ask your health care provider what steps will be taken to prevent infection. What happens during the procedure?  An IV will be inserted into one of your veins.  You will be  given a medicine to help you relax (sedative).  The skin on your neck or groin will  be numbed.  An incision will be made in your neck or your groin.  A needle will be inserted through the incision and into a large vein in your neck or groin.  A catheter will be inserted into the needle and moved to your heart.  Dye may be injected through the catheter to help your surgeon see the area of the heart that needs treatment.  Electrical currents will be sent from the catheter to ablate heart tissue in desired areas. There are three types of energy that may be used to do this: ? Heat (radiofrequency energy). ? Laser energy. ? Extreme cold (cryoablation).  When the tissue has been ablated, the catheter will be removed.  Pressure will be held on the insertion area to prevent a lot of bleeding.  A bandage (dressing) will be placed over the insertion area. The exact procedure may vary among health care providers and hospitals.   What happens after the procedure?  Your blood pressure, heart rate, breathing rate, and blood oxygen level will be monitored until you leave the hospital or clinic.  Your insertion area will be monitored for bleeding. You will need to lie still for a few hours to ensure that you do not bleed from the insertion area.  Do not drive for 24 hours or as long as told by your health care provider. Summary  Cardiac ablation is a procedure to destroy, or ablate, a small amount of heart tissue using an electrical current. This procedure can improve the heart rhythm or return it to normal.  Tell your health care provider about any medical conditions you may have and all medicines you are taking to treat them.  This is a safe procedure, but problems may occur. Problems may include infection, bruising, damage to nearby organs or structures, or allergic reactions to medicines.  Follow your health care provider's instructions about eating and drinking before the procedure. You  may also be told to change or stop some of your medicines.  After the procedure, do not drive for 24 hours or as long as told by your health care provider. This information is not intended to replace advice given to you by your health care provider. Make sure you discuss any questions you have with your health care provider. Document Revised: 07/10/2019 Document Reviewed: 07/10/2019 Elsevier Patient Education  [employer].

## 2020-10-02 NOTE — Progress Notes (Signed)
Electrophysiology Office Note:    Date:  10/02/2020   ID:  Adam Holloway, DOB 02/18/1971, MRN [id number]  PCP:  [name], MD  [facility] Cardiologist:  Kate Sable, MD  [facility] Electrophysiologist:  Vickie Epley, MD   Referring MD: Kate Sable, MD   Chief Complaint: Atrial flutter  History of Present Illness:    Adam Holloway is a 50 y.o. male who presents for an evaluation of atrial flutter at the request of Dr Garen Lah. Their medical history includes GERD and tobacco abuse.  He last saw Dr. Garen Lah on August 29, 2020.  At that appointment he complained of fluttering in his chest and shortness of breath.  At that appointment, he reported palpitations and fluttering in the chest that were occurring almost daily.  He had cut down his smoking to 2 cigarettes/day.  He was started on metoprolol succinate 25 mg daily and referred to EP given the very symptomatic nature of his palpitations and fluttering's.  Past Medical History:  Diagnosis Date  . Arthritis    back, left wrist, left knee  . Complication of anesthesia    bp dropped during gallbladder surgery in 2008  . Depression   . Family history of adverse reaction to anesthesia    mom stopped breathing during colonscopy  . GERD (gastroesophageal reflux disease)    h/o  . HA (headache)    h/o migrianes  . Herniated disc, cervical   . History of kidney stones    h/o stones  . History of leukocytosis   . Lumbago   . Pain in soft tissues of limb   . Pancreatitis   . Peripheral neuropathy 04/12/2019  . PTSD (post-traumatic stress disorder)   . Scoliosis     Past Surgical History:  Procedure Laterality Date  . CHOLECYSTECTOMY  2008  . COLONOSCOPY  2010  . COLONOSCOPY WITH PROPOFOL N/A 06/09/2016   Procedure: COLONOSCOPY WITH PROPOFOL;  Surgeon: Christene Lye, MD;  Location: [facility];  Service: Endoscopy;  Laterality: N/A;  . FISSURECTOMY N/A 06/29/2016   Procedure:  FISSURECTOMY;  Surgeon: Christene Lye, MD;  Location: [facility];  Service: General;  Laterality: N/A;  . HEMORRHOID SURGERY N/A 06/29/2016   Procedure: HEMORRHOIDECTOMY;  Surgeon: Christene Lye, MD;  Location: [facility] OR[facility];  Service: General;  Laterality: N/A;  . PAROTIDECTOMY Left 1997  . SALIVARY GLAND SURGERY  1997  . SPHINCTEROTOMY N/A 06/29/2016   Procedure: SPHINCTEROTOMY;  Surgeon: Christene Lye, MD;  Location: [facility] OR[facility];  Service: General;  Laterality: N/A;  . WRIST ARTHROSCOPY Left 01/26/2017   Procedure: ARTHROSCOPY LEFT WRIST DEBRIDEMENT;  Surgeon: Daryll Brod, MD;  Location: [place];  Service: Orthopedics;  Laterality: Left;  . WRIST SURGERY  2019    Current Medications: Current Meds  Medication Sig  . aspirin 81 MG EC tablet Take 81 mg by mouth daily. Swallow whole.  Marland Kitchen atorvastatin (LIPITOR) 20 MG tablet Take 1 tablet (20 mg total) by mouth daily.  . Vitamin D, Ergocalciferol, (DRISDOL) 1.25 MG (50000 UNIT) CAPS capsule Take 50,000 Units by mouth once a week.     Allergies:   Augmentin [amoxicillin-pot clavulanate], Hydrocodone-acetaminophen, Nsaids, Tolmetin, and Meloxicam   Social History   Socioeconomic History  . Marital status: Single    Spouse name: Not on file  . Number of children: 1  . Years of education: GED  . Highest education level: Not on file  Occupational History    Comment: Disabled  Tobacco  Use  . Smoking status: Current Every Day Smoker    Packs/day: 0.50    Years: 32.00    Pack years: 16.00    Types: Cigarettes    Last attempt to quit: 01/16/2019    Years since quitting: 1.7  . Smokeless tobacco: Former Systems developer  . Tobacco comment: Quit 2020 and restarted 10/2019 1-2 cig/day  Vaping Use  . Vaping Use: Never used  Substance and Sexual Activity  . Alcohol use: No    Alcohol/week: 0.0 standard drinks  . Drug use: No  . Sexual activity: Not on file  Other Topics Concern  . Not on file  Social History Narrative    Patient lives at home with his mother.   Disabled.   Education GED.   Right handed.   Caffeine two cups of coffee daily.   Social Determinants of Health   Financial Resource Strain: Not on file  Food Insecurity: Not on file  Transportation Needs: Not on file  Physical Activity: Not on file  Stress: Not on file  Social Connections: Not on file     Family History: The patient's family history includes Cancer in his father; Colon cancer in his mother; Heart Problems in his father; Liver cancer in his brother; Lung cancer in his father.  ROS:   Please see the history of present illness.    All other systems reviewed and are negative.  EKGs/Labs/Other Studies Reviewed:    The following studies were reviewed today:  07/04/2020 Holter personally reviewed  12 min atrial flutter detected Rare PACs and PVCs There are other strips recorded with a ventricular rate of approximately 150 bpm that appear to be 2-1 atrial flutter.  These were labeled as sinus rhythm during the telemetry so I suspect the overall burden of atrial flutter is significantly higher than the monitor would suggest.     08/20/2020 Echo personally reviewed EF 45% RV normal No significant valvular abnormalities   EKG:  The ekg ordered today demonstrates normal sinus rhythm  Recent Labs: 07/23/2020: ALT 27; BUN 14; Creatinine, Ser 1.01; Potassium 4.2; Sodium 140 08/20/2020: Hemoglobin 14.6; Platelets 176  Recent Lipid Panel    Component Value Date/Time   CHOL 193 08/20/2020 1005   TRIG 96 08/20/2020 1005   HDL 44 08/20/2020 1005   CHOLHDL 4.4 08/20/2020 1005   VLDL 19 08/20/2020 1005   LDLCALC 130 (H) 08/20/2020 1005    Physical Exam:    VS:  There were no vitals taken for this visit.    Wt Readings from Last 3 Encounters:  09/17/20 226 lb (102.5 kg)  08/29/20 226 lb 2 oz (102.6 kg)  07/25/20 223 lb (101.2 kg)     GEN:  Well nourished, well developed in no acute distress HEENT: Normal NECK:  No JVD; No carotid bruits LYMPHATICS: No lymphadenopathy CARDIAC: RRR, no murmurs, rubs, gallops RESPIRATORY:  Clear to auscultation without rales, wheezing or rhonchi  ABDOMEN: Soft, non-tender, non-distended MUSCULOSKELETAL:  No edema; No deformity  SKIN: Warm and dry NEUROLOGIC:  Alert and oriented x 3 PSYCHIATRIC:  Normal affect   ASSESSMENT:    1. Atrial flutter, unspecified type ([place])   2. Coronary artery calcification   3. Smoking    PLAN:    In order of problems listed above:  1. Atrial flutter Patient has symptomatic paroxysmal atrial flutter.  No evidence of atrial fibrillation.  Currently he is not on any anticoagulant and just takes aspirin given the presence of coronary artery disease.  Given the  highly symptomatic nature of his atrial flutter and the high frequency of his episodes, we discussed a rhythm control strategy.  We discussed using antiarrhythmics and/or ablation to manage his heart rhythm.  He would like to avoid long-term antiarrhythmic use and thus has decided to pursue ablation.  The risks, recovery and expected success rates of ablation were discussed in detail during today's visit.  We discussed the possibility of him developing atrial fibrillation in the future after eliminating the atrial flutter.  We will plan on doing the atrial flutter ablation and then implanting a loop recorder for ongoing atrial fibrillation surveillance 1 to 2 months after the ablation.  We will start Eliquis 5 mg twice daily today and continue this up until the day of ablation.  After the ablation, he will continue Eliquis for 1 to 2 months until I see him in follow-up.  At that appointment, we will implant a loop recorder for ongoing heart rhythm monitoring and discontinue Eliquis as long as he has not had recurrence of arrhythmias in the postop phase.  Risk, benefits, and alternatives to EP study and radiofrequency ablation for atrial flutter were also discussed in detail today. These  risks include but are not limited to stroke, bleeding, vascular damage, tamponade, perforation, damage to the esophagus, lungs, and other structures, pulmonary vein stenosis, worsening renal function, and death. The patient understands these risk and wishes to proceed.  We will therefore proceed with catheter ablation at the next available time.  Carto, ICE, anesthesia are requested for the procedure.     2. coronary artery disease Coronary artery calcium noted on CT coronary  3.  Tobacco abuse      Medication Adjustments/Labs and Tests Ordered: Current medicines are reviewed at length with the patient today.  Concerns regarding medicines are outlined above.  No orders of the defined types were placed in this encounter.  No orders of the defined types were placed in this encounter.    Signed, Lars Mage, MD, [facility]  10/02/2020 9:26 AM    Electrophysiology [place]

## 2020-10-14 ENCOUNTER — Encounter: Payer: Self-pay | Admitting: Orthopaedic Surgery

## 2020-10-14 NOTE — Telephone Encounter (Signed)
MRI left elbow -pain at lateral epicondyle

## 2020-10-15 ENCOUNTER — Other Ambulatory Visit: Payer: Self-pay

## 2020-10-15 DIAGNOSIS — M25522 Pain in left elbow: Secondary | ICD-10-CM

## 2020-10-25 ENCOUNTER — Encounter: Payer: Self-pay | Admitting: *Deleted

## 2020-10-29 ENCOUNTER — Encounter: Payer: Self-pay | Admitting: Internal Medicine

## 2020-10-29 ENCOUNTER — Ambulatory Visit (INDEPENDENT_AMBULATORY_CARE_PROVIDER_SITE_OTHER): Payer: Medicare Other | Admitting: Internal Medicine

## 2020-10-29 ENCOUNTER — Other Ambulatory Visit: Payer: Self-pay

## 2020-10-29 VITALS — BP 125/87 | HR 82 | Ht 73.0 in | Wt 228.1 lb

## 2020-10-29 DIAGNOSIS — I484 Atypical atrial flutter: Secondary | ICD-10-CM | POA: Diagnosis not present

## 2020-10-29 DIAGNOSIS — G629 Polyneuropathy, unspecified: Secondary | ICD-10-CM

## 2020-10-29 DIAGNOSIS — K219 Gastro-esophageal reflux disease without esophagitis: Secondary | ICD-10-CM | POA: Diagnosis not present

## 2020-10-29 DIAGNOSIS — F419 Anxiety disorder, unspecified: Secondary | ICD-10-CM | POA: Diagnosis not present

## 2020-10-29 NOTE — Progress Notes (Addendum)
Established Patient Office Visit  Subjective:  Patient ID: Adam Holloway, male    DOB: 06/22/1971  Age: 50 y.o. MRN: [id number]  CC:  Chief Complaint  Patient presents with  . Anxiety    Medication refill     HPI patient was found to have atrial flutter and is planning to have ablation done by Dr. Quentin Ore.  He is known to have reflux problem he also reported palpitation.  Patient stated that he is trying to quit smoking.  He does not drink.  He does not take his codeine preparation anymore.  He could not tolerate the beta-blocker  Adam Holloway presents for check up/ pt is getting  Ablation done  , by Dr Quentin Ore  Past Medical History:  Diagnosis Date  . Arthritis    back, left wrist, left knee  . Complication of anesthesia    bp dropped during gallbladder surgery in 2008  . Depression   . Family history of adverse reaction to anesthesia    mom stopped breathing during colonscopy  . GERD (gastroesophageal reflux disease)    h/o  . HA (headache)    h/o migrianes  . Herniated disc, cervical   . History of kidney stones    h/o stones  . History of leukocytosis   . Lumbago   . Pain in soft tissues of limb   . Pancreatitis   . Peripheral neuropathy 04/12/2019  . PTSD (post-traumatic stress disorder)   . Scoliosis     Past Surgical History:  Procedure Laterality Date  . CHOLECYSTECTOMY  2008  . COLONOSCOPY  2010  . COLONOSCOPY WITH PROPOFOL N/A 06/09/2016   Procedure: COLONOSCOPY WITH PROPOFOL;  Surgeon: Christene Lye, MD;  Location: [facility];  Service: Endoscopy;  Laterality: N/A;  . FISSURECTOMY N/A 06/29/2016   Procedure: FISSURECTOMY;  Surgeon: Christene Lye, MD;  Location: [facility];  Service: General;  Laterality: N/A;  . HEMORRHOID SURGERY N/A 06/29/2016   Procedure: HEMORRHOIDECTOMY;  Surgeon: Christene Lye, MD;  Location: [facility];  Service: General;  Laterality: N/A;  . PAROTIDECTOMY Left 1997  . SALIVARY GLAND SURGERY  1997   . SPHINCTEROTOMY N/A 06/29/2016   Procedure: SPHINCTEROTOMY;  Surgeon: Christene Lye, MD;  Location: [facility];  Service: General;  Laterality: N/A;  . WRIST ARTHROSCOPY Left 01/26/2017   Procedure: ARTHROSCOPY LEFT WRIST DEBRIDEMENT;  Surgeon: Daryll Brod, MD;  Location: [place];  Service: Orthopedics;  Laterality: Left;  . WRIST SURGERY  2019    Family History  Problem Relation Age of Onset  . Cancer Father   . Heart Problems Father   . Lung cancer Father   . Colon cancer Mother   . Liver cancer Brother     Social History   Socioeconomic History  . Marital status: Single    Spouse name: Not on file  . Number of children: 1  . Years of education: GED  . Highest education level: Not on file  Occupational History    Comment: Disabled  Tobacco Use  . Smoking status: Current Every Day Smoker    Packs/day: 0.50    Years: 32.00    Pack years: 16.00    Types: Cigarettes    Last attempt to quit: 01/16/2019    Years since quitting: 1.8  . Smokeless tobacco: Former Systems developer  . Tobacco comment: Quit 2020 and restarted 10/2019 1-2 cig/day  Vaping Use  . Vaping Use: Never used  Substance and Sexual Activity  . Alcohol  use: No    Alcohol/week: 0.0 standard drinks  . Drug use: No  . Sexual activity: Not on file  Other Topics Concern  . Not on file  Social History Narrative   Patient lives at home with his mother.   Disabled.   Education GED.   Right handed.   Caffeine two cups of coffee daily.   Social Determinants of Health   Financial Resource Strain: Not on file  Food Insecurity: Not on file  Transportation Needs: Not on file  Physical Activity: Not on file  Stress: Not on file  Social Connections: Not on file  Intimate Partner Violence: Not on file     Current Outpatient Medications:  .  apixaban (ELIQUIS) 5 MG TABS tablet, Take 1 tablet (5 mg total) by mouth 2 (two) times daily., Disp: 60 tablet, Rfl: 6 .  aspirin 81 MG EC tablet, Take 81 mg  by mouth daily. Swallow whole., Disp: , Rfl:  .  atorvastatin (LIPITOR) 20 MG tablet, Take 1 tablet (20 mg total) by mouth daily., Disp: 30 tablet, Rfl: 5 .  diazepam (VALIUM) 5 MG tablet, Take 5 mg by mouth as needed., Disp: , Rfl:  .  Vitamin D, Ergocalciferol, (DRISDOL) 1.25 MG (50000 UNIT) CAPS capsule, Take 50,000 Units by mouth every Wednesday., Disp: , Rfl:  .  acetaminophen (TYLENOL) 325 MG tablet, Take 325 mg by mouth every 6 (six) hours as needed for moderate pain., Disp: , Rfl:  .  calcium carbonate (TUMS - DOSED IN MG ELEMENTAL CALCIUM) 500 MG chewable tablet, Chew 500 mg by mouth daily as needed for indigestion or heartburn., Disp: , Rfl:  .  Oxycodone HCl 10 MG TABS, Take 5 mg by mouth 4 (four) times daily as needed for pain., Disp: , Rfl:    Allergies  Allergen Reactions  . Augmentin [Amoxicillin-Pot Clavulanate] Anaphylaxis    Liver failure   . Atorvastatin     Muscle and joint pain  . Nsaids Other (See Comments)    Chest pain  . Tolmetin Other (See Comments)    Chest pain  . Meloxicam Other (See Comments)    Chest pain    ROS Review of Systems  Constitutional: Negative.   HENT: Negative.   Eyes: Negative.   Respiratory: Negative.   Cardiovascular: Negative.   Gastrointestinal: Negative.   Endocrine: Negative.   Genitourinary: Negative.   Musculoskeletal: Negative.   Skin: Negative.   Allergic/Immunologic: Negative.   Neurological: Negative.   Hematological: Negative.   Psychiatric/Behavioral: Negative.   All other systems reviewed and are negative.     Objective:    Physical Exam Vitals reviewed.  Constitutional:      Appearance: Normal appearance.  HENT:     Mouth/Throat:     Mouth: Mucous membranes are moist.  Eyes:     Pupils: Pupils are equal, round, and reactive to light.  Neck:     Vascular: No carotid bruit.  Cardiovascular:     Rate and Rhythm: Normal rate and regular rhythm.     Pulses: Normal pulses.     Heart sounds: Normal heart  sounds.  Pulmonary:     Effort: Pulmonary effort is normal.     Breath sounds: Normal breath sounds.  Abdominal:     General: Bowel sounds are normal.     Palpations: Abdomen is soft. There is no hepatomegaly, splenomegaly or mass.     Tenderness: There is no abdominal tenderness.     Hernia: No hernia is present.  Musculoskeletal:     Cervical back: Neck supple.     Right lower leg: No edema.     Left lower leg: No edema.  Skin:    Findings: No rash.  Neurological:     Mental Status: He is alert and oriented to person, place, and time.     Motor: No weakness.  Psychiatric:        Mood and Affect: Mood normal.        Behavior: Behavior normal.     BP 125/87   Pulse 82   Ht 6\' 1"  (1.854 m)   Wt 228 lb 1.6 oz (103.5 kg)   BMI 30.09 kg/m  Wt Readings from Last 3 Encounters:  11/05/20 226 lb (102.5 kg)  11/05/20 226 lb 8.4 oz (102.7 kg)  10/29/20 228 lb 1.6 oz (103.5 kg)     Health Maintenance Due  Topic Date Due  . COVID-19 Vaccine (1) Never done  . TETANUS/TDAP  Never done  . INFLUENZA VACCINE  04/14/2020    There are no preventive care reminders to display for this patient.  Lab Results  Component Value Date   TSH 1.670 04/12/2019   Lab Results  Component Value Date   WBC 9.1 11/05/2020   HGB 14.5 11/05/2020   HCT 43.7 11/05/2020   MCV 89.9 11/05/2020   PLT 170 11/05/2020   Lab Results  Component Value Date   NA 140 07/23/2020   K 4.2 07/23/2020   CO2 25 07/23/2020   GLUCOSE 88 07/23/2020   BUN 14 07/23/2020   CREATININE 1.01 07/23/2020   BILITOT 0.4 07/23/2020   ALKPHOS 76 07/23/2020   AST 20 07/23/2020   ALT 27 07/23/2020   PROT 7.4 07/23/2020   ALBUMIN 4.2 07/23/2020   CALCIUM 9.1 07/23/2020   ANIONGAP 12 07/23/2020   Lab Results  Component Value Date   CHOL 193 08/20/2020   Lab Results  Component Value Date   HDL 44 08/20/2020   Lab Results  Component Value Date   LDLCALC 130 (H) 08/20/2020   Lab Results  Component Value Date    TRIG 96 08/20/2020   Lab Results  Component Value Date   CHOLHDL 4.4 08/20/2020   Lab Results  Component Value Date   HGBA1C 5.7 (H) 04/12/2019      Assessment & Plan:   Problem List Items Addressed This Visit      Cardiovascular and Mediastinum   Atypical atrial flutter ([place])    Patient was found to have intermittent atrial flutter fibrillation is going to have ablation done by Dr. Quentin Ore        Digestive   Gastroesophageal reflux disease without esophagitis    GERD Paitent complains of heartburn. This has been associated with belching.  He denies belching. Symptoms have been present for 9 month. He denies dysphagia. He has not lost weight. He denies melena, hematochezia, hematemesis, and coffee ground emesis. Medical therapy in the past has included proton pump inhibitors.         Nervous and Auditory   Neuropathy - Primary    Patient was advised to continue taking his medications         Other   Anxiety   Relevant Medications   diazepam (VALIUM) 5 MG tablet      No orders of the defined types were placed in this encounter.   Follow-up: No follow-ups on file.    [name], MD

## 2020-10-29 NOTE — Assessment & Plan Note (Signed)
Patient was advised to continue taking his medications

## 2020-10-29 NOTE — Assessment & Plan Note (Signed)
Patient was found to have intermittent atrial flutter fibrillation is going to have ablation done by Dr. Quentin Ore

## 2020-10-29 NOTE — Assessment & Plan Note (Signed)
GERD Paitent complains of heartburn. This has been associated with belching.  He denies belching. Symptoms have been present for 9 month. He denies dysphagia. He has not lost weight. He denies melena, hematochezia, hematemesis, and coffee ground emesis. Medical therapy in the past has included proton pump inhibitors.

## 2020-11-02 ENCOUNTER — Ambulatory Visit
Admission: RE | Admit: 2020-11-02 | Discharge: 2020-11-02 | Disposition: A | Discharge status: Home / Self Care | Payer: Medicare Other | Source: Ambulatory Visit | Attending: Orthopaedic Surgery | Admitting: Orthopaedic Surgery

## 2020-11-02 ENCOUNTER — Other Ambulatory Visit: Payer: Self-pay

## 2020-11-02 DIAGNOSIS — M25522 Pain in left elbow: Secondary | ICD-10-CM

## 2020-11-04 NOTE — Progress Notes (Signed)
[facility]  [address] [place], [place] [address] Phone: [phone]  Fax: [phone]   Clinic Day: 11/05/20  Referring physician: [name], MD  Chief Complaint: Adam Holloway is a 50 y.o. male with leukocytosis and weight loss who is seen for 3 month assessment.  HPI: The patient was last seen in the hematology clinic on 07/23/2020. At that time, he had a multitude of concerns.  He had some nasal drainage.  He denied recent steroids.  Exam was stable. Hematocrit was 44.9, hemoglobin 14.9, platelets 170,000, WBC 11,300. CMP was normal. BCR-ABL was negative.  Peripheral smear revealed absolute leukocytosis, with unremarkable differential and morphology. There were no circulating blasts identified, and no significant left shift in maturation. RBC parameters and morphology were normal. Platelet count and morphology were normal.  Head MRI without contrast on 06/23/2020 revealed few scattered T2/FLAIR hyperintense foci in the subcortical deep white matter of the hemispheres.  This was a nonspecific finding but most likely represented very minimal chronic microvascular ischemic change. None of the foci appeared to be acute. There were no acute findings.  Cardiac monitor resulted on 07/04/2020 revealed a min HR of 50 bpm and max HR of 160 bpm. The predominant underlying rhythm was sinus rhythm. Patient had an episode of atrial flutter on 06/18/20 lasting ~ 12 minutes; average rate 80 bpm. Isolated PACs were rare (<1.0%). Isolated PVCs were rare (<1.0%). Trigger events with associated with sinus rhythm or sinus tachycardia. Atrial Flutter found on monitor with less than 1% burden.  The patient saw Dr. Quentin Ore on 10/02/2020. He had symptomatic paroxysmal atrial flutter with no evidence of atrial fibrillation. He was started on Eliquis 5 mg BID. Patient is scheduled for A-flutter ablation on 11/11/2020. They discussed plans for follow-up 1-2 months after  ablation to implant a loop recorder for ongoing heart monitoring. Eliquis will likely be discontinued at that time.  CBC on 08/20/2020 revealed a hematocrit of 43.7, hemoglobin 14.6, platelets 176,000, WBC 9,900 with an Chauncey of 6800.  During the interim, he has been "tired" due to his blood thinner. His appetite is "okay." His atrial flutter is triggered by eating, so he has started eating a bit less. His goal weight is between 180-200. He denies any bruising or bleeding on Eliquis.  His migraines, sinus drainage, and throat discomfort have resolved. His vision has improved. His neuropathy goes away when his heart is not fluttering. He is under stress because he does not get along with his sister. He has tendonitis in his left elbow.  He is down to 2 cigarettes daily.   Past Medical History:  Diagnosis Date  . Arthritis    back, left wrist, left knee  . Complication of anesthesia    bp dropped during gallbladder surgery in 2008  . Depression   . Family history of adverse reaction to anesthesia    mom stopped breathing during colonscopy  . GERD (gastroesophageal reflux disease)    h/o  . HA (headache)    h/o migrianes  . Herniated disc, cervical   . History of kidney stones    h/o stones  . History of leukocytosis   . Lumbago   . Pain in soft tissues of limb   . Pancreatitis   . Peripheral neuropathy 04/12/2019  . PTSD (post-traumatic stress disorder)   . Scoliosis     Past Surgical History:  Procedure Laterality Date  . CHOLECYSTECTOMY  2008  . COLONOSCOPY  2010  . COLONOSCOPY WITH  PROPOFOL N/A 06/09/2016   Procedure: COLONOSCOPY WITH PROPOFOL;  Surgeon: Christene Lye, MD;  Location: [facility];  Service: Endoscopy;  Laterality: N/A;  . FISSURECTOMY N/A 06/29/2016   Procedure: FISSURECTOMY;  Surgeon: Christene Lye, MD;  Location: [facility];  Service: General;  Laterality: N/A;  . HEMORRHOID SURGERY N/A 06/29/2016   Procedure: HEMORRHOIDECTOMY;  Surgeon:  Christene Lye, MD;  Location: [facility] OR[facility];  Service: General;  Laterality: N/A;  . PAROTIDECTOMY Left 1997  . SALIVARY GLAND SURGERY  1997  . SPHINCTEROTOMY N/A 06/29/2016   Procedure: SPHINCTEROTOMY;  Surgeon: Christene Lye, MD;  Location: [facility] OR[facility];  Service: General;  Laterality: N/A;  . WRIST ARTHROSCOPY Left 01/26/2017   Procedure: ARTHROSCOPY LEFT WRIST DEBRIDEMENT;  Surgeon: Daryll Brod, MD;  Location: [facility];  Service: Orthopedics;  Laterality: Left;  . WRIST SURGERY  2019    Family History  Problem Relation Age of Onset  . Cancer Father   . Heart Problems Father   . Lung cancer Father   . Colon cancer Mother   . Liver cancer Brother     Social History:  reports that he has been smoking cigarettes. He has a 16.00 pack-year smoking history. He has quit using smokeless tobacco. He reports that he does not drink alcohol and does not use drugs. He does not drink alcohol, and stopped in 2006. He smoked a pack a day for 30 years until 01/16/2019. He is currently smoking 1-2 cigarettes daily. He denies any known exposure to radiation or toxins. He does not currently work, but previously worked in Psychologist, educational and for a Engineer, civil (consulting). He is on disability for his back issues and PTSD. The patient is alone today.  Allergies:  Allergies  Allergen Reactions  . Augmentin [Amoxicillin-Pot Clavulanate] Anaphylaxis    Liver failure   . Atorvastatin     Muscle and joint pain  . Nsaids Other (See Comments)    Chest pain  . Tolmetin Other (See Comments)    Chest pain  . Meloxicam Other (See Comments)    Chest pain    Current Medications: Current Outpatient Medications  Medication Sig Dispense Refill  . acetaminophen (TYLENOL) 325 MG tablet Take 325 mg by mouth every 6 (six) hours as needed for moderate pain.    Marland Kitchen apixaban (ELIQUIS) 5 MG TABS tablet Take 1 tablet (5 mg total) by mouth 2 (two) times daily. 60 tablet 6  . aspirin 81 MG EC tablet Take 81  mg by mouth daily. Swallow whole.    . calcium carbonate (TUMS - DOSED IN MG ELEMENTAL CALCIUM) 500 MG chewable tablet Chew 500 mg by mouth daily as needed for indigestion or heartburn.    . diazepam (VALIUM) 5 MG tablet Take 5 mg by mouth as needed.    . Oxycodone HCl 10 MG TABS Take 5 mg by mouth 4 (four) times daily as needed for pain.    . Vitamin D, Ergocalciferol, (DRISDOL) 1.25 MG (50000 UNIT) CAPS capsule Take 50,000 Units by mouth every Wednesday.    Marland Kitchen atorvastatin (LIPITOR) 20 MG tablet Take 1 tablet (20 mg total) by mouth daily. (Patient not taking: No sig reported) 30 tablet 5   No current facility-administered medications for this visit.    Review of Systems  Constitutional: Negative for chills, diaphoresis, fever, malaise/fatigue and weight loss (up 2 lbs).  HENT: Negative for congestion, ear discharge, ear pain, hearing loss, nosebleeds, sinus pain, sore throat and tinnitus.   Eyes: Negative  for blurred vision, double vision, photophobia and pain.  Respiratory: Negative.  Negative for cough, hemoptysis, sputum production and shortness of breath.   Cardiovascular: Positive for palpitations. Negative for chest pain, leg swelling and PND.       Episodes of atrial flutter.  Gastrointestinal: Negative.  Negative for abdominal pain, blood in stool, constipation, diarrhea, heartburn, nausea and vomiting.  Genitourinary: Negative.  Negative for dysuria, frequency, hematuria and urgency.  Musculoskeletal: Positive for joint pain (left elbow tendonitis). Negative for myalgias and neck pain.  Skin: Negative.  Negative for itching and rash.  Neurological: Positive for sensory change (neuropathy). Negative for dizziness, tingling, tremors, speech change, focal weakness, weakness and headaches.  Endo/Heme/Allergies: Negative.  Does not bruise/bleed easily.  Psychiatric/Behavioral: Negative for depression and memory loss. The patient is nervous/anxious (stress). The patient does not have  insomnia.   All other systems reviewed and are negative.   Performance status (ECOG):  1  Vital Signs: Blood pressure 115/88, pulse 79, temperature (!) 96.5 F (35.8 C), temperature source Tympanic, weight 226 lb 8.4 oz (102.7 kg), SpO2 100 %.  Physical Exam Vitals and nursing note reviewed.  Constitutional:      General: He is not in acute distress.    Appearance: Normal appearance. He is well-developed. He is not ill-appearing, toxic-appearing or diaphoretic.  HENT:     Head: Normocephalic and atraumatic.     Comments: Short brown hair.    Mouth/Throat:     Mouth: Mucous membranes are moist.     Pharynx: Oropharynx is clear.  Eyes:     General: No scleral icterus.    Conjunctiva/sclera: Conjunctivae normal.     Pupils: Pupils are equal, round, and reactive to light.     Comments: Blue eyes.  Cardiovascular:     Rate and Rhythm: Normal rate and regular rhythm.     Heart sounds: Normal heart sounds. No murmur heard.   Pulmonary:     Effort: Pulmonary effort is normal. No respiratory distress.     Breath sounds: Normal breath sounds. No wheezing or rales.  Chest:     Chest wall: No tenderness.  Breasts:     Right: No axillary adenopathy or supraclavicular adenopathy.     Left: No axillary adenopathy or supraclavicular adenopathy.    Abdominal:     General: Bowel sounds are normal. There is no distension.     Palpations: Abdomen is soft. There is no hepatomegaly, splenomegaly or mass.     Tenderness: There is no abdominal tenderness. There is no guarding or rebound.  Musculoskeletal:        General: No swelling. Normal range of motion.     Cervical back: Normal range of motion and neck supple. No rigidity or tenderness.  Lymphadenopathy:     Head:     Right side of head: No preauricular, posterior auricular or occipital adenopathy.     Left side of head: No preauricular, posterior auricular or occipital adenopathy.     Cervical: No cervical adenopathy.     Upper  Body:     Right upper body: No supraclavicular or axillary adenopathy.     Left upper body: No supraclavicular or axillary adenopathy.     Lower Body: No right inguinal adenopathy. No left inguinal adenopathy.  Skin:    General: Skin is warm.     Coloration: Skin is not jaundiced or pale.     Findings: No bruising or erythema.  Neurological:     Mental Status: He is alert  and oriented to person, place, and time. Mental status is at baseline.  Psychiatric:        Mood and Affect: Mood normal.        Behavior: Behavior normal.        Thought Content: Thought content normal.        Judgment: Judgment normal.    Appointment on 11/05/2020  Component Date Value Ref Range Status  . WBC 11/05/2020 9.1  4.0 - 10.5 K/uL Final  . RBC 11/05/2020 4.86  4.22 - 5.81 MIL/uL Final  . Hemoglobin 11/05/2020 14.5  13.0 - 17.0 g/dL Final  . HCT 11/05/2020 43.7  39.0 - 52.0 % Final  . MCV 11/05/2020 89.9  80.0 - 100.0 fL Final  . MCH 11/05/2020 29.8  26.0 - 34.0 pg Final  . MCHC 11/05/2020 33.2  30.0 - 36.0 g/dL Final  . RDW 11/05/2020 14.1  11.5 - 15.5 % Final  . Platelets 11/05/2020 170  150 - 400 K/uL Final  . nRBC 11/05/2020 0.0  0.0 - 0.2 % Final  . Neutrophils Relative % 11/05/2020 66  % Final  . Neutro Abs 11/05/2020 5.9  1.7 - 7.7 K/uL Final  . Lymphocytes Relative 11/05/2020 27  % Final  . Lymphs Abs 11/05/2020 2.5  0.7 - 4.0 K/uL Final  . Monocytes Relative 11/05/2020 6  % Final  . Monocytes Absolute 11/05/2020 0.5  0.1 - 1.0 K/uL Final  . Eosinophils Relative 11/05/2020 1  % Final  . Eosinophils Absolute 11/05/2020 0.1  0.0 - 0.5 K/uL Final  . Basophils Relative 11/05/2020 0  % Final  . Basophils Absolute 11/05/2020 0.0  0.0 - 0.1 K/uL Final  . Immature Granulocytes 11/05/2020 0  % Final  . Abs Immature Granulocytes 11/05/2020 0.02  0.00 - 0.07 K/uL Final   Performed at [facility], [address], [place], [place] [address]    Assessment:  ARASH KARSTENS is a 50  y.o. male with mild leukocytosis felt reactive.  WBC has ranged between 11,300 - 16,700 in the past 2 months.  Work-up on 01/19/2019 revealed the following normal labs:  hepatitis, hepatitis B core total antibody, hepatitis C antibody, HIV testing, Burgdorferi antibodies, ANA, ACE, rheumatoid factor, RPR, sed rate, amylase, and lipase.  ALT was 48 (0-44).  B12 was 341 (low normal).   Work-up on 02/08/2019 revealed a hematocrit of 46.3, hemoglobin 15.5, MCV 92.6, platelets 195,000, WBC 10,700 with an ANC of 7100.  Differential was unremarkable.  CRP was < 0.8 (normal).  BCR-ABL was negative.    Peripheral smear revealed persistent mild leukocytosis. There was mild non specific leukocytosis with mature neutrophils and lymphocytes. There was occasional reactive appearing lymphocytes. There was no shift in immaturity. RBC morphology was unremarkable.  Platelets appeared mildly increased in size.  Overall the morphologic findings were not specific and are likely reactive to another process. A hematologic neoplastic process was not favored.   He has a 30 pack year smoking history.  Chest CT on 02/15/2019 revealed no acute cardiopulmonary disease.  There were scattered coronary artery calcifications, mild bilateral gynecomastia, and aortic atherosclerosis.  PET scan on 06/06/2020 revealed no significant hypermetabolic lesion is identified to suggest hypermetabolic malignancy in the setting of the patient's unintentional weight loss.  Symptomatically, he feels "tired". Appetite is "ok."  Atrial flutter is triggered by eating, so he has started eating a bit less. He denies any bruising or bleeding on Eliquis.  WBC is 9,100.  Plan: 1.  Labs today: CBC with diff. 2.   Mild leukocytosis  WBC is normal with an unremarkable differential.  He has had reactive leukocytosis.    PET scan on 06/06/2020 revealed no evidence of metastatic disease.  Reassurance provided. 3.   Weight loss, resolved  He has gained  2 pounds.  Weight loss may be secondary to stress.  He also notes decreased eating secondary to atrial fibrillation. 4.   Smoking  Patient has decreased his smoking to 2 cigarettes a day.    Encourage ongoing smoking cessation. 5.   RTC prn.  I discussed the assessment and treatment plan with the patient.  The patient was provided an opportunity to ask questions and all were answered.  The patient agreed with the plan and demonstrated an understanding of the instructions.  The patient was advised to call back or seek an in person evaluation if the symptoms worsen or if the condition fails to improve as anticipated.   Lequita Asal, MD, PhD 11/05/2020, 10:41 AM   I, De Burrs, am acting as scribe for [employer]. Mike Gip, MD, PhD.  I, Alverna Fawley C. Mike Gip, MD, have reviewed the above documentation for accuracy and completeness, and I agree with the above.

## 2020-11-05 ENCOUNTER — Inpatient Hospital Stay (HOSPITAL_BASED_OUTPATIENT_CLINIC_OR_DEPARTMENT_OTHER): Payer: Medicare Other | Admitting: Hematology and Oncology

## 2020-11-05 ENCOUNTER — Encounter: Payer: Self-pay | Admitting: Hematology and Oncology

## 2020-11-05 ENCOUNTER — Encounter: Payer: Self-pay | Admitting: Orthopaedic Surgery

## 2020-11-05 ENCOUNTER — Other Ambulatory Visit: Payer: Self-pay

## 2020-11-05 ENCOUNTER — Ambulatory Visit (INDEPENDENT_AMBULATORY_CARE_PROVIDER_SITE_OTHER): Payer: Medicare Other | Admitting: Orthopaedic Surgery

## 2020-11-05 ENCOUNTER — Inpatient Hospital Stay: Payer: Medicare Other | Attending: Hematology and Oncology

## 2020-11-05 VITALS — Ht 73.0 in | Wt 226.0 lb

## 2020-11-05 VITALS — BP 115/88 | HR 79 | Temp 96.5°F | Wt 226.5 lb

## 2020-11-05 DIAGNOSIS — M7712 Lateral epicondylitis, left elbow: Secondary | ICD-10-CM

## 2020-11-05 DIAGNOSIS — F17210 Nicotine dependence, cigarettes, uncomplicated: Secondary | ICD-10-CM | POA: Insufficient documentation

## 2020-11-05 DIAGNOSIS — D72829 Elevated white blood cell count, unspecified: Secondary | ICD-10-CM

## 2020-11-05 DIAGNOSIS — I4891 Unspecified atrial fibrillation: Secondary | ICD-10-CM | POA: Diagnosis not present

## 2020-11-05 DIAGNOSIS — Z801 Family history of malignant neoplasm of trachea, bronchus and lung: Secondary | ICD-10-CM | POA: Insufficient documentation

## 2020-11-05 DIAGNOSIS — I2584 Coronary atherosclerosis due to calcified coronary lesion: Secondary | ICD-10-CM

## 2020-11-05 DIAGNOSIS — Z7901 Long term (current) use of anticoagulants: Secondary | ICD-10-CM | POA: Diagnosis not present

## 2020-11-05 DIAGNOSIS — Z800 Family history of malignant neoplasm of digestive organs: Secondary | ICD-10-CM | POA: Diagnosis not present

## 2020-11-05 DIAGNOSIS — Z7982 Long term (current) use of aspirin: Secondary | ICD-10-CM | POA: Diagnosis not present

## 2020-11-05 DIAGNOSIS — I2510 Atherosclerotic heart disease of native coronary artery without angina pectoris: Secondary | ICD-10-CM

## 2020-11-05 DIAGNOSIS — Z79899 Other long term (current) drug therapy: Secondary | ICD-10-CM | POA: Diagnosis not present

## 2020-11-05 DIAGNOSIS — M25522 Pain in left elbow: Secondary | ICD-10-CM

## 2020-11-05 LAB — CBC WITH DIFFERENTIAL/PLATELET
Abs Immature Granulocytes: 0.02 10*3/uL (ref 0.00–0.07)
Basophils Absolute: 0 10*3/uL (ref 0.0–0.1)
Basophils Relative: 0 %
Eosinophils Absolute: 0.1 10*3/uL (ref 0.0–0.5)
Eosinophils Relative: 1 %
HCT: 43.7 % (ref 39.0–52.0)
Hemoglobin: 14.5 g/dL (ref 13.0–17.0)
Immature Granulocytes: 0 %
Lymphocytes Relative: 27 %
Lymphs Abs: 2.5 10*3/uL (ref 0.7–4.0)
MCH: 29.8 pg (ref 26.0–34.0)
MCHC: 33.2 g/dL (ref 30.0–36.0)
MCV: 89.9 fL (ref 80.0–100.0)
Monocytes Absolute: 0.5 10*3/uL (ref 0.1–1.0)
Monocytes Relative: 6 %
Neutro Abs: 5.9 10*3/uL (ref 1.7–7.7)
Neutrophils Relative %: 66 %
Platelets: 170 10*3/uL (ref 150–400)
RBC: 4.86 MIL/uL (ref 4.22–5.81)
RDW: 14.1 % (ref 11.5–15.5)
WBC: 9.1 10*3/uL (ref 4.0–10.5)
nRBC: 0 % (ref 0.0–0.2)

## 2020-11-05 NOTE — Progress Notes (Signed)
Office Visit Note   Patient: Adam Holloway           Date of Birth: 1971/06/16           MRN: [id number] Visit Date: 11/05/2020              Requested by: [name], MD [address] [place],  [place] [address] PCP: [name], MD   Assessment & Plan: Visit Diagnoses:  1. Left elbow pain   2. Left tennis elbow     Plan: Mr. Wander had an MRI scan of his left elbow revealing some tendinosis of the common flexor and extensor origins at the medial lateral epicondyles.  He occasionally has had some numbness and tingling in his ulnar 2 digits but there is no evidence of any pathology involving the ulnar nerve.  Today he was asymptomatic in terms of ulnar neuritis.  He is mostly tender over the lateral epicondyles.  He is had multiple injections with temporary relief.  He is present being evaluated for atrial flutter and wants to avoid surgery if at all possible.  I discussed physical therapy.  We will try this and see if it helps and if not I think the next best approach would be lateral epicondylar debridement and I discussed this in some detail with him regarding the surgery, rehab, length out of work and potential pain relief.  He will let me know how he wants to proceed if therapy is not helpful  Follow-Up Instructions: Return if symptoms worsen or fail to improve.   Orders:  Orders Placed This Encounter  Procedures  . Ambulatory referral to Physical Therapy   No orders of the defined types were placed in this encounter.     Procedures: No procedures performed   Clinical Data: No additional findings.   Subjective: Chief Complaint  Patient presents with  . Left Elbow - Follow-up    MRI review  Patient presents today for follow up on his left elbow. He had an MRI performed and is here today to go over those results.  No change in symptoms.  Still having some mostly lateral epicondylar pain left elbow.  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  HENT:     Mouth/Throat:     Mouth: Oropharynx is clear and moist.  Eyes:     Extraocular Movements: EOM normal.     Pupils: Pupils are equal, round, and reactive to light.  Pulmonary:     Effort: Pulmonary effort is normal.  Skin:    General: Skin is warm and dry.  Neurological:     Mental Status: He is alert and oriented to person, place, and time.  Psychiatric:        Mood and Affect: Mood and affect normal.        Behavior: Behavior normal.     Ortho Exam left elbow with full range of motion.  He has significant pain with grip mostly in extension versus flexion along the lateral epicondyle where he was also locally tender.  No crepitation.  Very minimal discomfort over the medial epicondyle.  No Tinel's over the ulnar nerve.  Good grip and release.  No sensory changes in the hand distally.  Specialty Comments:  No specialty comments available.  Imaging: No results found.   PMFS History: Patient Active Problem List   Diagnosis Date Noted  . 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) 10/29/2020  . Left tennis elbow 07/10/2020  . Weight loss 06/10/2020  . Vitamin D deficiency 04/11/2020  . Gastroesophageal reflux disease without esophagitis 04/11/2020  . Anxiety 04/11/2020  . Pain in left wrist 12/13/2019  . Pain in left elbow 12/13/2019  . Neuropathy 04/12/2019  . Low vitamin B12 level 02/20/2019  . Leukocytosis 02/12/2019  . Right-sided chest pain 02/12/2019  . Neck pain 12/07/2018  . HA (headache)   . Pain in soft tissues of limb   . Lumbago   . Gynecomastia, male 07/12/2013   Past Medical History:  Diagnosis Date  . Arthritis    back, left wrist, left knee  . Complication of anesthesia    bp dropped during  gallbladder surgery in 2008  . Depression   . Family history of adverse reaction to anesthesia    mom stopped breathing during colonscopy  . GERD (gastroesophageal reflux disease)    h/o  . HA (headache)    h/o migrianes  . Herniated disc, cervical   . History of kidney stones    h/o stones  . History of leukocytosis   . Lumbago   . Pain in soft tissues of limb   . Pancreatitis   . Peripheral neuropathy 04/12/2019  . PTSD (post-traumatic stress disorder)   . Scoliosis     Family History  Problem Relation Age of Onset  . Cancer Father   . Heart Problems Father   . Lung cancer Father   . Colon cancer Mother   . Liver cancer Brother     Past Surgical History:  Procedure Laterality Date  . CHOLECYSTECTOMY  2008  . COLONOSCOPY  2010  . COLONOSCOPY WITH PROPOFOL N/A 06/09/2016   Procedure: COLONOSCOPY WITH PROPOFOL;  Surgeon: Christene Lye, MD;  Location: [facility];  Service: Endoscopy;  Laterality: N/A;  . FISSURECTOMY N/A 06/29/2016   Procedure: FISSURECTOMY;  Surgeon: Christene Lye, MD;  Location: [facility];  Service: General;  Laterality: N/A;  . HEMORRHOID SURGERY N/A 06/29/2016   Procedure: HEMORRHOIDECTOMY;  Surgeon: Christene Lye, MD;  Location: [facility] ORS;  Service: General;  Laterality: N/A;  . PAROTIDECTOMY Left 1997  . SALIVARY GLAND SURGERY  1997  . SPHINCTEROTOMY N/A 06/29/2016   Procedure: SPHINCTEROTOMY;  Surgeon: Christene Lye, MD;  Location: [facility];  Service: General;  Laterality: N/A;  . WRIST ARTHROSCOPY Left 01/26/2017   Procedure: ARTHROSCOPY LEFT WRIST DEBRIDEMENT;  Surgeon: Daryll Brod, MD;  Location: [place];  Service: Orthopedics;  Laterality: Left;  . WRIST SURGERY  2019   Social History   Occupational History    Comment: Disabled  Tobacco Use  . Smoking status: Current Every Day Smoker    Packs/day: 0.50    Years: 32.00    Pack years: 16.00    Types: Cigarettes    Last attempt to quit:  01/16/2019    Years since quitting: 1.8  . Smokeless tobacco: Former Systems developer  . Tobacco comment: Quit 2020 and restarted 10/2019 1-2 cig/day  Vaping Use  . Vaping Use: Never used  Substance and Sexual Activity  . Alcohol use: No    Alcohol/week: 0.0 standard drinks  . Drug use: No  . Sexual activity: Not on file

## 2020-11-08 ENCOUNTER — Other Ambulatory Visit: Payer: Self-pay

## 2020-11-08 ENCOUNTER — Other Ambulatory Visit
Admission: RE | Admit: 2020-11-08 | Discharge: 2020-11-08 | Disposition: A | Discharge status: Home / Self Care | Payer: Medicare Other | Source: Home / Self Care | Attending: Cardiology | Admitting: Cardiology

## 2020-11-08 ENCOUNTER — Other Ambulatory Visit
Admission: RE | Admit: 2020-11-08 | Discharge: 2020-11-08 | Disposition: A | Discharge status: Home / Self Care | Payer: Medicare Other | Source: Ambulatory Visit | Attending: Cardiology | Admitting: Cardiology

## 2020-11-08 DIAGNOSIS — Z20822 Contact with and (suspected) exposure to covid-19: Secondary | ICD-10-CM | POA: Insufficient documentation

## 2020-11-08 DIAGNOSIS — Z01812 Encounter for preprocedural laboratory examination: Secondary | ICD-10-CM | POA: Diagnosis present

## 2020-11-08 LAB — COMPREHENSIVE METABOLIC PANEL
ALT: 26 U/L (ref 0–44)
AST: 20 U/L (ref 15–41)
Albumin: 4.6 g/dL (ref 3.5–5.0)
Alkaline Phosphatase: 69 U/L (ref 38–126)
Anion gap: 8 (ref 5–15)
BUN: 15 mg/dL (ref 6–20)
CO2: 23 mmol/L (ref 22–32)
Calcium: 9.8 mg/dL (ref 8.9–10.3)
Chloride: 106 mmol/L (ref 98–111)
Creatinine, Ser: 1 mg/dL (ref 0.61–1.24)
GFR, Estimated: >60 mL/min (ref >60)
Glucose, Bld: 99 mg/dL (ref 70–99)
Potassium: 4.6 mmol/L (ref 3.5–5.1)
Sodium: 137 mmol/L (ref 135–145)
Total Bilirubin: 0.5 mg/dL (ref 0.3–1.2)
Total Protein: 7.6 g/dL (ref 6.5–8.1)

## 2020-11-08 LAB — CBC
HCT: 46.5 % (ref 39.0–52.0)
Hemoglobin: 15.5 g/dL (ref 13.0–17.0)
MCH: 30.6 pg (ref 26.0–34.0)
MCHC: 33.3 g/dL (ref 30.0–36.0)
MCV: 91.9 fL (ref 80.0–100.0)
Platelets: 195 10*3/uL (ref 150–400)
RBC: 5.06 MIL/uL (ref 4.22–5.81)
RDW: 14.2 % (ref 11.5–15.5)
WBC: 10 10*3/uL (ref 4.0–10.5)
nRBC: 0 % (ref 0.0–0.2)

## 2020-11-08 LAB — SARS CORONAVIRUS 2 (TAT 6-24 HRS): SARS Coronavirus 2: NEGATIVE

## 2020-11-08 NOTE — Progress Notes (Signed)
Attempted to call patient regarding Monday's procedure.  Left voicemail on the following items: Arrival time 0830 Nothing to eat or drink after midnight No meds AM of procedure Responsible person to drive you home and stay with you for 24 hrs  Have you missed any doses of anti-coagulant Eliquis

## 2020-11-11 ENCOUNTER — Ambulatory Visit (HOSPITAL_COMMUNITY): Payer: Medicare Other | Admitting: Certified Registered Nurse Anesthetist

## 2020-11-11 ENCOUNTER — Encounter (HOSPITAL_COMMUNITY)
Admission: RE | Disposition: A | Discharge status: Home / Self Care | Payer: Self-pay | Source: Home / Self Care | Attending: Cardiology

## 2020-11-11 ENCOUNTER — Other Ambulatory Visit: Payer: Self-pay

## 2020-11-11 ENCOUNTER — Encounter (HOSPITAL_COMMUNITY): Payer: Self-pay | Admitting: Cardiology

## 2020-11-11 ENCOUNTER — Other Ambulatory Visit: Payer: Medicare Other

## 2020-11-11 ENCOUNTER — Ambulatory Visit: Payer: Medicare Other | Admitting: Hematology and Oncology

## 2020-11-11 ENCOUNTER — Ambulatory Visit (HOSPITAL_COMMUNITY)
Admission: RE | Admit: 2020-11-11 | Discharge: 2020-11-11 | Disposition: A | Discharge status: Home / Self Care | Payer: Medicare Other | Attending: Cardiology | Admitting: Cardiology

## 2020-11-11 DIAGNOSIS — Z888 Allergy status to other drugs, medicaments and biological substances status: Secondary | ICD-10-CM | POA: Insufficient documentation

## 2020-11-11 DIAGNOSIS — Z885 Allergy status to narcotic agent status: Secondary | ICD-10-CM | POA: Diagnosis not present

## 2020-11-11 DIAGNOSIS — Z881 Allergy status to other antibiotic agents status: Secondary | ICD-10-CM | POA: Insufficient documentation

## 2020-11-11 DIAGNOSIS — I483 Typical atrial flutter: Secondary | ICD-10-CM

## 2020-11-11 DIAGNOSIS — Z886 Allergy status to analgesic agent status: Secondary | ICD-10-CM | POA: Insufficient documentation

## 2020-11-11 DIAGNOSIS — Z79899 Other long term (current) drug therapy: Secondary | ICD-10-CM | POA: Insufficient documentation

## 2020-11-11 DIAGNOSIS — Z880 Allergy status to penicillin: Secondary | ICD-10-CM | POA: Insufficient documentation

## 2020-11-11 DIAGNOSIS — Z7982 Long term (current) use of aspirin: Secondary | ICD-10-CM | POA: Insufficient documentation

## 2020-11-11 DIAGNOSIS — I2510 Atherosclerotic heart disease of native coronary artery without angina pectoris: Secondary | ICD-10-CM | POA: Insufficient documentation

## 2020-11-11 DIAGNOSIS — F17210 Nicotine dependence, cigarettes, uncomplicated: Secondary | ICD-10-CM | POA: Diagnosis not present

## 2020-11-11 HISTORY — PX: A-FLUTTER ABLATION: EP1230

## 2020-11-11 SURGERY — A-FLUTTER ABLATION
Anesthesia: General

## 2020-11-11 MED ORDER — PROPOFOL 10 MG/ML IV BOLUS
INTRAVENOUS | Status: DC | PRN
  Administered 2020-11-11: 200 mg via INTRAVENOUS
  Administered 2020-11-11: 50 mg via INTRAVENOUS

## 2020-11-11 MED ORDER — MIDAZOLAM HCL 2 MG/2ML IJ SOLN
INTRAMUSCULAR | Status: DC | PRN
  Administered 2020-11-11: 2 mg via INTRAVENOUS

## 2020-11-11 MED ORDER — ROCURONIUM BROMIDE 10 MG/ML (PF) SYRINGE
PREFILLED_SYRINGE | INTRAVENOUS | Status: DC | PRN
  Administered 2020-11-11: 60 mg via INTRAVENOUS

## 2020-11-11 MED ORDER — HEPARIN (PORCINE) IN NACL 1000-0.9 UT/500ML-% IV SOLN
INTRAVENOUS | Status: DC | PRN
  Administered 2020-11-11: 500 mL

## 2020-11-11 MED ORDER — LIDOCAINE 2% (20 MG/ML) 5 ML SYRINGE
INTRAMUSCULAR | Status: DC | PRN
  Administered 2020-11-11: 60 mg via INTRAVENOUS

## 2020-11-11 MED ORDER — ACETAMINOPHEN 325 MG PO TABS
650.0000 mg | ORAL_TABLET | Freq: Four times a day (QID) | ORAL | Status: DC | PRN
  Administered 2020-11-11: 650 mg via ORAL

## 2020-11-11 MED ORDER — DEXAMETHASONE SODIUM PHOSPHATE 10 MG/ML IJ SOLN
INTRAMUSCULAR | Status: DC | PRN
  Administered 2020-11-11: 5 mg via INTRAVENOUS

## 2020-11-11 MED ORDER — PHENYLEPHRINE HCL-NACL 10-0.9 MG/250ML-% IV SOLN
INTRAVENOUS | Status: DC | PRN
  Administered 2020-11-11: 40 ug/min via INTRAVENOUS

## 2020-11-11 MED ORDER — PANTOPRAZOLE SODIUM 40 MG PO TBEC: 40.0000 mg | DELAYED_RELEASE_TABLET | Freq: Every day | ORAL | 0 refills | Status: DC

## 2020-11-11 MED ORDER — HEPARIN (PORCINE) IN NACL 1000-0.9 UT/500ML-% IV SOLN
INTRAVENOUS | Status: AC
  Filled 2020-11-11: qty 500

## 2020-11-11 MED ORDER — ONDANSETRON HCL 4 MG/2ML IJ SOLN
INTRAMUSCULAR | Status: DC | PRN
  Administered 2020-11-11: 4 mg via INTRAVENOUS

## 2020-11-11 MED ORDER — SODIUM CHLORIDE 0.9 % IV SOLN: INTRAVENOUS | Status: DC

## 2020-11-11 MED ORDER — ISOPROTERENOL HCL 0.2 MG/ML IJ SOLN
INTRAMUSCULAR | Status: AC
  Filled 2020-11-11: qty 5

## 2020-11-11 MED ORDER — ACETAMINOPHEN 325 MG PO TABS
ORAL_TABLET | ORAL | Status: AC
  Filled 2020-11-11: qty 2

## 2020-11-11 MED ORDER — ONDANSETRON HCL 4 MG/2ML IJ SOLN: 4.0000 mg | Freq: Four times a day (QID) | INTRAMUSCULAR | Status: DC | PRN

## 2020-11-11 MED ORDER — FENTANYL CITRATE (PF) 250 MCG/5ML IJ SOLN
INTRAMUSCULAR | Status: DC | PRN
  Administered 2020-11-11: 100 ug via INTRAVENOUS

## 2020-11-11 MED ORDER — APIXABAN 5 MG PO TABS
5.0000 mg | ORAL_TABLET | Freq: Two times a day (BID) | ORAL | Status: DC
  Administered 2020-11-11: 5 mg via ORAL
  Filled 2020-11-11: qty 1

## 2020-11-11 MED ORDER — ISOPROTERENOL HCL 0.2 MG/ML IJ SOLN
INTRAVENOUS | Status: DC | PRN
  Administered 2020-11-11: 4 ug/min via INTRAVENOUS

## 2020-11-11 MED ORDER — HEPARIN SODIUM (PORCINE) 1000 UNIT/ML IJ SOLN
INTRAMUSCULAR | Status: AC
  Filled 2020-11-11: qty 1

## 2020-11-11 MED ORDER — HEPARIN SODIUM (PORCINE) 1000 UNIT/ML IJ SOLN
INTRAMUSCULAR | Status: DC | PRN
  Administered 2020-11-11: 1000 [IU] via INTRAVENOUS

## 2020-11-11 SURGICAL SUPPLY — 15 items
BLANKET WARM UNDERBOD FULL ACC (MISCELLANEOUS) ×2 IMPLANT
CATH SMTCH THERMOCOOL SF DF (CATHETERS) ×2 IMPLANT
CATH SOUNDSTAR ECO 8FR (CATHETERS) ×2 IMPLANT
CATH WEBSTER BI DIR CS D-F CRV (CATHETERS) ×2 IMPLANT
CLOSURE PERCLOSE PROSTYLE (VASCULAR PRODUCTS) ×6 IMPLANT
MAT PREVALON FULL STRYKER (MISCELLANEOUS) ×2 IMPLANT
PACK EP LATEX FREE (CUSTOM PROCEDURE TRAY) ×1
PACK EP LF (CUSTOM PROCEDURE TRAY) ×1 IMPLANT
PAD PRO RADIOLUCENT 2001M-C (PAD) ×2 IMPLANT
PATCH CARTO3 (PAD) ×2 IMPLANT
SHEATH PINNACLE 7F 10CM (SHEATH) ×2 IMPLANT
SHEATH PINNACLE 8F 10CM (SHEATH) ×2 IMPLANT
SHEATH PINNACLE 9F 10CM (SHEATH) ×2 IMPLANT
SHEATH PROBE COVER 6X72 (BAG) ×2 IMPLANT
TUBING SMART ABLATE COOLFLOW (TUBING) ×2 IMPLANT

## 2020-11-11 NOTE — Anesthesia Preprocedure Evaluation (Signed)
Anesthesia Evaluation  Patient identified by MRN, date of birth, ID band Patient awake    Reviewed: Allergy & Precautions, NPO status , Patient's Chart, lab work & pertinent test results  Airway Mallampati: II  TM Distance: >3 FB Neck ROM: Full    Dental  (+) Dental Advisory Given   Pulmonary Current Smoker,    breath sounds clear to auscultation       Cardiovascular +CHF  + dysrhythmias Atrial Fibrillation  Rhythm:Regular Rate:Normal  EF 45-50%. Global mild hypokinesis. Mild MR.    Neuro/Psych  Neuromuscular disease    GI/Hepatic Neg liver ROS, GERD  ,  Endo/Other  negative endocrine ROS  Renal/GU negative Renal ROS     Musculoskeletal  (+) Arthritis ,   Abdominal   Peds  Hematology negative hematology ROS (+)   Anesthesia Other Findings   Reproductive/Obstetrics                             Anesthesia Physical Anesthesia Plan  ASA: II  Anesthesia Plan: General   Post-op Pain Management:    Induction: Intravenous  PONV Risk Score and Plan: 1 and Dexamethasone, Ondansetron and Treatment may vary due to age or medical condition  Airway Management Planned: Oral ETT  Additional Equipment: None  Intra-op Plan:   Post-operative Plan: Extubation in OR  Informed Consent: I have reviewed the patients History and Physical, chart, labs and discussed the procedure including the risks, benefits and alternatives for the proposed anesthesia with the patient or authorized representative who has indicated his/her understanding and acceptance.     Dental advisory given  Plan Discussed with: CRNA  Anesthesia Plan Comments:         Anesthesia Quick Evaluation

## 2020-11-11 NOTE — Transfer of Care (Signed)
Immediate Anesthesia Transfer of Care Note  Patient: Adam Holloway  Procedure(s) Performed: A-FLUTTER ABLATION (N/A )  Patient Location: PACU  Anesthesia Type:General  Level of Consciousness: awake and alert   Airway & Oxygen Therapy: Patient Spontanous Breathing and Patient connected to nasal cannula oxygen  Post-op Assessment: Report given to RN and Post -op Vital signs reviewed and stable  Post vital signs: Reviewed and stable  Last Vitals:  Vitals Value Taken Time  BP 112/69 11/11/20 1323  Temp    Pulse 84 11/11/20 1324  Resp 12 11/11/20 1324  SpO2 98 % 11/11/20 1324  Vitals shown include unvalidated device data.  Last Pain:  Vitals:   11/11/20 0839  TempSrc: Oral         Complications: No complications documented.

## 2020-11-11 NOTE — Anesthesia Procedure Notes (Addendum)
Procedure Name: Intubation Date/Time: 11/11/2020 11:37 AM Performed by: Reece Agar, CRNA Pre-anesthesia Checklist: Patient identified, Emergency Drugs available, Suction available and Patient being monitored Patient Re-evaluated:Patient Re-evaluated prior to induction Oxygen Delivery Method: Circle System Utilized Preoxygenation: Pre-oxygenation with 100% oxygen Induction Type: IV induction Ventilation: Mask ventilation without difficulty Laryngoscope Size: Glidescope and 4 Grade View: Grade I Tube type: Oral Number of attempts: 1 Airway Equipment and Method: Stylet and Video-laryngoscopy Placement Confirmation: ETT inserted through vocal cords under direct vision,  positive ETCO2 and breath sounds checked- equal and bilateral Secured at: 24 cm Tube secured with: Tape Dental Injury: Teeth and Oropharynx as per pre-operative assessment

## 2020-11-11 NOTE — H&P (Signed)
Electrophysiology Office Note:    Date:  10/02/2020   ID:  Adam Holloway, Adam Holloway, Adam Holloway  PCP:  [name], MD  [facility] Cardiologist:  Kate Sable, MD  [facility] Electrophysiologist:  Vickie Epley, MD   Referring MD: Kate Sable, MD   Chief Complaint: Atrial flutter  History of Present Illness:    Adam Holloway is a 50 y.o. male who presents for an evaluation of atrial flutter at the request of Dr Garen Lah. Their medical history includes GERD and tobacco abuse.  He last saw Dr. Garen Lah on August 29, 2020.  At that appointment he complained of fluttering in his chest and shortness of breath.  At that appointment, he reported palpitations and fluttering in the chest that were occurring almost daily.  He had cut down his smoking to 2 cigarettes/day.  He was started on metoprolol succinate 25 mg daily and referred to EP given the very symptomatic nature of his palpitations and fluttering's.      Past Medical History:  Diagnosis Date  . Arthritis    back, left wrist, left knee  . Complication of anesthesia    bp dropped during gallbladder surgery in 2008  . Depression   . Family history of adverse reaction to anesthesia    mom stopped breathing during colonscopy  . GERD (gastroesophageal reflux disease)    h/o  . HA (headache)    h/o migrianes  . Herniated disc, cervical   . History of kidney stones    h/o stones  . History of leukocytosis   . Lumbago   . Pain in soft tissues of limb   . Pancreatitis   . Peripheral neuropathy 04/12/2019  . PTSD (post-traumatic stress disorder)   . Scoliosis          Past Surgical History:  Procedure Laterality Date  . CHOLECYSTECTOMY  2008  . COLONOSCOPY  2010  . COLONOSCOPY WITH PROPOFOL N/A 06/09/2016   Procedure: COLONOSCOPY WITH PROPOFOL;  Surgeon: Christene Lye, MD;  Location: [facility];  Service: Endoscopy;  Laterality: N/A;   . FISSURECTOMY N/A 06/29/2016   Procedure: FISSURECTOMY;  Surgeon: Christene Lye, MD;  Location: [facility];  Service: General;  Laterality: N/A;  . HEMORRHOID SURGERY N/A 06/29/2016   Procedure: HEMORRHOIDECTOMY;  Surgeon: Christene Lye, MD;  Location: [facility] OR[facility];  Service: General;  Laterality: N/A;  . PAROTIDECTOMY Left 1997  . SALIVARY GLAND SURGERY  1997  . SPHINCTEROTOMY N/A 06/29/2016   Procedure: SPHINCTEROTOMY;  Surgeon: Christene Lye, MD;  Location: [facility];  Service: General;  Laterality: N/A;  . WRIST ARTHROSCOPY Left 01/26/2017   Procedure: ARTHROSCOPY LEFT WRIST DEBRIDEMENT;  Surgeon: Daryll Brod, MD;  Location: [facility];  Service: Orthopedics;  Laterality: Left;  . WRIST SURGERY  2019    Current Medications: Active Medications      Current Meds  Medication Sig  . aspirin 81 MG EC tablet Take 81 mg by mouth daily. Swallow whole.  Marland Kitchen atorvastatin (LIPITOR) 20 MG tablet Take 1 tablet (20 mg total) by mouth daily.  . Vitamin D, Ergocalciferol, (DRISDOL) 1.25 MG (50000 UNIT) CAPS capsule Take 50,000 Units by mouth once a week.       Allergies:   Augmentin [amoxicillin-pot clavulanate], Hydrocodone-acetaminophen, Nsaids, Tolmetin, and Meloxicam   Social History        Socioeconomic History  . Marital status: Single    Spouse name: Not on file  . Number of children: 1  .  Years of education: GED  . Highest education level: Not on file  Occupational History    Comment: Disabled  Tobacco Use  . Smoking status: Current Every Day Smoker    Packs/day: 0.50    Years: 32.00    Pack years: 16.00    Types: Cigarettes    Last attempt to quit: 01/16/2019    Years since quitting: 1.7  . Smokeless tobacco: Former Systems developer  . Tobacco comment: Quit 2020 and restarted 10/2019 1-2 cig/day  Vaping Use  . Vaping Use: Never used  Substance and Sexual Activity  . Alcohol use: No    Alcohol/week: 0.0 standard drinks  .  Drug use: No  . Sexual activity: Not on file  Other Topics Concern  . Not on file  Social History Narrative   Patient lives at home with his mother.   Disabled.   Education GED.   Right handed.   Caffeine two cups of coffee daily.   Social Determinants of Health   Financial Resource Strain: Not on file  Food Insecurity: Not on file  Transportation Needs: Not on file  Physical Activity: Not on file  Stress: Not on file  Social Connections: Not on file     Family History: The patient's family history includes Cancer in his father; Colon cancer in his mother; Heart Problems in his father; Liver cancer in his brother; Lung cancer in his father.  ROS:   Please see the history of present illness.    All other systems reviewed and are negative.  EKGs/Labs/Other Studies Reviewed:    The following studies were reviewed today:  07/04/2020 Holter personally reviewed  12 min atrial flutter detected Rare PACs and PVCs There are other strips recorded with a ventricular rate of approximately 150 bpm that appear to be 2-1 atrial flutter.  These were labeled as sinus rhythm during the telemetry so I suspect the overall burden of atrial flutter is significantly higher than the monitor would suggest.     08/20/2020 Echo personally reviewed EF 45% RV normal No significant valvular abnormalities   EKG:  The ekg ordered today demonstrates normal sinus rhythm  Recent Labs: 07/23/2020: ALT 27; BUN 14; Creatinine, Ser 1.01; Potassium 4.2; Sodium 140 08/20/2020: Hemoglobin 14.6; Platelets 176  Recent Lipid Panel Labs (Brief)          Component Value Date/Time   CHOL 193 08/20/2020 1005   TRIG 96 08/20/2020 1005   HDL 44 08/20/2020 1005   CHOLHDL 4.4 08/20/2020 1005   VLDL 19 08/20/2020 1005   LDLCALC 130 (H) 08/20/2020 1005      Physical Exam:    VS:  There were no vitals taken for this visit.       Wt Readings from Last 3 Encounters:  09/17/20  226 lb (102.5 kg)  08/29/20 226 lb 2 oz (102.6 kg)  07/25/20 223 lb (101.2 kg)     GEN:  Well nourished, well developed in no acute distress HEENT: Normal NECK: No JVD; No carotid bruits LYMPHATICS: No lymphadenopathy CARDIAC: RRR, no murmurs, rubs, gallops RESPIRATORY:  Clear to auscultation without rales, wheezing or rhonchi  ABDOMEN: Soft, non-tender, non-distended MUSCULOSKELETAL:  No edema; No deformity  SKIN: Warm and dry NEUROLOGIC:  Alert and oriented x 3 PSYCHIATRIC:  Normal affect   ASSESSMENT:    1. Atrial flutter, unspecified type ([place])   2. Coronary artery calcification   3. Smoking    PLAN:    In order of problems listed above:  1. Atrial  flutter Patient has symptomatic paroxysmal atrial flutter.  No evidence of atrial fibrillation.  Currently he is not on any anticoagulant and just takes aspirin given the presence of coronary artery disease.  Given the highly symptomatic nature of his atrial flutter and the high frequency of his episodes, we discussed a rhythm control strategy.  We discussed using antiarrhythmics and/or ablation to manage his heart rhythm.  He would like to avoid long-term antiarrhythmic use and thus has decided to pursue ablation.  The risks, recovery and expected success rates of ablation were discussed in detail during today's visit.  We discussed the possibility of him developing atrial fibrillation in the future after eliminating the atrial flutter.  We will plan on doing the atrial flutter ablation and then implanting a loop recorder for ongoing atrial fibrillation surveillance 1 to 2 months after the ablation.  We will start Eliquis 5 mg twice daily today and continue this up until the day of ablation.  After the ablation, he will continue Eliquis for 1 to 2 months until I see him in follow-up.  At that appointment, we will implant a loop recorder for ongoing heart rhythm monitoring and discontinue Eliquis as long as he has not had recurrence  of arrhythmias in the postop phase.  Risk, benefits, and alternatives to EP study and radiofrequency ablation for atrial flutter were also discussed in detail today. These risks include but are not limited to stroke, bleeding, vascular damage, tamponade, perforation, damage to the esophagus, lungs, and other structures, pulmonary vein stenosis, worsening renal function, and death. The patient understands these risk and wishes to proceed.  We will therefore proceed with catheter ablation at the next available time.  Carto, ICE, anesthesia are requested for the procedure.     2. coronary artery disease Coronary artery calcium noted on CT coronary  3.  Tobacco abuse    --------------------------------------------------------------------------------  I have seen, examined the patient, and reviewed the above assessment and plan.    CTI ablation today. Eliquis planned until I see him again in the outpatient setting.  Vickie Epley, MD 11/11/2020 11:02 AM

## 2020-11-11 NOTE — Discharge Instructions (Signed)
Post procedure care instructions No driving for 4 days. No lifting over 5 lbs for 1 week. No vigorous or sexual activity for 1 week. You may return to work/your usual activities on 11/19/20. Keep procedure site clean & dry. If you notice increased pain, swelling, bleeding or pus, call/return!  You may shower after 24 hours, but no soaking in baths/hot tubs/pools for 1 week.   Cardiac Ablation, Care After  This sheet gives you information about how to care for yourself after your procedure. Your health care provider may also give you more specific instructions. If you have problems or questions, contact your health care provider. What can I expect after the procedure? After the procedure, it is common to have:  Bruising around your puncture site.  Tenderness around your puncture site.  Skipped heartbeats.  Tiredness (fatigue).  Follow these instructions at home: Puncture site care   Follow instructions from your health care provider about how to take care of your puncture site. Make sure you: ? If present, leave stitches (sutures), skin glue, or adhesive strips in place. These skin closures may need to stay in place for up to 2 weeks. If adhesive strip edges start to loosen and curl up, you may trim the loose edges. Do not remove adhesive strips completely unless your health care provider tells you to do that. ? If a large square bandage is present, this may be removed 24 hours after surgery.   Check your puncture site every day for signs of infection. Check for: ? Redness, swelling, or pain. ? Fluid or blood. If your puncture site starts to bleed, lie down on your back, apply firm pressure to the area, and contact your health care provider. ? Warmth. ? Pus or a bad smell. Driving  Do not drive for at least 4 days after your procedure or however long your health care provider recommends. (Do not resume driving if you have previously been instructed not to drive for other health  reasons.)  Do not drive or use heavy machinery while taking prescription pain medicine. Activity  Avoid activities that take a lot of effort for at least 7 days after your procedure.  Do not lift anything that is heavier than 5 lb (4.5 kg) for one week.   No sexual activity for 1 week.   Return to your normal activities as told by your health care provider. Ask your health care provider what activities are safe for you. General instructions  Take over-the-counter and prescription medicines only as told by your health care provider.  Do not use any products that contain nicotine or tobacco, such as cigarettes and e-cigarettes. If you need help quitting, ask your health care provider.  You may shower after 24 hours, but Do not take baths, swim, or use a hot tub for 1 week.   Do not drink alcohol for 24 hours after your procedure.  Keep all follow-up visits as told by your health care provider. This is important. Contact a health care provider if:  You have redness, mild swelling, or pain around your puncture site.  You have fluid or blood coming from your puncture site that stops after applying firm pressure to the area.  Your puncture site feels warm to the touch.  You have pus or a bad smell coming from your puncture site.  You have a fever.  You have chest pain or discomfort that spreads to your neck, jaw, or arm.  You are sweating a lot.  You feel  nauseous.  You have a fast or irregular heartbeat.  You have shortness of breath.  You are dizzy or light-headed and feel the need to lie down.  You have pain or numbness in the arm or leg closest to your puncture site. Get help right away if:  Your puncture site suddenly swells.  Your puncture site is bleeding and the bleeding does not stop after applying firm pressure to the area. These symptoms may represent a serious problem that is an emergency. Do not wait to see if the symptoms will go away. Get medical help  right away. Call your local emergency services (911 in the [place] Do not drive yourself to the hospital. Summary  After the procedure, it is normal to have bruising and tenderness at the puncture site in your groin, neck, or forearm.  Check your puncture site every day for signs of infection.  Get help right away if your puncture site is bleeding and the bleeding does not stop after applying firm pressure to the area. This is a medical emergency. This information is not intended to replace advice given to you by your health care provider. Make sure you discuss any questions you have with your health care provider.

## 2020-11-12 ENCOUNTER — Encounter (HOSPITAL_COMMUNITY): Payer: Self-pay | Admitting: Cardiology

## 2020-11-12 MED ORDER — PANTOPRAZOLE SODIUM 40 MG PO TBEC
40.0000 mg | DELAYED_RELEASE_TABLET | Freq: Every day | ORAL | 0 refills | Status: DC
Start: 2020-11-12 — End: 2020-12-19

## 2020-11-12 NOTE — Anesthesia Postprocedure Evaluation (Signed)
Anesthesia Post Note  Patient: Adam Holloway  Procedure(s) Performed: A-FLUTTER ABLATION (N/A )     Patient location during evaluation: PACU Anesthesia Type: General Level of consciousness: awake and alert Pain management: pain level controlled Vital Signs Assessment: post-procedure vital signs reviewed and stable Respiratory status: spontaneous breathing, nonlabored ventilation, respiratory function stable and patient connected to nasal cannula oxygen Cardiovascular status: blood pressure returned to baseline and stable Postop Assessment: no apparent nausea or vomiting Anesthetic complications: no   No complications documented.  Last Vitals:  Vitals:   11/11/20 1505 11/11/20 1632  BP: 122/75 119/67  Pulse: 78 79  Resp: 13 16  Temp:    SpO2: 98% 98%    Last Pain:  Vitals:   11/11/20 1541  TempSrc:   PainSc: 4                  Tiajuana Amass

## 2020-11-26 ENCOUNTER — Other Ambulatory Visit: Payer: Self-pay

## 2020-11-26 ENCOUNTER — Ambulatory Visit: Payer: Medicare Other | Attending: Orthopaedic Surgery

## 2020-11-26 DIAGNOSIS — M25522 Pain in left elbow: Secondary | ICD-10-CM | POA: Insufficient documentation

## 2020-11-26 NOTE — Patient Instructions (Addendum)
Access Code: [email or web address] URL: [email or web address] Date: 11/26/2020 Prepared by: Roxana Hires  Exercises Standing Wrist Flexion Stretch - 2-3 x daily - 7 x weekly - 3 reps - 45-60s hold Isometric Wrist Extension Pronated - 3 x daily - 7 x weekly - 1 sets - 10 reps - 10s hold Seated Isometric Wrist Radial Deviation with Manual Resistance - 3 x daily - 7 x weekly - 1 sets - 10 reps - 10s hold  Patient Education Tennis Elbow Golfer's Elbow Ice Massage

## 2020-11-26 NOTE — Therapy (Signed)
[facility] [address] [place], [place], [address] Phone: [phone]   Fax:  [phone]  Physical Therapy Evaluation  Patient Details  Name: NICKOLIS DIEL MRN: [id number] Date of Birth: December 27, 1970 Referring Provider (PT): Joni Fears   Encounter Date: 11/26/2020   PT End of Session - 11/26/20 0839    Visit Number 1    Number of Visits 17    Date for PT Re-Evaluation 01/21/21    Authorization Type eval: 11/26/20    PT Start Time 0845    PT Stop Time 0945    PT Time Calculation (min) 60 min    Activity Tolerance Patient tolerated treatment well    Behavior During Therapy [facility] for tasks assessed/performed           Past Medical History:  Diagnosis Date  . Arthritis    back, left wrist, left knee  . Complication of anesthesia    bp dropped during gallbladder surgery in 2008  . Depression   . Family history of adverse reaction to anesthesia    mom stopped breathing during colonscopy  . GERD (gastroesophageal reflux disease)    h/o  . HA (headache)    h/o migrianes  . Herniated disc, cervical   . History of kidney stones    h/o stones  . History of leukocytosis   . Lumbago   . Pain in soft tissues of limb   . Pancreatitis   . Peripheral neuropathy 04/12/2019  . PTSD (post-traumatic stress disorder)   . Scoliosis     Past Surgical History:  Procedure Laterality Date  . A-FLUTTER ABLATION N/A 11/11/2020   Procedure: A-FLUTTER ABLATION;  Surgeon: Vickie Epley, MD;  Location: [place] [facility];  Service: Cardiovascular;  Laterality: N/A;  . CHOLECYSTECTOMY  2008  . COLONOSCOPY  2010  . COLONOSCOPY WITH PROPOFOL N/A 06/09/2016   Procedure: COLONOSCOPY WITH PROPOFOL;  Surgeon: Christene Lye, MD;  Location: [facility];  Service: Endoscopy;  Laterality: N/A;  . FISSURECTOMY N/A 06/29/2016   Procedure: FISSURECTOMY;  Surgeon: Christene Lye, MD;  Location: [facility];  Service: General;   Laterality: N/A;  . HEMORRHOID SURGERY N/A 06/29/2016   Procedure: HEMORRHOIDECTOMY;  Surgeon: Christene Lye, MD;  Location: [facility];  Service: General;  Laterality: N/A;  . PAROTIDECTOMY Left 1997  . SALIVARY GLAND SURGERY  1997  . SPHINCTEROTOMY N/A 06/29/2016   Procedure: SPHINCTEROTOMY;  Surgeon: Christene Lye, MD;  Location: [facility];  Service: General;  Laterality: N/A;  . WRIST ARTHROSCOPY Left 01/26/2017   Procedure: ARTHROSCOPY LEFT WRIST DEBRIDEMENT;  Surgeon: Daryll Brod, MD;  Location: [facility];  Service: Orthopedics;  Laterality: Left;  . WRIST SURGERY  2019    There were no vitals filed for this visit.    Subjective Assessment - 11/26/20 0836    Subjective L elbow pain    Pertinent History Pt reports that 1 year ago February he started having really bad stiffness in his "whole left elbow" but more so on the lateral aspect. He had a steroid injection that gave him about 3 months of relief but eventually the pain returned. He had a repeat injection but unfortunately that one only gave him 2 weeks of relief. He has not undergone any additional steroid injections since that time. Pt reports that he feels like the pain has been worsening. He tosses and turns a lot during his sleep. He notes that the pain worsens when holding his  cell phone. He complains of occasional numbness in the palmar and dorsal aspects of digits 4-5. He saw orthopedics for his L elbow and they referred him for physical therapy. At the time of his initial consult they reported no evidence of ulnar nerve pathology. He had a heart ablation surgery on 11/11/20 which went well and pt remains on Eliquis through the end of the month. He used to play a lot of baseball and softball but not currently. He has a history of herniated cervical disc years ago which radiated down the LUE at the time and still gives him occasional pain. He feels like his neck and L shoulder pain may be related to his L  elbow issues. He had an MRI scan of his L elbow which showed mild tendinosis and small foci of partial thickness tearing of the common flexor and extensor tendons at their origins. He reports that his primary limitation related to his L elbow pain is lifting. Denies any issues with his RUE.    Limitations House hold activities    Diagnostic tests MRI (see history)    Patient Stated Goals Decrease pain so he can return to pain-free lifting of plates, groceries, etc.    Currently in Pain? Yes    Pain Score 0-No pain   Worst: 10/10, Best: 0/10   Pain Location Elbow    Pain Orientation Left    Pain Type Chronic pain    Pain Onset More than a month ago    Pain Frequency Intermittent    Aggravating Factors  holding cell phone, lifting something heavy (gallon of milk, plate of food),    Pain Relieving Factors resting wrist in neutral position, elbow strap helps, steroid injections, ice helps, hasn't tried heat, oxycodone doesn't help (pt takes prn  for back issue), Tylenol helps at night (doesn't take Ibuprofen due to history of chest pain reaction);    Effect of Pain on Daily Activities Limits ADLs/IADLs              [facility] PT Assessment - 11/26/20 0837      Assessment   Medical Diagnosis L elbow pain    Referring Provider (PT) Joni Fears    Onset Date/Surgical Date 11/27/19    Hand Dominance Right    Next MD Visit None scheduled    Prior Therapy None      Precautions   Precautions None      Restrictions   Weight Bearing Restrictions No      Balance Screen   Has the patient fallen in the past 6 months Yes    How many times? 1    Has the patient had a decrease in activity level because of a fear of falling?  No    Is the patient reluctant to leave their home because of a fear of falling?  No      Home Ecologist residence      Prior Function   Level of Independence Independent    Vocation On disability    Leisure Softball, baseball (not  recently), travel to the [place]   Overall Cognitive Status Within Functional Limits for tasks assessed               SUBJECTIVE   Chief complaint: L lateral elbow pain  Referring Dx: L elbow pain History: Pt reports that 1 year ago February he started having really bad stiffness in his "whole left  elbow" but more so on the lateral aspect. He had a steroid injection that gave him about 3 months of relief but eventually the pain returned. He had a repeat injection but unfortunately that one only gave him 2 weeks of relief. He has not undergone any additional steroid injections since that time. Pt reports that he feels like the pain has been worsening. He tosses and turns a lot during his sleep. He notes that the pain worsens when holding his cell phone. He complains of occasional numbness in the palmar and dorsal aspects of digits 4-5. He saw orthopedics for his L elbow and they referred him for physical therapy. At the time of his initial consult they reported no evidence of ulnar nerve pathology. He had a heart ablation surgery on 11/11/20 which went well and pt remains on Eliquis through the end of the month. He used to play a lot of baseball and softball but not currently. He has a history of herniated cervical disc years ago which radiated down the LUE at the time and still gives him occasional pain. He feels like his neck and L shoulder pain may be related to his L elbow issues. He had an MRI scan of his L elbow which showed mild tendinosis and small foci of partial thickness tearing of the common flexor and extensor tendons at their origins. He reports that his primary limitation related to his L elbow pain is lifting. Denies any issues with his RUE.    Recent elbow trauma: No Prior history of elbow/UE injury or pain: No, history of cervical disc herniation as well as L shoulder pain; Imaging: Yes, see history Pain quality: burning, sharp Pain: Present:  0/10, Best: 0/10, Worst: 10/10; Aggravating factors: holding cell phone, lifting something heavy (gallon of milk, plate of food),  Easing factors: resting wrist in neutral position, elbow strap helps, steroid injections, ice helps, hasn't tried heat, oxycodone doesn't help (pt takes prn  for back issue), Tylenol helps at night (doesn't take Ibuprofen due to history of chest pain reaction); 24 hour pain behavior: better in AM, worsens as day progresses, pain can wake pt up in the night if he rolls onto the left arm  Radiating pain: Yes, down forearm and into L hand Numbness/Tingling: Yes in digits 4 and 5 on both palmar and dorsal surfaces (started when elbow pain started) Follow-up appointment with MD: No Dominant hand: right    OBJECTIVE  Mental Status Patient is oriented to person, place and time.  Recent memory is intact.  Remote memory is intact.  Attention span and concentration are intact.  Expressive speech is intact.  Patient's fund of knowledge is within normal limits for educational level.    MUSCULOSKELETAL: Tremor: None Bulk: Normal Tone: Normal  Posture Forward head and rounded shoulders in standing and sitting   Cervical Screen AROM: WFL and painful in all directions but no reproduction of L lateral elbow pain; Spurlings A (ipsilateral lateral flexion/axial compression): R: Negative L: Negative Spurlings B (ipsilateral lateral flexion/contralateral rotation/axial compression): R: Negative L: Negative, however guarded and painful for Spurlings A and B in neck bilaterally; Repeated movement: Mild radiating pain into L upper arm and possibly into L elbow with repeated protraction, no centralization with repeated retraction; Hoffman Sign (cervical cord compression): R: Not examined L: Not examined ULTT Median: R: Not examined L: Not examined ULTT Ulnar: R: Not examined L: Not examined ULTT Radial: R: Not examined L: Not examined   Strength R/L 5/5 Shoulder flexion  (  anterior deltoid/pec major/coracobrachialis, axillary n. (C5/6) and musculocutaneous n. (C5-7)) 5/5 Shoulder abduction (deltoid/supraspinatus, axillary/suprascapular n, C5) 5/5 Shoulder external rotation (infraspinatus/teres minor) 5/5 Shoulder internal rotation (subcapularis/lats/pec major) 5/5* Elbow flexion (biceps brachii, brachialis, brachioradialis, musculoskeletal n, C5/6) 5/5* Elbow extension (triceps, radial n, C7) 5/5* Forearm Pronation 5/5* Forearm Supination 5/5* Wrist Extension (C6/7) 5/5* Wrist Flexion (C6/7) 5/5* Radial Deviation 5/5 Ulnar deviation Full strength bilateral finger adduction (interossei, ulnar n, T1) Full strength bilateral finger abduction Shoulder shrug full and painless bilaterally; Grip Strength: R: 92.7, 91.2 (92.0#), L: painfree (14.8#), maximum with pain: (39.3#)  AROM R/L WNL bilateral shoulder AROM 131/134 Elbow flexion 0/0 Elbow extension 73/35* Wrist flexion 34/32* Wrist extension 15/12* Radial deviation 38/33 Ulnar deviation 90/90 Forearm pronation 78/82*  Forearm supination *Indicates pain, overpressure performed unless otherwise indicated  Sensation Deferred  Palpation Patient is generally very tender to palpation along common extensor tendon and lateral mass of left dorsal forearm in multiple locations;  Passive Accessory Intervertebral Motion (PAIVM) Deferred  Reflex Testing Deferred          Objective measurements completed on examination: See above findings.               PT Education - 11/26/20 0839    Education Details Plan of care, golfers/tennis elbow, and HEP    Person(s) Educated Patient    Methods Explanation;Handout;Other (comment)   Medbridge video in HEP   Comprehension Verbalized understanding            PT Short Term Goals - 11/27/20 1248      PT SHORT TERM GOAL #1   Title Pt will be independent with HEP in order to improve strength and decrease elbow pain in order to improve  pain-free function at home.    Time 4    Period Weeks    Status New    Target Date 12/24/20      PT SHORT TERM GOAL #2   Title Pt will decrease quick DASH score by at least 8% in order to demonstrate clinically significant reduction in disability.    Baseline 11/26/20: 95.5%    Time 4    Period Weeks    Status New    Target Date 12/24/20             PT Long Term Goals - 11/27/20 1249      PT LONG TERM GOAL #1   Title Pt will decrease worst elbow pain as reported on NPRS by at least 3 points in order to demonstrate clinically significant reduction in elbow pain.    Baseline 11/27/20: worst: 10/10    Time 8    Period Weeks    Status New    Target Date 01/21/21      PT LONG TERM GOAL #2   Title Pt will decrease quick DASH score to below 25% in order to demonstrate clinically significant reduction in disability and ability to complete all ADLs without increase in his elbow pain.    Baseline 11/26/20: 95.5%    Time 8    Period Weeks    Status New    Target Date 01/21/21      PT LONG TERM GOAL #3   Title Pt will improve FOTO to at least 52 in order to demonstrate significant improvement in function at home.    Baseline 11/26/20: 31    Time 8    Status New    Target Date 01/21/21      PT LONG TERM GOAL #4  Title Pt will improve L wrist flexion to within 10 degrees of R wrist in order to demonstrate improved extensibility of L wrist extensors to reduce risk of recurrent irritation of lateral epicondylitis    Time 8    Period Weeks    Status New    Target Date 01/21/21                  Plan - 11/26/20 0840    Clinical Impression Statement Pt is a pleasant 50 year-old male referred for L elbow pain. PT examination reveals significant tenderness to palpation along L common extensor tendon and lateral mass. Painful active L wrist flexion and extension and painful resisted L wrist radial deviation. Limitation in L wrist flexion range of motion. Pain-free gripping of LUE  is 14.8# and maximum grip strength is 39.3#. RUE grip strength is pain-free and 92.0#. His QuickDASH is 95.5% indicating significant disability related to his LUE pain. Pt provided L wrist extensor stretch today as well as pain-free isometric strengthening and pain-control modalities for his home program. He will benefit from skilled PT services to address deficits and return to pain-free function at home.    Personal Factors and Comorbidities Age;Comorbidity 3+;Past/Current Experience;Time since onset of injury/illness/exacerbation    Comorbidities OA, depression, herniated cervical disck, PTSD    Examination-Activity Limitations Lift    Examination-Participation Restrictions Cleaning;Community Activity;Laundry;Meal Prep;Shop    Stability/Clinical Decision Making Unstable/Unpredictable    Clinical Decision Making High    Rehab Potential Good    PT Frequency 2x / week    PT Duration 8 weeks    PT Treatment/Interventions ADLs/Self Care Home Management;Aquatic Therapy;Biofeedback;Canalith Repostioning;Cryotherapy;Electrical Stimulation;Iontophoresis 4mg /ml Dexamethasone;Moist Heat;Traction;Ultrasound;Gait training;Stair training;Functional mobility training;Therapeutic activities;Therapeutic exercise;Balance training;Neuromuscular re-education;Cognitive remediation;Patient/family education;Manual techniques;Passive range of motion;Dry needling;Splinting;Taping;Vestibular;Spinal Manipulations;Joint Manipulations    PT Next Visit Plan Review HEP, cervical PAM testing, STM, manual therapy for elbow (MWM), progress isometrics and eventually eccentric strengthening    PT Home Exercise Plan Medbridge Access Code: [email or web address]    Consulted and Agree with Plan of Care Patient           Patient will benefit from skilled therapeutic intervention in order to improve the following deficits and impairments:  Pain,Impaired flexibility,Decreased strength  Visit Diagnosis: Pain in left elbow     Problem  List Patient Active Problem List   Diagnosis Date Noted  . Atypical atrial flutter ([place]) 10/29/2020  . Left tennis elbow 07/10/2020  . Weight loss 06/10/2020  . Vitamin D deficiency 04/11/2020  . Gastroesophageal reflux disease without esophagitis 04/11/2020  . Anxiety 04/11/2020  . Pain in left wrist 12/13/2019  . Pain in left elbow 12/13/2019  . Neuropathy 04/12/2019  . Low vitamin B12 level 02/20/2019  . Leukocytosis 02/12/2019  . Right-sided chest pain 02/12/2019  . Neck pain 12/07/2018  . HA (headache)   . Pain in soft tissues of limb   . Lumbago   . Gynecomastia, male 07/12/2013   Phillips Grout PT, DPT, GCS  Kamilah Correia 11/27/2020, 4:34 PM  [facility] [address] [place], [place], [address] Phone: [phone]   Fax:  [phone]  Name: HERSHAL ERIKSSON MRN: [id number] Date of Birth: 1971-07-01

## 2020-11-27 ENCOUNTER — Other Ambulatory Visit: Payer: Self-pay | Admitting: Internal Medicine

## 2020-11-28 ENCOUNTER — Encounter: Payer: Self-pay | Admitting: Cardiology

## 2020-11-28 ENCOUNTER — Ambulatory Visit (INDEPENDENT_AMBULATORY_CARE_PROVIDER_SITE_OTHER): Payer: Medicare Other | Admitting: Cardiology

## 2020-11-28 ENCOUNTER — Other Ambulatory Visit: Payer: Self-pay

## 2020-11-28 VITALS — BP 116/68 | HR 86 | Ht 73.0 in | Wt 230.0 lb

## 2020-11-28 DIAGNOSIS — I2510 Atherosclerotic heart disease of native coronary artery without angina pectoris: Secondary | ICD-10-CM

## 2020-11-28 DIAGNOSIS — I4892 Unspecified atrial flutter: Secondary | ICD-10-CM

## 2020-11-28 NOTE — Patient Instructions (Addendum)
Medication Instructions:   Your physician has recommended you make the following change in your medication:   STOP taking your Aspirin.   *If you need a refill on your cardiac medications before your next appointment, please call your pharmacy*   Lab Work: None ordered If you have labs (blood work) drawn today and your tests are completely normal, you will receive your results only by: Marland Kitchen MyChart Message (if you have MyChart) OR . A paper copy in the mail If you have any lab test that is abnormal or we need to change your treatment, we will call you to review the results.   Testing/Procedures: None ordered   Follow-Up: At [facility], you and your health needs are our priority.  As part of our continuing mission to provide you with exceptional heart care, we have created designated Provider Care Teams.  These Care Teams include your primary Cardiologist (physician) and Advanced Practice Providers (APPs -  Physician Assistants and Nurse Practitioners) who all work together to provide you with the care you need, when you need it.  We recommend signing up for the patient portal called "MyChart".  Sign up information is provided on this After Visit Summary.  MyChart is used to connect with patients for Virtual Visits (Telemedicine).  Patients are able to view lab/test results, encounter notes, upcoming appointments, etc.  Non-urgent messages can be sent to your provider as well.   To learn more about what you can do with MyChart, go to [email or web address].    Your next appointment:   6 month(s)  The format for your next appointment:   In Person  Provider:   Kate Sable, MD   Other Instructions

## 2020-11-28 NOTE — Progress Notes (Signed)
Cardiology Office Note:    Date:  11/28/2020   ID:  Adam Holloway, DOB 1971-09-05, MRN [id number]  PCP:  [name], MD  [facility] Cardiologist:  Kate Sable, MD  [facility] Electrophysiologist:  Vickie Epley, MD   Referring MD: [name], MD   Chief Complaint  Patient presents with  . Follow-up    3 months--s/p Aflutter ablation 11/11/20  Pt states he has been doing well until a couple of days ago when he ate pizza    History of Present Illness:    Adam Holloway is a 50 y.o. male with a hx of atrial flutter s/p RFA 10/2020, CAD (45-50% mLAD), migraines, PTSD, GERD, former smoker x30+ years who presents for follow-up.    Previously seen for atrial flutter which was symptomatic.  Refer to EP and subsequently underwent RFA 11/11/2020.  Since his ablation procedure, he has been doing relatively well without palpitations 1 to 2 days ago after he ate some pizza.  States noticing fast heart rates with occasional skipped beats.  Denies dizziness, presyncope or syncope.  Plans to follow-up with electrophysiology at the end of the month where plans are being made to place ILR.  He quit smoking prior to his ablation procedure.   Prior notes Cardiac monitor was placed last month on 06/2020 showing about 12 minutes of atrial flutter, less than 1% burden. CCTA 07/2020 with calcium score of 123, calcified plaque in the mid LAD causing mild to moderate stenosis 45 to 50%.  CT FFR with no significant stenosis.   Past Medical History:  Diagnosis Date  . Arthritis    back, left wrist, left knee  . Complication of anesthesia    bp dropped during gallbladder surgery in 2008  . Depression   . Family history of adverse reaction to anesthesia    mom stopped breathing during colonscopy  . GERD (gastroesophageal reflux disease)    h/o  . HA (headache)    h/o migrianes  . Herniated disc, cervical   . History of kidney stones    h/o stones  . History of leukocytosis    . Lumbago   . Pain in soft tissues of limb   . Pancreatitis   . Peripheral neuropathy 04/12/2019  . PTSD (post-traumatic stress disorder)   . Scoliosis     Past Surgical History:  Procedure Laterality Date  . A-FLUTTER ABLATION N/A 11/11/2020   Procedure: A-FLUTTER ABLATION;  Surgeon: Vickie Epley, MD;  Location: [place] [facility];  Service: Cardiovascular;  Laterality: N/A;  . CHOLECYSTECTOMY  2008  . COLONOSCOPY  2010  . COLONOSCOPY WITH PROPOFOL N/A 06/09/2016   Procedure: COLONOSCOPY WITH PROPOFOL;  Surgeon: Christene Lye, MD;  Location: [facility];  Service: Endoscopy;  Laterality: N/A;  . FISSURECTOMY N/A 06/29/2016   Procedure: FISSURECTOMY;  Surgeon: Christene Lye, MD;  Location: [facility];  Service: General;  Laterality: N/A;  . HEMORRHOID SURGERY N/A 06/29/2016   Procedure: HEMORRHOIDECTOMY;  Surgeon: Christene Lye, MD;  Location: [facility];  Service: General;  Laterality: N/A;  . PAROTIDECTOMY Left 1997  . SALIVARY GLAND SURGERY  1997  . SPHINCTEROTOMY N/A 06/29/2016   Procedure: SPHINCTEROTOMY;  Surgeon: Christene Lye, MD;  Location: [facility];  Service: General;  Laterality: N/A;  . WRIST ARTHROSCOPY Left 01/26/2017   Procedure: ARTHROSCOPY LEFT WRIST DEBRIDEMENT;  Surgeon: Daryll Brod, MD;  Location: [place];  Service: Orthopedics;  Laterality: Left;  . WRIST SURGERY  2019  Current Medications: Current Meds  Medication Sig  . acetaminophen (TYLENOL) 325 MG tablet Take 325 mg by mouth every 6 (six) hours as needed for moderate pain.  Marland Kitchen apixaban (ELIQUIS) 5 MG TABS tablet Take 1 tablet (5 mg total) by mouth 2 (two) times daily.  . calcium carbonate (TUMS - DOSED IN MG ELEMENTAL CALCIUM) 500 MG chewable tablet Chew 500 mg by mouth daily as needed for indigestion or heartburn.  . diazepam (VALIUM) 5 MG tablet TAKE 1 TAB BY MOUTH DAILY  . Oxycodone HCl 10 MG TABS Take 5 mg by mouth 4 (four) times daily as needed  for pain.  . pantoprazole (PROTONIX) 40 MG tablet Take 1 tablet (40 mg total) by mouth daily.  . Vitamin D, Ergocalciferol, (DRISDOL) 1.25 MG (50000 UNIT) CAPS capsule Take 50,000 Units by mouth every Wednesday.  . [DISCONTINUED] aspirin 81 MG EC tablet Take 81 mg by mouth daily. Swallow whole.     Allergies:   Augmentin [amoxicillin-pot clavulanate], Atorvastatin, Nsaids, Tolmetin, and Meloxicam   Social History   Socioeconomic History  . Marital status: Single    Spouse name: Not on file  . Number of children: 1  . Years of education: GED  . Highest education level: Not on file  Occupational History    Comment: Disabled  Tobacco Use  . Smoking status: Former Smoker    Packs/day: 0.50    Years: 32.00    Pack years: 16.00    Types: Cigarettes    Quit date: 11/08/2020    Years since quitting: 0.0  . Smokeless tobacco: Former Systems developer  . Tobacco comment: Quit 2020 and restarted 10/2019 1-2 cig/day  States he quit 2 days before procedure 2022  Vaping Use  . Vaping Use: Never used  Substance and Sexual Activity  . Alcohol use: No    Alcohol/week: 0.0 standard drinks  . Drug use: No  . Sexual activity: Not on file  Other Topics Concern  . Not on file  Social History Narrative   Patient lives at home with his mother.   Disabled.   Education GED.   Right handed.   Caffeine two cups of coffee daily.   Social Determinants of Health   Financial Resource Strain: Not on file  Food Insecurity: Not on file  Transportation Needs: Not on file  Physical Activity: Not on file  Stress: Not on file  Social Connections: Not on file     Family History: The patient's family history includes Cancer in his father; Colon cancer in his mother; Heart Problems in his father; Liver cancer in his brother; Lung cancer in his father.  ROS:   Please see the history of present illness.     All other systems reviewed and are negative.  EKGs/Labs/Other Studies Reviewed:    The following studies  were reviewed today:   EKG:  EKG is  ordered today.  The ekg ordered today demonstrates sinus rhythm, PACs  Recent Labs: 11/08/2020: ALT 26; BUN 15; Creatinine, Ser 1.00; Hemoglobin 15.5; Platelets 195; Potassium 4.6; Sodium 137  Recent Lipid Panel    Component Value Date/Time   CHOL 193 08/20/2020 1005   TRIG 96 08/20/2020 1005   HDL 44 08/20/2020 1005   CHOLHDL 4.4 08/20/2020 1005   VLDL 19 08/20/2020 1005   LDLCALC 130 (H) 08/20/2020 1005     Risk Assessment/Calculations:      Physical Exam:    VS:  BP 116/68   Pulse 86   Ht 6\' 1"  (1.854  m)   Wt 230 lb (104.3 kg)   BMI 30.34 kg/m     Wt Readings from Last 3 Encounters:  11/28/20 230 lb (104.3 kg)  11/11/20 226 lb (102.5 kg)  11/05/20 226 lb (102.5 kg)     GEN:  Well nourished, well developed in no acute distress HEENT: Normal NECK: No JVD; No carotid bruits LYMPHATICS: No lymphadenopathy CARDIAC: RRR, no murmurs, rubs, gallops RESPIRATORY:  Clear to auscultation without rales, wheezing or rhonchi  ABDOMEN: Soft, non-tender, non-distended MUSCULOSKELETAL:  No edema; No deformity  SKIN: Warm and dry NEUROLOGIC:  Alert and oriented x 3 PSYCHIATRIC:  Normal affect   ASSESSMENT:    1. Atrial flutter, unspecified type ([place])   2. Coronary artery disease involving native coronary artery of native heart, unspecified whether angina present    PLAN:    In order of problems listed above:  1. atrial flutter s/p RFA 10/2020, patient advised occasional arrhythmias is possible after recent ablation.  Keep appointment with EP for ILR placement.  Continue Eliquis as prescribed.  Okay to stop aspirin since patient is on Eliquis.  Overall not very symptomatic. 2. Mild to moderate LAD stenosis 45 to 50%.  CT FFR with no significant stenosis.  Eliquis, Lipitor.  Patient was congratulated on stopping smoking.  Follow-up in 6 months  Shared Decision Making/Informed Consent      Medication Adjustments/Labs and Tests  Ordered: Current medicines are reviewed at length with the patient today.  Concerns regarding medicines are outlined above.  No orders of the defined types were placed in this encounter.  No orders of the defined types were placed in this encounter.   Patient Instructions  Medication Instructions:   Your physician has recommended you make the following change in your medication:   STOP taking your Aspirin.   *If you need a refill on your cardiac medications before your next appointment, please call your pharmacy*   Lab Work: None ordered If you have labs (blood work) drawn today and your tests are completely normal, you will receive your results only by: Marland Kitchen MyChart Message (if you have MyChart) OR . A paper copy in the mail If you have any lab test that is abnormal or we need to change your treatment, we will call you to review the results.   Testing/Procedures: None ordered   Follow-Up: At [facility], you and your health needs are our priority.  As part of our continuing mission to provide you with exceptional heart care, we have created designated Provider Care Teams.  These Care Teams include your primary Cardiologist (physician) and Advanced Practice Providers (APPs -  Physician Assistants and Nurse Practitioners) who all work together to provide you with the care you need, when you need it.  We recommend signing up for the patient portal called "MyChart".  Sign up information is provided on this After Visit Summary.  MyChart is used to connect with patients for Virtual Visits (Telemedicine).  Patients are able to view lab/test results, encounter notes, upcoming appointments, etc.  Non-urgent messages can be sent to your provider as well.   To learn more about what you can do with MyChart, go to [email or web address].    Your next appointment:   6 month(s)  The format for your next appointment:   In Person  Provider:   Kate Sable, MD   Other  Instructions      Signed, Kate Sable, MD  11/28/2020 12:22 PM    [place]

## 2020-11-29 NOTE — Addendum Note (Signed)
Addended by: Ernie Hew D on: 11/29/2020 08:30 AM   Modules accepted: Orders

## 2020-12-03 ENCOUNTER — Ambulatory Visit: Payer: Medicare Other

## 2020-12-03 DIAGNOSIS — M25522 Pain in left elbow: Secondary | ICD-10-CM

## 2020-12-04 NOTE — Patient Instructions (Addendum)
Access Code: [email or web address] URL: [email or web address] Date: 12/05/2020 Prepared by: Roxana Hires  Exercises Standing Wrist Flexion Stretch - 2-3 x daily - 7 x weekly - 3 reps - 45-60s hold Isometric Wrist Extension Pronated - 3 x daily - 7 x weekly - 1 sets - 10 reps - 10s hold Seated Isometric Wrist Radial Deviation with Manual Resistance - 3 x daily - 7 x weekly - 1 sets - 10 reps - 10s hold Seated Eccentric Wrist Extension - 3 x daily - 7 x weekly - 2 sets - 10 reps - 10s hold  Patient Education Tennis Elbow Golfer's Elbow Ice Massage

## 2020-12-05 ENCOUNTER — Other Ambulatory Visit: Payer: Self-pay

## 2020-12-05 ENCOUNTER — Ambulatory Visit: Payer: Medicare Other

## 2020-12-05 DIAGNOSIS — M25522 Pain in left elbow: Secondary | ICD-10-CM

## 2020-12-05 NOTE — Therapy (Signed)
[place] [facility] [address] [place], [place], [address] Phone: [phone]   Fax:  [phone]  Physical Therapy Treatment  Patient Details  Name: Adam Holloway MRN: [id number] Date of Birth: 1971-07-25 Referring Provider (PT): Joni Fears   Encounter Date: 12/05/2020   PT End of Session - 12/05/20 1137    Visit Number 2    Number of Visits 17    Date for PT Re-Evaluation 01/21/21    Authorization Type eval: 11/26/20    PT Start Time 1030    PT Stop Time 1100    PT Time Calculation (min) 30 min    Activity Tolerance Patient tolerated treatment well    Behavior During Therapy [facility] for tasks assessed/performed           Past Medical History:  Diagnosis Date  . Arthritis    back, left wrist, left knee  . Complication of anesthesia    bp dropped during gallbladder surgery in 2008  . Depression   . Family history of adverse reaction to anesthesia    mom stopped breathing during colonscopy  . GERD (gastroesophageal reflux disease)    h/o  . HA (headache)    h/o migrianes  . Herniated disc, cervical   . History of kidney stones    h/o stones  . History of leukocytosis   . Lumbago   . Pain in soft tissues of limb   . Pancreatitis   . Peripheral neuropathy 04/12/2019  . PTSD (post-traumatic stress disorder)   . Scoliosis     Past Surgical History:  Procedure Laterality Date  . A-FLUTTER ABLATION N/A 11/11/2020   Procedure: A-FLUTTER ABLATION;  Surgeon: Vickie Epley, MD;  Location: [place] [facility];  Service: Cardiovascular;  Laterality: N/A;  . CHOLECYSTECTOMY  2008  . COLONOSCOPY  2010  . COLONOSCOPY WITH PROPOFOL N/A 06/09/2016   Procedure: COLONOSCOPY WITH PROPOFOL;  Surgeon: Christene Lye, MD;  Location: [facility];  Service: Endoscopy;  Laterality: N/A;  . FISSURECTOMY N/A 06/29/2016   Procedure: FISSURECTOMY;  Surgeon: Christene Lye, MD;  Location: [facility];  Service: General;   Laterality: N/A;  . HEMORRHOID SURGERY N/A 06/29/2016   Procedure: HEMORRHOIDECTOMY;  Surgeon: Christene Lye, MD;  Location: [facility];  Service: General;  Laterality: N/A;  . PAROTIDECTOMY Left 1997  . SALIVARY GLAND SURGERY  1997  . SPHINCTEROTOMY N/A 06/29/2016   Procedure: SPHINCTEROTOMY;  Surgeon: Christene Lye, MD;  Location: [facility];  Service: General;  Laterality: N/A;  . WRIST ARTHROSCOPY Left 01/26/2017   Procedure: ARTHROSCOPY LEFT WRIST DEBRIDEMENT;  Surgeon: Daryll Brod, MD;  Location: [place];  Service: Orthopedics;  Laterality: Left;  . WRIST SURGERY  2019    There were no vitals filed for this visit.   Subjective Assessment - 12/05/20 1025    Subjective Pt denies any elbow pain upon arrival today. He denies any changes since his last therapy session. He is performing his HEP and denies any increase in pain. No specific questions or concerns at this time.    Pertinent History Pt reports that 1 year ago February he started having really bad stiffness in his "whole left elbow" but more so on the lateral aspect. He had a steroid injection that gave him about 3 months of relief but eventually the pain returned. He had a repeat injection but unfortunately that one only gave him 2 weeks of relief. He has not undergone any additional steroid injections  since that time. Pt reports that he feels like the pain has been worsening. He tosses and turns a lot during his sleep. He notes that the pain worsens when holding his cell phone. He complains of occasional numbness in the palmar and dorsal aspects of digits 4-5. He saw orthopedics for his L elbow and they referred him for physical therapy. At the time of his initial consult they reported no evidence of ulnar nerve pathology. He had a heart ablation surgery on 11/11/20 which went well and pt remains on Eliquis through the end of the month. He used to play a lot of baseball and softball but not currently. He has a  history of herniated cervical disc years ago which radiated down the LUE at the time and still gives him occasional pain. He feels like his neck and L shoulder pain may be related to his L elbow issues. He had an MRI scan of his L elbow which showed mild tendinosis and small foci of partial thickness tearing of the common flexor and extensor tendons at their origins. He reports that his primary limitation related to his L elbow pain is lifting. Denies any issues with his RUE.    Limitations House hold activities    Diagnostic tests MRI (see history)    Patient Stated Goals Decrease pain so he can return to pain-free lifting of plates, groceries, etc.    Currently in Pain? No/denies    Pain Onset --             TREATMENT   Ther-ex  L wrist extensor stretch with straight elbow to improve extensibility of wrist extensors 45s hold x 3; L wrist isometric extension 10s hold x 10; L wrist isometric radial deviation 10s hold x 10; L wirst eccentric extension off edge of table 10s lowering x 10 (added to HEP);   Manual Therapy  IASTM to L wrist extensors (lateral mass) to improve tissue extensibility and improve blood flow. Worked distal to proximal with fanning and sweeping strokes x 5 minutes; L radial head mobilizations A/P and PA grade III, 30s/bout x 2 bouts each; L forearm mobilization with movement with medial to lateral glide ulna/radius on humerus using belt and pt performing pain-free gripping during glide 2 x 10;   Trigger Point Dry Needling (TDN), unbilled Education performed with patient regarding potential benefit of TDN. Reviewed precautions and risks with patient. Pt provided verbal consent to treatment. Using clean technique with pt in sitting TDN performed to L ECRB with 2, 0.25 x 40 single needle placements with local twitch response (LTR) during both placements. Pistoning technique utilized.     Pt educated throughout session about proper posture and technique with  exercises. Improved exercise technique, movement at target joints, use of target muscles after min to mod verbal, visual, tactile cues.    Patient with notably less pain today compared to initial evaluation.  He has been consistent with his HEP which has helped with his pain.  Initiated trigger point dry needling with patient today and performed 2 placements in ECRB.  Also initiated manual techniques including mobilization with movement during pain-free gripping.  HEP progressed to left wrist extensor eccentric strengthening.  Patient encouraged to continue stretches and additional HEP and follow-up as scheduled. Pt will benefit from PT services to address deficits in L elbow pain in order to return to full function at home.  PT Short Term Goals - 11/27/20 1248      PT SHORT TERM GOAL #1   Title Pt will be independent with HEP in order to improve strength and decrease elbow pain in order to improve pain-free function at home.    Time 4    Period Weeks    Status New    Target Date 12/24/20      PT SHORT TERM GOAL #2   Title Pt will decrease quick DASH score by at least 8% in order to demonstrate clinically significant reduction in disability.    Baseline 11/26/20: 95.5%    Time 4    Period Weeks    Status New    Target Date 12/24/20             PT Long Term Goals - 11/27/20 1249      PT LONG TERM GOAL #1   Title Pt will decrease worst elbow pain as reported on NPRS by at least 3 points in order to demonstrate clinically significant reduction in elbow pain.    Baseline 11/27/20: worst: 10/10    Time 8    Period Weeks    Status New    Target Date 01/21/21      PT LONG TERM GOAL #2   Title Pt will decrease quick DASH score to below 25% in order to demonstrate clinically significant reduction in disability and ability to complete all ADLs without increase in his elbow pain.    Baseline 11/26/20: 95.5%    Time 8    Period Weeks    Status New     Target Date 01/21/21      PT LONG TERM GOAL #3   Title Pt will improve FOTO to at least 52 in order to demonstrate significant improvement in function at home.    Baseline 11/26/20: 31    Time 8    Status New    Target Date 01/21/21      PT LONG TERM GOAL #4   Title Pt will improve L wrist flexion to within 10 degrees of R wrist in order to demonstrate improved extensibility of L wrist extensors to reduce risk of recurrent irritation of lateral epicondylitis    Baseline 11/26/20: R/L 73/35    Time 8    Period Weeks    Status New    Target Date 01/21/21      PT LONG TERM GOAL #5   Title Pt will improve pain-free gripping in LUE to within 15% (78.2#)  of RUE in order to be able to lift plates/groceries and hold cell phone without pain    Baseline 11/26/20: pain-free 14.8#    Time 8    Status New    Target Date 01/22/21                 Plan - 12/05/20 1157    Clinical Impression Statement Patient with notably less pain today compared to initial evaluation.  He has been consistent with his HEP which has helped with his pain.  Initiated trigger point dry needling with patient today and performed 2 placements in ECRB.  Also initiated manual techniques including mobilization with movement during pain-free gripping.  HEP progressed to left wrist extensor eccentric strengthening.  Patient encouraged to continue stretches and additional HEP and follow-up as scheduled. Pt will benefit from PT services to address deficits in L elbow pain in order to return to full function at home.    Personal Factors and Comorbidities Age;Comorbidity 3+;Past/Current Experience;Time  since onset of injury/illness/exacerbation    Comorbidities OA, depression, herniated cervical disck, PTSD    Examination-Activity Limitations Lift    Examination-Participation Restrictions Cleaning;Community Activity;Laundry;Meal Prep;Shop    Stability/Clinical Decision Making Unstable/Unpredictable    Rehab Potential Good     PT Frequency 2x / week    PT Duration 8 weeks    PT Treatment/Interventions ADLs/Self Care Home Management;Aquatic Therapy;Biofeedback;Canalith Repostioning;Cryotherapy;Electrical Stimulation;Iontophoresis 4mg /ml Dexamethasone;Moist Heat;Traction;Ultrasound;Gait training;Stair training;Functional mobility training;Therapeutic activities;Therapeutic exercise;Balance training;Neuromuscular re-education;Cognitive remediation;Patient/family education;Manual techniques;Passive range of motion;Dry needling;Splinting;Taping;Vestibular;Spinal Manipulations;Joint Manipulations    PT Next Visit Plan Review HEP, STM, manual therapy for elbow (MWM), progress isometrics and eventually eccentric strengthening    PT Home Exercise Plan Medbridge Access Code: [email or web address]    Consulted and Agree with Plan of Care Patient           Patient will benefit from skilled therapeutic intervention in order to improve the following deficits and impairments:  Pain,Impaired flexibility,Decreased strength  Visit Diagnosis: Pain in left elbow     Problem List Patient Active Problem List   Diagnosis Date Noted  . Atypical atrial flutter ([place]) 10/29/2020  . Left tennis elbow 07/10/2020  . Weight loss 06/10/2020  . Vitamin D deficiency 04/11/2020  . Gastroesophageal reflux disease without esophagitis 04/11/2020  . Anxiety 04/11/2020  . Pain in left wrist 12/13/2019  . Pain in left elbow 12/13/2019  . Neuropathy 04/12/2019  . Low vitamin B12 level 02/20/2019  . Leukocytosis 02/12/2019  . Right-sided chest pain 02/12/2019  . Neck pain 12/07/2018  . HA (headache)   . Pain in soft tissues of limb   . Lumbago   . Gynecomastia, male 07/12/2013   Phillips Grout PT, DPT, GCS  Phylliss Strege 12/05/2020, 12:02 PM  [place] [facility] [address] [place], [place], [address] Phone: [phone]   Fax:  [phone]  Name: Adam Holloway MRN: [id number] Date of Birth:  [birth year]

## 2020-12-09 NOTE — Patient Instructions (Addendum)
Access Code: [email or web address] URL: [email or web address] Date: 12/10/2020 Prepared by: Roxana Hires  Exercises Standing Wrist Flexion Stretch - 2-3 x daily - 7 x weekly - 3 reps - 45-60s hold Seated Eccentric Wrist Extension - 3 x daily - 7 x weekly - 2 sets - 10 reps - 10s hold Seated Wrist Radial Deviation with Anchored Resistance - 3 x daily - 7 x weekly - 2 sets - 10 reps - 10s hold  Patient Education Tennis Elbow Golfer's Elbow Ice Massage

## 2020-12-10 ENCOUNTER — Other Ambulatory Visit: Payer: Self-pay

## 2020-12-10 ENCOUNTER — Ambulatory Visit: Payer: Medicare Other

## 2020-12-10 DIAGNOSIS — M25522 Pain in left elbow: Secondary | ICD-10-CM | POA: Diagnosis not present

## 2020-12-10 NOTE — Therapy (Signed)
[facility] [address]. [place], [place], [address] Phone: [phone]   Fax:  [phone]  Physical Therapy Treatment  Patient Details  Name: Adam Holloway MRN: [id number] Date of Birth: 12/09/1970 Referring Provider (PT): Joni Fears   Encounter Date: 12/10/2020   PT End of Session - 12/10/20 0913    Visit Number 3    Number of Visits 17    Date for PT Re-Evaluation 01/21/21    Authorization Type eval: 11/26/20    PT Start Time 0848    PT Stop Time 0930    PT Time Calculation (min) 42 min    Activity Tolerance Patient tolerated treatment well    Behavior During Therapy [facility] for tasks assessed/performed           Past Medical History:  Diagnosis Date  . Arthritis    back, left wrist, left knee  . Complication of anesthesia    bp dropped during gallbladder surgery in 2008  . Depression   . Family history of adverse reaction to anesthesia    mom stopped breathing during colonscopy  . GERD (gastroesophageal reflux disease)    h/o  . HA (headache)    h/o migrianes  . Herniated disc, cervical   . History of kidney stones    h/o stones  . History of leukocytosis   . Lumbago   . Pain in soft tissues of limb   . Pancreatitis   . Peripheral neuropathy 04/12/2019  . PTSD (post-traumatic stress disorder)   . Scoliosis     Past Surgical History:  Procedure Laterality Date  . A-FLUTTER ABLATION N/A 11/11/2020   Procedure: A-FLUTTER ABLATION;  Surgeon: Vickie Epley, MD;  Location: [facility];  Service: Cardiovascular;  Laterality: N/A;  . CHOLECYSTECTOMY  2008  . COLONOSCOPY  2010  . COLONOSCOPY WITH PROPOFOL N/A 06/09/2016   Procedure: COLONOSCOPY WITH PROPOFOL;  Surgeon: Christene Lye, MD;  Location: [facility];  Service: Endoscopy;  Laterality: N/A;  . FISSURECTOMY N/A 06/29/2016   Procedure: FISSURECTOMY;  Surgeon: Christene Lye, MD;  Location: [facility];  Service: General;   Laterality: N/A;  . HEMORRHOID SURGERY N/A 06/29/2016   Procedure: HEMORRHOIDECTOMY;  Surgeon: Christene Lye, MD;  Location: [facility];  Service: General;  Laterality: N/A;  . PAROTIDECTOMY Left 1997  . SALIVARY GLAND SURGERY  1997  . SPHINCTEROTOMY N/A 06/29/2016   Procedure: SPHINCTEROTOMY;  Surgeon: Christene Lye, MD;  Location: [facility];  Service: General;  Laterality: N/A;  . WRIST ARTHROSCOPY Left 01/26/2017   Procedure: ARTHROSCOPY LEFT WRIST DEBRIDEMENT;  Surgeon: Daryll Brod, MD;  Location: [facility];  Service: Orthopedics;  Laterality: Left;  . WRIST SURGERY  2019    There were no vitals filed for this visit.   Subjective Assessment - 12/10/20 0857    Subjective Reports doing well upon arrival today.  Complains of 3/10 pain in left elbow.  Was sore after last session near the areas near where he received dry needling.  Reports at least 30% improvement in symptoms since starting therapy. No specific questions or concerns.    Pertinent History Pt reports that 1 year ago February he started having really bad stiffness in his "whole left elbow" but more so on the lateral aspect. He had a steroid injection that gave him about 3 months of relief but eventually the pain returned. He had a repeat injection but unfortunately that one only gave him 2 weeks of  relief. He has not undergone any additional steroid injections since that time. Pt reports that he feels like the pain has been worsening. He tosses and turns a lot during his sleep. He notes that the pain worsens when holding his cell phone. He complains of occasional numbness in the palmar and dorsal aspects of digits 4-5. He saw orthopedics for his L elbow and they referred him for physical therapy. At the time of his initial consult they reported no evidence of ulnar nerve pathology. He had a heart ablation surgery on 11/11/20 which went well and pt remains on Eliquis through the end of the month. He used to play  a lot of baseball and softball but not currently. He has a history of herniated cervical disc years ago which radiated down the LUE at the time and still gives him occasional pain. He feels like his neck and L shoulder pain may be related to his L elbow issues. He had an MRI scan of his L elbow which showed mild tendinosis and small foci of partial thickness tearing of the common flexor and extensor tendons at their origins. He reports that his primary limitation related to his L elbow pain is lifting. Denies any issues with his RUE.    Limitations House hold activities    Diagnostic tests MRI (see history)    Patient Stated Goals Decrease pain so he can return to pain-free lifting of plates, groceries, etc.    Currently in Pain? Yes    Pain Score 3     Pain Location Elbow    Pain Orientation Left    Pain Descriptors / Indicators Sharp    Pain Type Chronic pain    Pain Onset More than a month ago    Pain Frequency Intermittent              TREATMENT   Ther-ex  Moist heat pack applied to L dorsal forearm over lateral mass during interval history and HEP review with pt in sitting and arm supported to improve tissue extensibility prior to stretches x 5 minutes, skin examined before and after heat without any signs of irritation; L wrist extensor stretch with straight elbow to improve extensibility of wrist extensors 45s hold x 3; L wrist eccentric radial deviation off edge of table 2# dumbbell (DB) 10s hold x 10; L wrist eccentric extension off edge of table 4# DB 10s lowering x 10, green tband 10s hold x 10;   Manual Therapy  IASTM to L wrist extensors (lateral mass) to improve tissue extensibility and improve blood flow. Worked distal to proximal with fanning and sweeping strokes x 5 minutes; L radial head mobilizations PA grade III, 30s/bout x 2 bouts each; Attempted L radial head posterior to anterior grade V manipulation without any cavitation;   Trigger Point Dry Needling  (TDN), unbilled Education performed with patient regarding potential benefit of TDN. Reviewed precautions and risks with patient. Pt provided verbal consent to treatment. Using clean technique with pt in sitting TDN performed to L ECRB with 2, 0.25 x 40 single needle placements with local twitch response (LTR) during both placements. Pistoning technique utilized.     Pt educated throughout session about proper posture and technique with exercises. Improved exercise technique, movement at target joints, use of target muscles after min to mod verbal, visual, tactile cues.    Patient reporting significant improvement in left lateral elbow pain since starting therapy. He has been consistent with his HEP which has helped with his pain.  Continued trigger point dry needling with patient today and performed 2 placements in ECRB.  Also continued with manual techniques.  HEP progressed to left wrist extensor eccentric strengthening with higher weight compared to previously.  Patient also issued Thera-Bands to utilize for eccentric strengthening as appropriate..  Patient encouraged to continue stretches and additional HEP and follow-up as scheduled. Pt will benefit from PT services to address deficits in L elbow pain in order to return to full function at home.                           PT Short Term Goals - 11/27/20 1248      PT SHORT TERM GOAL #1   Title Pt will be independent with HEP in order to improve strength and decrease elbow pain in order to improve pain-free function at home.    Time 4    Period Weeks    Status New    Target Date 12/24/20      PT SHORT TERM GOAL #2   Title Pt will decrease quick DASH score by at least 8% in order to demonstrate clinically significant reduction in disability.    Baseline 11/26/20: 95.5%    Time 4    Period Weeks    Status New    Target Date 12/24/20             PT Long Term Goals - 11/27/20 1249      PT LONG TERM GOAL #1    Title Pt will decrease worst elbow pain as reported on NPRS by at least 3 points in order to demonstrate clinically significant reduction in elbow pain.    Baseline 11/27/20: worst: 10/10    Time 8    Period Weeks    Status New    Target Date 01/21/21      PT LONG TERM GOAL #2   Title Pt will decrease quick DASH score to below 25% in order to demonstrate clinically significant reduction in disability and ability to complete all ADLs without increase in his elbow pain.    Baseline 11/26/20: 95.5%    Time 8    Period Weeks    Status New    Target Date 01/21/21      PT LONG TERM GOAL #3   Title Pt will improve FOTO to at least 52 in order to demonstrate significant improvement in function at home.    Baseline 11/26/20: 31    Time 8    Status New    Target Date 01/21/21      PT LONG TERM GOAL #4   Title Pt will improve L wrist flexion to within 10 degrees of R wrist in order to demonstrate improved extensibility of L wrist extensors to reduce risk of recurrent irritation of lateral epicondylitis    Baseline 11/26/20: R/L 73/35    Time 8    Period Weeks    Status New    Target Date 01/21/21      PT LONG TERM GOAL #5   Title Pt will improve pain-free gripping in LUE to within 15% (78.2#)  of RUE in order to be able to lift plates/groceries and hold cell phone without pain    Baseline 11/26/20: pain-free 14.8#    Time 8    Status New    Target Date 01/22/21                 Plan - 12/10/20 0915  Clinical Impression Statement Patient reporting significant improvement in left lateral elbow pain since starting therapy. He has been consistent with his HEP which has helped with his pain.    Continued trigger point dry needling with patient today and performed 2 placements in ECRB.  Also continued with manual techniques.  HEP progressed to left wrist extensor eccentric strengthening with higher weight compared to previously.  Patient also issued Thera-Bands to utilize for eccentric  strengthening as appropriate..  Patient encouraged to continue stretches and additional HEP and follow-up as scheduled. Pt will benefit from PT services to address deficits in L elbow pain in order to return to full function at home.    Personal Factors and Comorbidities Age;Comorbidity 3+;Past/Current Experience;Time since onset of injury/illness/exacerbation    Comorbidities OA, depression, herniated cervical disck, PTSD    Examination-Activity Limitations Lift    Examination-Participation Restrictions Cleaning;Community Activity;Laundry;Meal Prep;Shop    Stability/Clinical Decision Making Unstable/Unpredictable    Rehab Potential Good    PT Frequency 2x / week    PT Duration 8 weeks    PT Treatment/Interventions ADLs/Self Care Home Management;Aquatic Therapy;Biofeedback;Canalith Repostioning;Cryotherapy;Electrical Stimulation;Iontophoresis 4mg /ml Dexamethasone;Moist Heat;Traction;Ultrasound;Gait training;Stair training;Functional mobility training;Therapeutic activities;Therapeutic exercise;Balance training;Neuromuscular re-education;Cognitive remediation;Patient/family education;Manual techniques;Passive range of motion;Dry needling;Splinting;Taping;Vestibular;Spinal Manipulations;Joint Manipulations    PT Next Visit Plan Review HEP, STM, manual therapy for elbow (MWM), progress isometrics and eventually eccentric strengthening    PT Home Exercise Plan Medbridge Access Code: [email or web address]    Consulted and Agree with Plan of Care Patient           Patient will benefit from skilled therapeutic intervention in order to improve the following deficits and impairments:  Pain,Impaired flexibility,Decreased strength  Visit Diagnosis: Pain in left elbow     Problem List Patient Active Problem List   Diagnosis Date Noted  . Atypical atrial flutter ([place]) 10/29/2020  . Left tennis elbow 07/10/2020  . Weight loss 06/10/2020  . Vitamin D deficiency 04/11/2020  . Gastroesophageal reflux disease  without esophagitis 04/11/2020  . Anxiety 04/11/2020  . Pain in left wrist 12/13/2019  . Pain in left elbow 12/13/2019  . Neuropathy 04/12/2019  . Low vitamin B12 level 02/20/2019  . Leukocytosis 02/12/2019  . Right-sided chest pain 02/12/2019  . Neck pain 12/07/2018  . HA (headache)   . Pain in soft tissues of limb   . Lumbago   . Gynecomastia, male 07/12/2013   Phillips Grout PT, DPT, GCS  Brieanne Mignone 12/10/2020, 1:35 PM  [facility] [address]. [place], [place], [address] Phone: [phone]   Fax:  [phone]  Name: Adam Holloway MRN: [id number] Date of Birth: 23-May-1971

## 2020-12-11 ENCOUNTER — Encounter: Payer: Self-pay | Admitting: Cardiology

## 2020-12-11 ENCOUNTER — Ambulatory Visit (INDEPENDENT_AMBULATORY_CARE_PROVIDER_SITE_OTHER): Payer: Medicare Other | Admitting: Cardiology

## 2020-12-11 VITALS — BP 120/80 | HR 67 | Ht 73.0 in | Wt 226.4 lb

## 2020-12-11 DIAGNOSIS — I483 Typical atrial flutter: Secondary | ICD-10-CM | POA: Diagnosis not present

## 2020-12-11 NOTE — Progress Notes (Signed)
Electrophysiology Office Follow up Visit Note:    Date:  12/11/2020   ID:  Adam Holloway, DOB [birth year], MRN [id number]  PCP:  [name], MD  [facility] Cardiologist:  Kate Sable, MD  [facility] Electrophysiologist:  Vickie Epley, MD    Interval History:    Adam Holloway is a 50 y.o. male who presents for a follow up visit after successful atrial flutter ablation on November 11, 2020.  He has done well since that procedure without recurrence of atrial flutter.  He tells me that he still feels poorly after he eats a large meal but has adapted his eating habits to minimize the symptoms.  He has not had a sustained episode of arrhythmia since the ablation procedure.  He continues to take Eliquis without bleeding complication.    Past Medical History:  Diagnosis Date  . Arthritis    back, left wrist, left knee  . Complication of anesthesia    bp dropped during gallbladder surgery in 2008  . Depression   . Family history of adverse reaction to anesthesia    mom stopped breathing during colonscopy  . GERD (gastroesophageal reflux disease)    h/o  . HA (headache)    h/o migrianes  . Herniated disc, cervical   . History of kidney stones    h/o stones  . History of leukocytosis   . Lumbago   . Pain in soft tissues of limb   . Pancreatitis   . Peripheral neuropathy 04/12/2019  . PTSD (post-traumatic stress disorder)   . Scoliosis     Past Surgical History:  Procedure Laterality Date  . A-FLUTTER ABLATION N/A 11/11/2020   Procedure: A-FLUTTER ABLATION;  Surgeon: Vickie Epley, MD;  Location: [facility];  Service: Cardiovascular;  Laterality: N/A;  . CHOLECYSTECTOMY  2008  . COLONOSCOPY  2010  . COLONOSCOPY WITH PROPOFOL N/A 06/09/2016   Procedure: COLONOSCOPY WITH PROPOFOL;  Surgeon: Christene Lye, MD;  Location: [facility];  Service: Endoscopy;  Laterality: N/A;  . FISSURECTOMY N/A 06/29/2016   Procedure: FISSURECTOMY;   Surgeon: Christene Lye, MD;  Location: [facility];  Service: General;  Laterality: N/A;  . HEMORRHOID SURGERY N/A 06/29/2016   Procedure: HEMORRHOIDECTOMY;  Surgeon: Christene Lye, MD;  Location: [facility];  Service: General;  Laterality: N/A;  . PAROTIDECTOMY Left 1997  . SALIVARY GLAND SURGERY  1997  . SPHINCTEROTOMY N/A 06/29/2016   Procedure: SPHINCTEROTOMY;  Surgeon: Christene Lye, MD;  Location: [facility];  Service: General;  Laterality: N/A;  . WRIST ARTHROSCOPY Left 01/26/2017   Procedure: ARTHROSCOPY LEFT WRIST DEBRIDEMENT;  Surgeon: Daryll Brod, MD;  Location: [facility];  Service: Orthopedics;  Laterality: Left;  . WRIST SURGERY  2019    Current Medications: Current Meds  Medication Sig  . acetaminophen (TYLENOL) 325 MG tablet Take 325 mg by mouth every 6 (six) hours as needed for moderate pain.  Marland Kitchen apixaban (ELIQUIS) 5 MG TABS tablet Take 1 tablet (5 mg total) by mouth 2 (two) times daily.  Marland Kitchen atorvastatin (LIPITOR) 20 MG tablet Take 1 tablet (20 mg total) by mouth daily.  . calcium carbonate (TUMS - DOSED IN MG ELEMENTAL CALCIUM) 500 MG chewable tablet Chew 500 mg by mouth daily as needed for indigestion or heartburn.  . diazepam (VALIUM) 5 MG tablet TAKE 1 TAB BY MOUTH DAILY  . Oxycodone HCl 10 MG TABS Take 5 mg by mouth 4 (four) times daily as needed for pain.  Marland Kitchen  pantoprazole (PROTONIX) 40 MG tablet Take 1 tablet (40 mg total) by mouth daily.  . Vitamin D, Ergocalciferol, (DRISDOL) 1.25 MG (50000 UNIT) CAPS capsule Take 50,000 Units by mouth every Wednesday.     Allergies:   Augmentin [amoxicillin-pot clavulanate], Atorvastatin, Nsaids, Tolmetin, and Meloxicam   Social History   Socioeconomic History  . Marital status: Single    Spouse name: Not on file  . Number of children: 1  . Years of education: GED  . Highest education level: Not on file  Occupational History    Comment: Disabled  Tobacco Use  . Smoking status: Former Smoker     Packs/day: 0.50    Years: 32.00    Pack years: 16.00    Types: Cigarettes    Quit date: 11/08/2020    Years since quitting: 0.0  . Smokeless tobacco: Former Systems developer  . Tobacco comment: Quit 2020 and restarted 10/2019 1-2 cig/day  States he quit 2 days before procedure 2022  Vaping Use  . Vaping Use: Never used  Substance and Sexual Activity  . Alcohol use: No    Alcohol/week: 0.0 standard drinks  . Drug use: No  . Sexual activity: Not on file  Other Topics Concern  . Not on file  Social History Narrative   Patient lives at home with his mother.   Disabled.   Education GED.   Right handed.   Caffeine two cups of coffee daily.   Social Determinants of Health   Financial Resource Strain: Not on file  Food Insecurity: Not on file  Transportation Needs: Not on file  Physical Activity: Not on file  Stress: Not on file  Social Connections: Not on file     Family History: The patient's family history includes Cancer in his father; Colon cancer in his mother; Heart Problems in his father; Liver cancer in his brother; Lung cancer in his father.  ROS:   Please see the history of present illness.    All other systems reviewed and are negative.  EKGs/Labs/Other Studies Reviewed:    The following studies were reviewed today:   EKG:  The ekg ordered today demonstrates sinus rhythm.  Recent Labs: 11/08/2020: ALT 26; BUN 15; Creatinine, Ser 1.00; Hemoglobin 15.5; Platelets 195; Potassium 4.6; Sodium 137  Recent Lipid Panel    Component Value Date/Time   CHOL 193 08/20/2020 1005   TRIG 96 08/20/2020 1005   HDL 44 08/20/2020 1005   CHOLHDL 4.4 08/20/2020 1005   VLDL 19 08/20/2020 1005   LDLCALC 130 (H) 08/20/2020 1005    Physical Exam:    VS:  BP 120/80 (BP Location: Left Arm, Patient Position: Sitting, Cuff Size: Normal)   Pulse 67   Ht 6\' 1"  (1.854 m)   Wt 226 lb 6 oz (102.7 kg)   SpO2 97%   BMI 29.87 kg/m     Wt Readings from Last 3 Encounters:  12/11/20 226 lb  6 oz (102.7 kg)  11/28/20 230 lb (104.3 kg)  11/11/20 226 lb (102.5 kg)     GEN:  Well nourished, well developed in no acute distress HEENT: Normal NECK: No JVD; No carotid bruits LYMPHATICS: No lymphadenopathy CARDIAC: RRR, no murmurs, rubs, gallops RESPIRATORY:  Clear to auscultation without rales, wheezing or rhonchi  ABDOMEN: Soft, non-tender, non-distended MUSCULOSKELETAL:  No edema; No deformity  SKIN: Warm and dry NEUROLOGIC:  Alert and oriented x 3 PSYCHIATRIC:  Normal affect   ASSESSMENT:    1. Atypical atrial flutter ([facility])    PLAN:  In order of problems listed above:  1. Atrial flutter Patient is post successful typical flutter ablation on November 11, 2020.  He has not had a recurrence of his arrhythmia.  He continues to take Eliquis for stroke prophylaxis without bleeding complication.  He is interested in a long-term strategy that avoids long-term anticoagulant use.  We discussed options for monitoring his rhythm.  We will plan to implant a loop recorder for ongoing surveillance for atrial fibrillation.  We will plan to get this done in the next few weeks.  At that appointment, he can discontinue his Eliquis and we will continue monitoring for recurrence of arrhythmia via the loop recorder.  His GI upset symptoms I suspect are related to his history of gallbladder removal and dietary triggers such as high-fat meals.  He tells me that pizza is a big trigger for his symptoms.  I do not suspect these are related to arrhythmia but again, the loop recorder will help tease this out further.   Medication Adjustments/Labs and Tests Ordered: Current medicines are reviewed at length with the patient today.  Concerns regarding medicines are outlined above.  Orders Placed This Encounter  Procedures  . EKG 12-Lead   No orders of the defined types were placed in this encounter.    Signed, Lars Mage, MD, [facility]  12/11/2020 10:29 AM    Electrophysiology [facility]

## 2020-12-11 NOTE — Patient Instructions (Incomplete)
TREATMENT   Ther-ex Moist heat pack applied to L dorsal forearm over lateral mass during interval history and HEP review with pt in sitting and arm supported to improve tissue extensibility prior to stretches x 5 minutes, skin examined before and after heat without any signs of irritation; L wrist extensor stretch with straight elbow to improve extensibility of wrist extensors 45s hold x 3; L wrist eccentric radial deviation off edge of table 2# dumbbell (DB) 10s hold x 10; L wrist eccentric extension off edge of table 4# DB 10s lowering x 10, green tband 10s hold x 10;   Manual Therapy IASTM to L wrist extensors (lateral mass) to improve tissue extensibility and improve blood flow. Worked distal to proximal with fanning and sweeping strokes x 5 minutes; L radial head mobilizations PA grade III, 30s/bout x 2 bouts each; Attempted L radial head posterior to anterior grade V manipulation without any cavitation;   Trigger Point Dry Needling (TDN), unbilled Education performed with patient regarding potential benefit of TDN. Reviewed precautions and risks with patient. Pt provided verbal consent to treatment.Using clean technique with pt in sittingTDN performed to L ECRBwith 2,0.25 x 40 single needle placements with local twitch response (LTR) during both placements. Pistoning technique utilized.   Pt educated throughout session about proper posture and technique with exercises. Improved exercise technique, movement at target joints, use of target muscles after min to mod verbal, visual, tactile cues.   Patient reporting significant improvement in left lateral elbow pain since starting therapy.He has been consistent with his HEP which has helped with his pain.   Continued trigger point dry needling with patient today and performed 2 placements in ECRB. Also continued with manual techniques. HEP progressed to left wrist extensor eccentric strengthening with higher weight  compared to previously.  Patient also issued Thera-Bands to utilize for eccentric strengthening as appropriate.. Patient encouraged to continue stretches and additional HEP and follow-up as scheduled. Pt will benefit from PT services to address deficits inL elbow painin order to return to full function at home.

## 2020-12-11 NOTE — Patient Instructions (Addendum)
Medication Instructions:  Your physician recommends that you continue on your current medications as directed. Please refer to the Current Medication list given to you today. *If you need a refill on your cardiac medications before your next appointment, please call your pharmacy*  Lab Work: None ordered. If you have labs (blood work) drawn today and your tests are completely normal, you will receive your results only by: Marland Kitchen MyChart Message (if you have MyChart) OR . A paper copy in the mail If you have any lab test that is abnormal or we need to change your treatment, we will call you to review the results.  Testing/Procedures: None ordered.  Follow-Up: At [facility], you and your health needs are our priority.  As part of our continuing mission to provide you with exceptional heart care, we have created designated Provider Care Teams.  These Care Teams include your primary Cardiologist (physician) and Advanced Practice Providers (APPs -  Physician Assistants and Nurse Practitioners) who all work together to provide you with the care you need, when you need it.  Your next appointment:   Your physician wants you to follow-up January 01, 2021 at 11:40 am at the [facility] office with Dr. Quentin Ore   Implantable loop recorder placement-there are NO special instructions for this procedure   Implantable Loop Recorder Placement  An implantable loop recorder is a small electronic device that is placed under the skin of your chest. The device records the electrical activity of your heart over a long period of time. Your health care provider can download these recordings to monitor your heart. You may need an implantable loop recorder if you have periods of abnormal heart activity (arrhythmias) or unexplained fainting (syncope). The recorder can be left in place for 1 year or longer.

## 2020-12-12 ENCOUNTER — Other Ambulatory Visit: Payer: Self-pay

## 2020-12-12 ENCOUNTER — Ambulatory Visit: Payer: Medicare Other

## 2020-12-12 DIAGNOSIS — M25522 Pain in left elbow: Secondary | ICD-10-CM | POA: Diagnosis not present

## 2020-12-12 NOTE — Therapy (Signed)
[facility] [address] [place], [place], [address] Phone: [phone]   Fax:  [phone]  Physical Therapy Treatment/Goal Update  Patient Details  Name: Adam Holloway MRN: [id number] Date of Birth: 1971/03/08 Referring Provider (PT): Joni Fears   Encounter Date: 12/12/2020   PT End of Session - 12/12/20 0833    Visit Number 4    Number of Visits 17    Date for PT Re-Evaluation 01/21/21    Authorization Type eval: 11/26/20    PT Start Time 0840    PT Stop Time 0925    PT Time Calculation (min) 45 min    Activity Tolerance Patient tolerated treatment well    Behavior During Therapy [place] for tasks assessed/performed           Past Medical History:  Diagnosis Date  . Arthritis    back, left wrist, left knee  . Complication of anesthesia    bp dropped during gallbladder surgery in 2008  . Depression   . Family history of adverse reaction to anesthesia    mom stopped breathing during colonscopy  . GERD (gastroesophageal reflux disease)    h/o  . HA (headache)    h/o migrianes  . Herniated disc, cervical   . History of kidney stones    h/o stones  . History of leukocytosis   . Lumbago   . Pain in soft tissues of limb   . Pancreatitis   . Peripheral neuropathy 04/12/2019  . PTSD (post-traumatic stress disorder)   . Scoliosis     Past Surgical History:  Procedure Laterality Date  . A-FLUTTER ABLATION N/A 11/11/2020   Procedure: A-FLUTTER ABLATION;  Surgeon: Vickie Epley, MD;  Location: [facility];  Service: Cardiovascular;  Laterality: N/A;  . CHOLECYSTECTOMY  2008  . COLONOSCOPY  2010  . COLONOSCOPY WITH PROPOFOL N/A 06/09/2016   Procedure: COLONOSCOPY WITH PROPOFOL;  Surgeon: Christene Lye, MD;  Location: [facility];  Service: Endoscopy;  Laterality: N/A;  . FISSURECTOMY N/A 06/29/2016   Procedure: FISSURECTOMY;  Surgeon: Christene Lye, MD;  Location: [facility];  Service:  General;  Laterality: N/A;  . HEMORRHOID SURGERY N/A 06/29/2016   Procedure: HEMORRHOIDECTOMY;  Surgeon: Christene Lye, MD;  Location: [facility];  Service: General;  Laterality: N/A;  . PAROTIDECTOMY Left 1997  . SALIVARY GLAND SURGERY  1997  . SPHINCTEROTOMY N/A 06/29/2016   Procedure: SPHINCTEROTOMY;  Surgeon: Christene Lye, MD;  Location: [facility];  Service: General;  Laterality: N/A;  . WRIST ARTHROSCOPY Left 01/26/2017   Procedure: ARTHROSCOPY LEFT WRIST DEBRIDEMENT;  Surgeon: Daryll Brod, MD;  Location: [facility];  Service: Orthopedics;  Laterality: Left;  . WRIST SURGERY  2019    There were no vitals filed for this visit.   Subjective Assessment - 12/12/20 0832    Subjective Reports doing well upon arrival today. Denies pain currently. Was sore after last session but resolved and overall he is reporting minimal pain over the last couple days. Reports at least 80% improvement in symptoms since starting therapy. No specific questions or concerns.    Pertinent History Pt reports that 1 year ago February he started having really bad stiffness in his "whole left elbow" but more so on the lateral aspect. He had a steroid injection that gave him about 3 months of relief but eventually the pain returned. He had a repeat injection but unfortunately that one only gave him 2 weeks of relief.  He has not undergone any additional steroid injections since that time. Pt reports that he feels like the pain has been worsening. He tosses and turns a lot during his sleep. He notes that the pain worsens when holding his cell phone. He complains of occasional numbness in the palmar and dorsal aspects of digits 4-5. He saw orthopedics for his L elbow and they referred him for physical therapy. At the time of his initial consult they reported no evidence of ulnar nerve pathology. He had a heart ablation surgery on 11/11/20 which went well and pt remains on Eliquis through the end of the  month. He used to play a lot of baseball and softball but not currently. He has a history of herniated cervical disc years ago which radiated down the LUE at the time and still gives him occasional pain. He feels like his neck and L shoulder pain may be related to his L elbow issues. He had an MRI scan of his L elbow which showed mild tendinosis and small foci of partial thickness tearing of the common flexor and extensor tendons at their origins. He reports that his primary limitation related to his L elbow pain is lifting. Denies any issues with his RUE.    Limitations House hold activities    Diagnostic tests MRI (see history)    Patient Stated Goals Decrease pain so he can return to pain-free lifting of plates, groceries, etc.    Currently in Pain? No/denies               TREATMENT   Ther-ex Moist heat pack applied to L dorsal forearm over lateral mass during interval history and HEP review with pt in sitting and arm supported to improve tissue extensibility prior to stretches x 5 minutes, skin examined before and after heat without any signs of irritation; L wrist extensor stretch with straight elbow to improve extensibility of wrist extensors 45s hold x 2; L wrist extension off edge of table 4# dumbbell (DB) 2 x 15, pain-free; L wrist radial deviation off edge of table 4# DB 2 x 15, pain-free  Updated outcome measures with patient: FOTO: 64 QuickDASH: 40.9% Worst pain in L elbow: 2/10; L pain-free grip: 64.4#   Manual Therapy IASTM to L wrist extensors (lateral mass) to improve tissue extensibility and improve blood flow. Worked distal to proximal with fanning and sweeping strokes x 5 minutes; L radial head mobilizations A/P and PA grade III, 30s/bout x 3 bouts each; L forearm mobilization with movement with medial to lateral glide ulna/radius on humerus using belt and pt performing pain-free gripping during glide 3 x 10;   Pt educated throughout session about proper  posture and technique with exercises. Improved exercise technique, movement at target joints, use of target muscles after min to mod verbal, visual, tactile cues.   Patient reporting around 80% improvement in left lateral elbow pain since starting therapy.He has been consistent with his HEP which has helped considerably. Updated outcome measures and goals with patient today. His FOTO improved from 31 at initial evaluation to 64 today and his QuickDASH has decreased from 95.5% to 40.9%. His worst pain over the last few days has been 2/10 compared to 10/10 initially. Pain-free grip has improved from 14.8# initially to 64.4#. Deferred trigger point dry needling with patient today since it was performed earlier this week. Continued with radial head mobilizations as well as mobilization with movement during gripping. HEP progressed to left wrist concentric extension and radial deviation strengthening.  Patient encouraged to continue stretches and additional HEP and follow-up as scheduled. He will benefit from PT services to address deficits inL elbow painin order to return to full function at home.                          PT Short Term Goals - 12/12/20 0946      PT SHORT TERM GOAL #1   Title Pt will be independent with HEP in order to improve strength and decrease elbow pain in order to improve pain-free function at home.    Time 4    Period Weeks    Status Achieved    Target Date 12/24/20      PT SHORT TERM GOAL #2   Title Pt will decrease quick DASH score by at least 8% in order to demonstrate clinically significant reduction in disability.    Baseline 11/26/20: 95.5%; 12/12/20: 40.9%    Time 4    Period Weeks    Status Achieved    Target Date 12/24/20             PT Long Term Goals - 12/12/20 0921      PT LONG TERM GOAL #1   Title Pt will decrease worst elbow pain as reported on NPRS by at least 3 points in order to demonstrate clinically significant reduction in  elbow pain.    Baseline 11/27/20: worst: 10/10; 12/12/20: 2/10;    Time 8    Period Weeks    Status Achieved      PT LONG TERM GOAL #2   Title Pt will decrease quick DASH score to below 25% in order to demonstrate clinically significant reduction in disability and ability to complete all ADLs without increase in his elbow pain.    Baseline 11/26/20: 95.5%; 12/12/20: 40.9%    Time 8    Period Weeks    Status Partially Met    Target Date 01/21/21      PT LONG TERM GOAL #3   Title Pt will improve FOTO to at least 52 in order to demonstrate significant improvement in function at home.    Baseline 11/26/20: 31; 12/12/20: 64;    Time 8    Period Weeks    Status Achieved      PT LONG TERM GOAL #4   Title Pt will improve L wrist flexion to within 10 degrees of R wrist in order to demonstrate improved extensibility of L wrist extensors to reduce risk of recurrent irritation of lateral epicondylitis    Baseline 11/26/20: R/L 73/35    Time 8    Period Weeks    Status Deferred    Target Date 01/21/21      PT LONG TERM GOAL #5   Title Pt will improve pain-free gripping in LUE to within 15% (78.2#)  of RUE in order to be able to lift plates/groceries and hold cell phone without pain    Baseline 11/26/20: pain-free 14.8#; 12/12/20: 64.4#    Time 8    Period Weeks    Status Partially Met    Target Date 01/22/21                 Plan - 12/12/20 0833    Clinical Impression Statement Patient reporting around 80% improvement in left lateral elbow pain since starting therapy. He has been consistent with his HEP which has helped considerably. Updated outcome measures and goals with patient today. His FOTO improved from 31 at  initial evaluation to 64 today and his QuickDASH has decreased from 95.5% to 40.9%. His worst pain over the last few days has been 2/10 compared to 10/10 initially. Pain-free grip has improved from 14.8# initially to 64.4#. Deferred trigger point dry needling with patient today  since it was performed earlier this week. Continued with radial head mobilizations as well as mobilization with movement during gripping. HEP progressed to left wrist concentric extension and radial deviation strengthening. Patient encouraged to continue stretches and additional HEP and follow-up as scheduled. He will benefit from PT services to address deficits in L elbow pain in order to return to full function at home.    Personal Factors and Comorbidities Age;Comorbidity 3+;Past/Current Experience;Time since onset of injury/illness/exacerbation    Comorbidities OA, depression, herniated cervical disck, PTSD    Examination-Activity Limitations Lift    Examination-Participation Restrictions Cleaning;Community Activity;Laundry;Meal Prep;Shop    Stability/Clinical Decision Making Unstable/Unpredictable    Rehab Potential Good    PT Frequency 2x / week    PT Duration 8 weeks    PT Treatment/Interventions ADLs/Self Care Home Management;Aquatic Therapy;Biofeedback;Canalith Repostioning;Cryotherapy;Electrical Stimulation;Iontophoresis 1m/ml Dexamethasone;Moist Heat;Traction;Ultrasound;Gait training;Stair training;Functional mobility training;Therapeutic activities;Therapeutic exercise;Balance training;Neuromuscular re-education;Cognitive remediation;Patient/family education;Manual techniques;Passive range of motion;Dry needling;Splinting;Taping;Vestibular;Spinal Manipulations;Joint Manipulations    PT Next Visit Plan Review HEP, STM, manual therapy for elbow (MWM), progress isometrics and eventually eccentric strengthening    PT Home Exercise Plan Medbridge Access Code: [email or web address]   Consulted and Agree with Plan of Care Patient           Patient will benefit from skilled therapeutic intervention in order to improve the following deficits and impairments:  Pain,Impaired flexibility,Decreased strength  Visit Diagnosis: Pain in left elbow     Problem List Patient Active Problem List   Diagnosis  Date Noted  . Atypical atrial flutter ([facility] 10/29/2020  . Left tennis elbow 07/10/2020  . Weight loss 06/10/2020  . Vitamin D deficiency 04/11/2020  . Gastroesophageal reflux disease without esophagitis 04/11/2020  . Anxiety 04/11/2020  . Pain in left wrist 12/13/2019  . Pain in left elbow 12/13/2019  . Neuropathy 04/12/2019  . Low vitamin B12 level 02/20/2019  . Leukocytosis 02/12/2019  . Right-sided chest pain 02/12/2019  . Neck pain 12/07/2018  . HA (headache)   . Pain in soft tissues of limb   . Lumbago   . Gynecomastia, male 07/12/2013   JPhillips GroutPT, DPT, GCS  Jaymarion Trombly 12/12/2020, 9:56 AM  [place] [facility] [place] [address]Phone: [phone]  Fax:  [phone] Name: BJOHNATHYN VISCOMIMRN: [id number] of Birth: 2January 22, 1972

## 2020-12-16 NOTE — Patient Instructions (Incomplete)
TREATMENT   Ther-ex Moist heat pack applied to L dorsal forearm over lateral mass during interval history and HEP review with pt in sitting and arm supported to improve tissue extensibility prior to stretches x 5 minutes, skinexamined before and after heatwithout any signs of irritation; L wrist extensor stretch with straight elbow to improve extensibility of wrist extensors 45s hold x 2; L wrist extension off edge of table4# dumbbell (DB)2 x 15, pain-free; L wristradial deviation off edge of table 4# DB2 x 15, pain-free  Updated outcome measures with patient: FOTO: 64 QuickDASH: 40.9% Worst pain in L elbow: 2/10; L pain-free grip: 64.4#   Manual Therapy IASTM to L wrist extensors (lateral mass) to improve tissue extensibility and improve blood flow. Worked distal to proximal with fanning and sweeping strokes x 5 minutes; L radial head mobilizations A/P and PA grade III, 30s/bout x 3 bouts each; L forearm mobilization with movement with medial to lateral glide ulna/radius on humerus using belt and pt performing pain-free gripping during glide 3 x 10;   Trigger Point Dry Needling (TDN), unbilled Education performed with patient regarding potential benefit of TDN. Reviewed precautions and risks with patient. Pt provided verbal consent to treatment.Using clean technique with pt in sittingTDN performed to L ECRBwith 2,0.25 x 40 single needle placements with local twitch response (LTR) during both placements. Pistoning technique utilized.   Pt educated throughout session about proper posture and technique with exercises. Improved exercise technique, movement at target joints, use of target muscles after min to mod verbal, visual, tactile cues.   Patientreporting around 80% improvement in left lateral elbow pain since starting therapy.He has been consistent with his HEP which has helped considerably. Updated outcome measures and goals with patient today. His FOTO  improved from 31 at initial evaluation to 64 today and his QuickDASH has decreased from 95.5% to 40.9%. His worst pain over the last few days has been 2/10 compared to 10/10 initially. Pain-free grip has improved from 14.8# initially to 64.4#. Deferred trigger point dry needling with patient today since it was performed earlier this week. Continued with radial head mobilizations as well as mobilization with movement during gripping.HEP progressed to left wrist concentric extension and radial deviation strengthening.Patient encouraged to continue stretches and additional HEP and follow-up as scheduled. He will benefit from PT services to address deficits inL elbow painin order to return to full function at home.

## 2020-12-17 ENCOUNTER — Ambulatory Visit: Payer: Medicare Other

## 2020-12-17 ENCOUNTER — Other Ambulatory Visit: Payer: Self-pay

## 2020-12-17 ENCOUNTER — Ambulatory Visit (INDEPENDENT_AMBULATORY_CARE_PROVIDER_SITE_OTHER): Payer: Medicare Other

## 2020-12-17 DIAGNOSIS — R002 Palpitations: Secondary | ICD-10-CM | POA: Diagnosis not present

## 2020-12-17 DIAGNOSIS — M25522 Pain in left elbow: Secondary | ICD-10-CM

## 2020-12-17 NOTE — Patient Instructions (Signed)
Keep follow up appointment as scheduled for loop implant.

## 2020-12-17 NOTE — Progress Notes (Signed)
1.) Reason for visit: palpitations  2.) Name of MD requesting visit: Dr. Quentin Ore  3.) H&P: Pt is s/p aflutter ablation.  Requested EKG for palpitations.  4.) ROS related to problem: Pt c/o of palpitations.  Will obtain EKG.  Pt previously scheduled for loop implant to monitor for arrhythmias.  5.) Assessment and plan per MD: EKG reviewed.  Sinus rhythm.  Provided reassurance regarding "palpitations".  Advised loop monitor would identify if symptoms are cardiac related.  Pt will call if he would like sooner loop implant.

## 2020-12-18 ENCOUNTER — Ambulatory Visit: Payer: Medicare Other

## 2020-12-19 ENCOUNTER — Other Ambulatory Visit: Payer: Self-pay | Admitting: Physician Assistant

## 2020-12-19 ENCOUNTER — Ambulatory Visit: Payer: Medicare Other | Admitting: Cardiology

## 2020-12-19 DIAGNOSIS — K219 Gastro-esophageal reflux disease without esophagitis: Secondary | ICD-10-CM

## 2020-12-19 NOTE — Patient Instructions (Incomplete)
TREATMENT   Ther-ex Moist heat pack applied to L dorsal forearm over lateral mass during interval history and HEP review with pt in sitting and arm supported to improve tissue extensibility prior to stretches x 5 minutes, skinexamined before and after heatwithout any signs of irritation; L wrist extensor stretch with straight elbow to improve extensibility of wrist extensors 45s hold x 2; L wrist extension off edge of table4# dumbbell (DB)2 x 15, pain-free; L wristradial deviation off edge of table 4# DB2 x 15, pain-free  Updated outcome measures with patient: FOTO: 64 QuickDASH: 40.9% Worst pain in L elbow: 2/10; L pain-free grip: 64.4#   Manual Therapy IASTM to L wrist extensors (lateral mass) to improve tissue extensibility and improve blood flow. Worked distal to proximal with fanning and sweeping strokes x 5 minutes; L radial head mobilizations A/P and PA grade III, 30s/bout x 3 bouts each; L forearm mobilization with movement with medial to lateral glide ulna/radius on humerus using belt and pt performing pain-free gripping during glide 3 x 10;   Pt educated throughout session about proper posture and technique with exercises. Improved exercise technique, movement at target joints, use of target muscles after min to mod verbal, visual, tactile cues.   Patientreporting around 80% improvement in left lateral elbow pain since starting therapy.He has been consistent with his HEP which has helped considerably. Updated outcome measures and goals with patient today. His FOTO improved from 31 at initial evaluation to 64 today and his QuickDASH has decreased from 95.5% to 40.9%. His worst pain over the last few days has been 2/10 compared to 10/10 initially. Pain-free grip has improved from 14.8# initially to 64.4#. Deferred trigger point dry needling with patient today since it was performed earlier this week. Continued with radial head mobilizations as well as mobilization  with movement during gripping.HEP progressed to left wrist concentric extension and radial deviation strengthening.Patient encouraged to continue stretches and additional HEP and follow-up as scheduled. He will benefit from PT services to address deficits inL elbow painin order to return to full function at home.

## 2020-12-24 ENCOUNTER — Ambulatory Visit (INDEPENDENT_AMBULATORY_CARE_PROVIDER_SITE_OTHER): Payer: Medicare Other | Admitting: Cardiology

## 2020-12-24 ENCOUNTER — Other Ambulatory Visit: Payer: Self-pay

## 2020-12-24 ENCOUNTER — Ambulatory Visit: Payer: Medicare Other

## 2020-12-24 ENCOUNTER — Encounter: Payer: Self-pay | Admitting: Cardiology

## 2020-12-24 VITALS — BP 126/80 | HR 94 | Ht 73.0 in | Wt 225.4 lb

## 2020-12-24 DIAGNOSIS — I209 Angina pectoris, unspecified: Secondary | ICD-10-CM | POA: Diagnosis not present

## 2020-12-24 DIAGNOSIS — I483 Typical atrial flutter: Secondary | ICD-10-CM | POA: Diagnosis not present

## 2020-12-24 DIAGNOSIS — K219 Gastro-esophageal reflux disease without esophagitis: Secondary | ICD-10-CM

## 2020-12-24 DIAGNOSIS — M25522 Pain in left elbow: Secondary | ICD-10-CM

## 2020-12-24 MED ORDER — ELIQUIS 5 MG PO TABS: 5.0000 mg | ORAL_TABLET | Freq: Two times a day (BID) | ORAL | 6 refills | Status: DC

## 2020-12-24 NOTE — Progress Notes (Signed)
Electrophysiology Office Follow up Visit Note:    Date:  12/24/2020   ID:  Adam Holloway, DOB April 15, 1971, MRN [id number]  PCP:  [name], MD  [facility] Cardiologist:  Kate Sable, MD  [facility] Electrophysiologist:  Adam Epley, MD    Interval History:    Adam Holloway is a 50 y.o. male who presents for a follow up visit.   I last saw the patient December 11, 2020 after his atrial flutter ablation which was performed November 11, 2020.  He continues to complain of "poor blood flow" that he attributes to a heart abnormality and possibly an arrhythmia.  In the past when he is experienced these symptoms EKGs have shown normal rhythm.  Today he tells me that he thinks his symptoms may be related to the blood thinner.  1 day he took his blood thinner late and he felt really good until shortly after he had taken his blood thinner.  He tells me that his symptoms are related to poor blood flow today.  He sometimes feels his heart beating a little faster but it never goes above 110 bpm.  Typically is in the 70s or 80s when he is having the symptoms.    Past Medical History:  Diagnosis Date  . Arthritis    back, left wrist, left knee  . Complication of anesthesia    bp dropped during gallbladder surgery in 2008  . Depression   . Family history of adverse reaction to anesthesia    mom stopped breathing during colonscopy  . GERD (gastroesophageal reflux disease)    h/o  . HA (headache)    h/o migrianes  . Herniated disc, cervical   . History of kidney stones    h/o stones  . History of leukocytosis   . Lumbago   . Pain in soft tissues of limb   . Pancreatitis   . Peripheral neuropathy 04/12/2019  . PTSD (post-traumatic stress disorder)   . Scoliosis     Past Surgical History:  Procedure Laterality Date  . A-FLUTTER ABLATION N/A 11/11/2020   Procedure: A-FLUTTER ABLATION;  Surgeon: Adam Epley, MD;  Location: [facility];  Service:  Cardiovascular;  Laterality: N/A;  . CHOLECYSTECTOMY  2008  . COLONOSCOPY  2010  . COLONOSCOPY WITH PROPOFOL N/A 06/09/2016   Procedure: COLONOSCOPY WITH PROPOFOL;  Surgeon: Christene Lye, MD;  Location: [facility];  Service: Endoscopy;  Laterality: N/A;  . FISSURECTOMY N/A 06/29/2016   Procedure: FISSURECTOMY;  Surgeon: Christene Lye, MD;  Location: [facility];  Service: General;  Laterality: N/A;  . HEMORRHOID SURGERY N/A 06/29/2016   Procedure: HEMORRHOIDECTOMY;  Surgeon: Christene Lye, MD;  Location: [facility];  Service: General;  Laterality: N/A;  . PAROTIDECTOMY Left 1997  . SALIVARY GLAND SURGERY  1997  . SPHINCTEROTOMY N/A 06/29/2016   Procedure: SPHINCTEROTOMY;  Surgeon: Christene Lye, MD;  Location: [facility];  Service: General;  Laterality: N/A;  . WRIST ARTHROSCOPY Left 01/26/2017   Procedure: ARTHROSCOPY LEFT WRIST DEBRIDEMENT;  Surgeon: Daryll Brod, MD;  Location: [place];  Service: Orthopedics;  Laterality: Left;  . WRIST SURGERY  2019    Current Medications: No outpatient medications have been marked as taking for the 12/24/20 encounter (Office Visit) with Adam Epley, MD.     Allergies:   Augmentin [amoxicillin-pot clavulanate], Atorvastatin, Nsaids, Tolmetin, and Meloxicam   Social History   Socioeconomic History  . Marital status: Single    Spouse  name: Not on file  . Number of children: 1  . Years of education: GED  . Highest education level: Not on file  Occupational History    Comment: Disabled  Tobacco Use  . Smoking status: Former Smoker    Packs/day: 0.50    Years: 32.00    Pack years: 16.00    Types: Cigarettes    Quit date: 11/08/2020    Years since quitting: 0.1  . Smokeless tobacco: Former Systems developer  . Tobacco comment: Quit 2020 and restarted 10/2019 1-2 cig/day  States he quit 2 days before procedure 2022  Vaping Use  . Vaping Use: Never used  Substance and Sexual Activity  . Alcohol use: No     Alcohol/week: 0.0 standard drinks  . Drug use: No  . Sexual activity: Not on file  Other Topics Concern  . Not on file  Social History Narrative   Patient lives at home with his mother.   Disabled.   Education GED.   Right handed.   Caffeine two cups of coffee daily.   Social Determinants of Health   Financial Resource Strain: Not on file  Food Insecurity: Not on file  Transportation Needs: Not on file  Physical Activity: Not on file  Stress: Not on file  Social Connections: Not on file     Family History: The patient's family history includes Cancer in his father; Colon cancer in his mother; Heart Problems in his father; Liver cancer in his brother; Lung cancer in his father.  ROS:   Please see the history of present illness.    All other systems reviewed and are negative.  EKGs/Labs/Other Studies Reviewed:    The following studies were reviewed today:    EKG:  The ekg ordered today demonstrates normal sinus rhythm.  Recent Labs: 11/08/2020: ALT 26; BUN 15; Creatinine, Ser 1.00; Hemoglobin 15.5; Platelets 195; Potassium 4.6; Sodium 137  Recent Lipid Panel    Component Value Date/Time   CHOL 193 08/20/2020 1005   TRIG 96 08/20/2020 1005   HDL 44 08/20/2020 1005   CHOLHDL 4.4 08/20/2020 1005   VLDL 19 08/20/2020 1005   LDLCALC 130 (H) 08/20/2020 1005    Physical Exam:    VS:  BP 126/80   Pulse 94   Ht 6\' 1"  (1.854 m)   Wt 225 lb 6.4 oz (102.2 kg)   SpO2 98%   BMI 29.74 kg/m     Wt Readings from Last 3 Encounters:  12/24/20 225 lb 6.4 oz (102.2 kg)  12/11/20 226 lb 6 oz (102.7 kg)  11/28/20 230 lb (104.3 kg)     GEN:  Well nourished, well developed in no acute distress HEENT: Normal NECK: No JVD; No carotid bruits LYMPHATICS: No lymphadenopathy CARDIAC: RRR, no murmurs, rubs, gallops RESPIRATORY:  Clear to auscultation without rales, wheezing or rhonchi  ABDOMEN: Soft, non-tender, non-distended MUSCULOSKELETAL:  No edema; No deformity  SKIN:  Warm and dry NEUROLOGIC:  Alert and oriented x 3 PSYCHIATRIC:  Normal affect   ASSESSMENT:    1. Typical atrial flutter ([place])   2. Angina pectoris ([place])    PLAN:    In order of problems listed above:  1. Atrial flutter Post atrial flutter ablation on November 11, 2020.  Patient has not had a recurrence of his arrhythmia.  No atrial fibrillation has been detected after the ablation. Patient continues to experience symptoms with eating and symptoms of "poor blood flow" that he is concerned may represent an abnormal rhythm.  Today  he presents for a loop recorder implantation as part of an ongoing surveillance for atrial fibrillation.  We will plan to discontinue his anticoagulant on Feb 08, 2021 which will be 3 months after the ablation procedure.  2.  Nonspecific GI symptoms Patient has a long history of GI troubles including a gallbladder stone and obstruction requiring cholecystectomy.  Many of his symptoms he relates to certain food triggers.  I do not suspect a cardiac cause but we will evaluate with loop recorder.  Medication Adjustments/Labs and Tests Ordered: Current medicines are reviewed at length with the patient today.  Concerns regarding medicines are outlined above.  Orders Placed This Encounter  Procedures  . EKG 12-Lead   No orders of the defined types were placed in this encounter.    Signed, Adam Mage, MD, [facility], [facility] 12/24/2020 8:21 AM    Electrophysiology [facility]    ------------------------------------------------------------------------------------------------------  SURGEON:  Adam Mage, MD    PREPROCEDURE DIAGNOSIS:  Atrial flutter    POSTPROCEDURE DIAGNOSIS:  Atrial flutter     PROCEDURES:   1. Implantable loop recorder implantation    INTRODUCTION:  MAYUR DUMAN is a 50 y.o. male with a history of palpitations and atrial flutter who presents today for implantable loop implantation as part of a strategy  for ongoing atrial fibrillation surveillance.    DESCRIPTION OF PROCEDURE:  Informed written consent was obtained.  The patient required no sedation for the procedure today.  Mapping over the patient's chest was performed to identify the area where electrograms were most prominent for ILR recording.  This area was found to be the left parasternal region over the 3rd-4th intercostal space. The patients left chest was therefore prepped and draped in the usual sterile fashion. The skin overlying the left parasternal region was infiltrated with lidocaine for local analgesia.  A 0.5-cm incision was made over the left parasternal region over the 3rd intercostal space.  A subcutaneous ILR pocket was fashioned using a combination of sharp and blunt dissection.  A Medtronic Reveal Linq model [id number]) implantable loop recorder was then placed into the pocket  R waves were very prominent and measured >0.68mV.  Steri- Strips and a sterile dressing were then applied.  There were no early apparent complications.     CONCLUSIONS:   1. Successful implantation of a Medtronic Reveal LINQ implantable loop recorder for palpitations and recurrent symptoms of atrial fibrillation  2. No early apparent complications.   Adam Mage, MD 12/24/2020 8:21 AM

## 2020-12-24 NOTE — Patient Instructions (Addendum)
Medication Instructions:  Your physician has recommended you make the following change in your medication:   1.  ON Feb 08, 2021 you may STOP your ELIQUIS  Labwork: None ordered.  Testing/Procedures: None ordered.  Follow-Up:  Your physician wants you to follow-up in: 9 months with Dr. Quentin Ore.  You will receive a reminder letter in the mail two months in advance. If you don't receive a letter, please call our office to schedule the follow-up appointment.    Implantable Loop Recorder Placement, Care After This sheet gives you information about how to care for yourself after your procedure. Your health care provider may also give you more specific instructions. If you have problems or questions, contact your health care provider. What can I expect after the procedure? After the procedure, it is common to have:  Soreness or discomfort near the incision.  Some swelling or bruising near the incision.  Follow these instructions at home: Incision care  1.  Leave your outer dressing on for 72 hours.  After 72 hours you can remove your outer dressing and shower. 2. Leave adhesive strips in place. These skin closures may need to stay in place for 1-2 weeks. If adhesive strip edges start to loosen and curl up, you may trim the loose edges.  You may remove the strips if they have not fallen off after 2 weeks. 3. Check your incision area every day for signs of infection. Check for: a. Redness, swelling, or pain. b. Fluid or blood. c. Warmth. d. Pus or a bad smell. 4. Do not take baths, swim, or use a hot tub until your incision is completely healed. 5. If your wound site starts to bleed apply pressure.      If you have any questions/concerns please call the device clinic at [phone].  Activity  Return to your normal activities.  General instructions  Follow instructions from your health care provider about how to manage your implantable loop recorder and transmit the  information. Learn how to activate a recording if this is necessary for your type of device.  Do not go through a metal detection gate, and do not let someone hold a metal detector over your chest. Show your ID card.  Do not have an MRI unless you check with your health care provider first.  Take over-the-counter and prescription medicines only as told by your health care provider.  Keep all follow-up visits as told by your health care provider. This is important. Contact a health care provider if:  You have redness, swelling, or pain around your incision.  You have a fever.  You have pain that is not relieved by your pain medicine.  You have triggered your device because of fainting (syncope) or because of a heartbeat that feels like it is racing, slow, fluttering, or skipping (palpitations). Get help right away if you have:  Chest pain.  Difficulty breathing. Summary  After the procedure, it is common to have soreness or discomfort near the incision.  Change your dressing as told by your health care provider.  Follow instructions from your health care provider about how to manage your implantable loop recorder and transmit the information.  Keep all follow-up visits as told by your health care provider. This is important. This information is not intended to replace advice given to you by your health care provider. Make sure you discuss any questions you have with your health care provider. Document Released: 08/12/2015 Document Revised: 10/16/2017 Document Reviewed: 10/16/2017 Elsevier Patient Education  [place].

## 2020-12-24 NOTE — Patient Instructions (Addendum)
Access Code: [email or web address] URL: [email or web address] Date: 12/26/2020 Prepared by: Roxana Hires  Exercises Standing Wrist Flexion Stretch - 2-3 x daily - 7 x weekly - 3 reps - 45-60s hold Seated Wrist Radial Deviation with Anchored Resistance - 1 x daily - 7 x weekly - 2 sets - 10 reps - 3s hold Seated Wrist Extension with Dumbbell - 1 x daily - 7 x weekly - 2 sets - 10 reps - 3s hold Forearm Pronation and Supination with Hammer - 1 x daily - 7 x weekly - 2 sets - 10 reps - 3s hold Seated Gripping Towel - 1 x daily - 7 x weekly - 2 sets - 10 reps - 5s hold  Patient Education Tennis Elbow Golfer's Elbow Ice Massage

## 2020-12-26 ENCOUNTER — Other Ambulatory Visit: Payer: Self-pay

## 2020-12-26 ENCOUNTER — Ambulatory Visit: Payer: Medicare Other | Attending: Orthopaedic Surgery

## 2020-12-26 DIAGNOSIS — M25522 Pain in left elbow: Secondary | ICD-10-CM | POA: Insufficient documentation

## 2020-12-26 NOTE — Therapy (Signed)
[facility] [address] [place], [place], [address] Phone: [phone]   Fax:  [phone]  Physical Therapy Treatment/Discharge  Patient Details  Name: Adam Holloway MRN: [id number] Date of Birth: 04-10-1971 Referring Provider (PT): Joni Fears   Encounter Date: 12/26/2020   PT End of Session - 12/26/20 0938    Visit Number 5    Number of Visits 17    Date for PT Re-Evaluation 01/21/21    Authorization Type eval: 11/26/20    PT Start Time 0933    PT Stop Time 1015    PT Time Calculation (min) 42 min    Activity Tolerance Patient tolerated treatment well    Behavior During Therapy [facility] for tasks assessed/performed           Past Medical History:  Diagnosis Date  . Arthritis    back, left wrist, left knee  . Complication of anesthesia    bp dropped during gallbladder surgery in 2008  . Depression   . Family history of adverse reaction to anesthesia    mom stopped breathing during colonscopy  . GERD (gastroesophageal reflux disease)    h/o  . HA (headache)    h/o migrianes  . Herniated disc, cervical   . History of kidney stones    h/o stones  . History of leukocytosis   . Lumbago   . Pain in soft tissues of limb   . Pancreatitis   . Peripheral neuropathy 04/12/2019  . PTSD (post-traumatic stress disorder)   . Scoliosis     Past Surgical History:  Procedure Laterality Date  . A-FLUTTER ABLATION N/A 11/11/2020   Procedure: A-FLUTTER ABLATION;  Surgeon: Vickie Epley, MD;  Location: [facility];  Service: Cardiovascular;  Laterality: N/A;  . CHOLECYSTECTOMY  2008  . COLONOSCOPY  2010  . COLONOSCOPY WITH PROPOFOL N/A 06/09/2016   Procedure: COLONOSCOPY WITH PROPOFOL;  Surgeon: Christene Lye, MD;  Location: [facility];  Service: Endoscopy;  Laterality: N/A;  . FISSURECTOMY N/A 06/29/2016   Procedure: FISSURECTOMY;  Surgeon: Christene Lye, MD;  Location: [facility];  Service:  General;  Laterality: N/A;  . HEMORRHOID SURGERY N/A 06/29/2016   Procedure: HEMORRHOIDECTOMY;  Surgeon: Christene Lye, MD;  Location: [facility];  Service: General;  Laterality: N/A;  . PAROTIDECTOMY Left 1997  . SALIVARY GLAND SURGERY  1997  . SPHINCTEROTOMY N/A 06/29/2016   Procedure: SPHINCTEROTOMY;  Surgeon: Christene Lye, MD;  Location: [facility];  Service: General;  Laterality: N/A;  . WRIST ARTHROSCOPY Left 01/26/2017   Procedure: ARTHROSCOPY LEFT WRIST DEBRIDEMENT;  Surgeon: Daryll Brod, MD;  Location: [facility];  Service: Orthopedics;  Laterality: Left;  . WRIST SURGERY  2019    There were no vitals filed for this visit.   Subjective Assessment - 12/26/20 0937    Subjective Reports doing well upon arrival today. Denies pain currently. States that he has not had any pain in his lateral elbow since his last therapy session. Reports at least 95% improvement in symptoms since starting therapy. No specific questions or concerns.    Pertinent History Pt reports that 1 year ago February he started having really bad stiffness in his "whole left elbow" but more so on the lateral aspect. He had a steroid injection that gave him about 3 months of relief but eventually the pain returned. He had a repeat injection but unfortunately that one only gave him 2 weeks of relief. He has not  undergone any additional steroid injections since that time. Pt reports that he feels like the pain has been worsening. He tosses and turns a lot during his sleep. He notes that the pain worsens when holding his cell phone. He complains of occasional numbness in the palmar and dorsal aspects of digits 4-5. He saw orthopedics for his L elbow and they referred him for physical therapy. At the time of his initial consult they reported no evidence of ulnar nerve pathology. He had a heart ablation surgery on 11/11/20 which went well and pt remains on Eliquis through the end of the month. He used to  play a lot of baseball and softball but not currently. He has a history of herniated cervical disc years ago which radiated down the LUE at the time and still gives him occasional pain. He feels like his neck and L shoulder pain may be related to his L elbow issues. He had an MRI scan of his L elbow which showed mild tendinosis and small foci of partial thickness tearing of the common flexor and extensor tendons at their origins. He reports that his primary limitation related to his L elbow pain is lifting. Denies any issues with his RUE.    Limitations House hold activities    Diagnostic tests MRI (see history)    Patient Stated Goals Decrease pain so he can return to pain-free lifting of plates, groceries, etc.    Currently in Pain? No/denies              TREATMENT   Ther-ex Moist heat pack applied to L dorsal forearm over lateral mass during interval history and HEP review with pt in sitting and arm supported to improve tissue extensibility prior to stretches x 5 minutes, skinexamined before and after heatwithout any signs of irritation; L wrist extensor stretch with straight elbow to improve extensibility of wrist extensors 60s hold x 2; L wrist extension off edge of table5# dumbbell (DB) 2 x 20, pain-free; L wristradial deviation off edge of table 5# DB2 x 20, pain-free L grip strength: 78.5#, pain-free QuickDASH: 9.1% FOTO: 67; Wrist flexion: R/L 73/66 Updated HEP and pt provided education about how to perform exercises at home as well as indicates for when he would need to return for additional therapy.    Pt educated throughout session about proper posture and technique with exercises. Improved exercise technique, movement at target joints, use of target muscles after min to mod verbal, visual, tactile cues.   Patientreporting around 95% improvement in left lateral elbow pain since starting therapy.He has been consistent with his HEP which has helped  considerably. Updated outcome measures and goals with patient today. His FOTO improved from 31 at initial evaluation to 4 today and his QuickDASH has decreased from 95.5% to 9.1%. He has not hand any pain over the course of the last week with the exception of one episode where he twisted his arm in an odd direction while picking up a trash back. Pain-free grip has improved from 14.8# initially to 78.5# which is symmetrical to his R arm. His pain-free L wrist flexion AROM has also improved from 35 degrees at initial evaluation to 66 degrees today. Pt provided HEP progression and furnished with additional therabands. He will be discharged on this date having met all of his goals. Pt encouraged to return for additional therapy if he has regression of his symptoms.  PT Education - 12/26/20 0948    Education Details Discharge    Person(s) Educated Patient    Methods Explanation    Comprehension Verbalized understanding            PT Short Term Goals - 12/26/20 0948      PT SHORT TERM GOAL #1   Title Pt will be independent with HEP in order to improve strength and decrease elbow pain in order to improve pain-free function at home.    Time 4    Period Weeks    Status Achieved    Target Date --      PT SHORT TERM GOAL #2   Title Pt will decrease quick DASH score by at least 8% in order to demonstrate clinically significant reduction in disability.    Baseline 11/26/20: 95.5%; 12/12/20: 40.9%    Time 4    Period Weeks    Status Achieved    Target Date --             PT Long Term Goals - 12/26/20 0949      PT LONG TERM GOAL #1   Title Pt will decrease worst elbow pain as reported on NPRS by at least 3 points in order to demonstrate clinically significant reduction in elbow pain.    Baseline 11/27/20: worst: 10/10; 12/12/20: 2/10; 12/26/20: 0/10    Time 8    Period Weeks    Status Achieved      PT LONG TERM GOAL #2   Title Pt will decrease  quick DASH score to below 25% in order to demonstrate clinically significant reduction in disability and ability to complete all ADLs without increase in his elbow pain.    Baseline 11/26/20: 95.5%; 12/12/20: 40.9%; 12/26/20: 9.1%    Time 8    Period Weeks    Status Achieved      PT LONG TERM GOAL #3   Title Pt will improve FOTO to at least 52 in order to demonstrate significant improvement in function at home.    Baseline 11/26/20: 31; 12/12/20: 64; 12/26/20: 67    Time 8    Period Weeks    Status Achieved      PT LONG TERM GOAL #4   Title Pt will improve L wrist flexion to within 10 degrees of R wrist in order to demonstrate improved extensibility of L wrist extensors to reduce risk of recurrent irritation of lateral epicondylitis    Baseline 11/26/20: R/L 73/35; 12/26/20: R/L 73/66    Time 8    Period Weeks    Status Achieved      PT LONG TERM GOAL #5   Title Pt will improve pain-free gripping in LUE to within 15% (78.2#)  of RUE in order to be able to lift plates/groceries and hold cell phone without pain    Baseline 11/26/20: pain-free 14.8#; 12/12/20: 64.4#, pain-free; 12/26/20: 78.5#, painfree    Time 8    Period Weeks    Status Achieved                 Plan - 12/26/20 0175    Clinical Impression Statement Patient reporting around 95% improvement in left lateral elbow pain since starting therapy. He has been consistent with his HEP which has helped considerably. Updated outcome measures and goals with patient today. His FOTO improved from 31 at initial evaluation to 10 today and his QuickDASH has decreased from 95.5% to 9.1%. He has not hand any pain over the course of the  last week with the exception of one episode where he twisted his arm in an odd direction while picking up a trash back. Pain-free grip has improved from 14.8# initially to 78.5# which is symmetrical to his R arm. His pain-free L wrist flexion AROM has also improved from 35 degrees at initial evaluation to 66  degrees today. Pt provided HEP progression and furnished with additional therabands. He will be discharged on this date having met all of his goals. Pt encouraged to return for additional therapy if he has regression of his symptoms.    Personal Factors and Comorbidities Age;Comorbidity 3+;Past/Current Experience;Time since onset of injury/illness/exacerbation    Comorbidities OA, depression, herniated cervical disck, PTSD    Examination-Activity Limitations Lift    Examination-Participation Restrictions Cleaning;Community Activity;Laundry;Meal Prep;Shop    Stability/Clinical Decision Making Unstable/Unpredictable    Rehab Potential Good    PT Frequency 2x / week    PT Duration 8 weeks    PT Treatment/Interventions ADLs/Self Care Home Management;Aquatic Therapy;Biofeedback;Canalith Repostioning;Cryotherapy;Electrical Stimulation;Iontophoresis 54m/ml Dexamethasone;Moist Heat;Traction;Ultrasound;Gait training;Stair training;Functional mobility training;Therapeutic activities;Therapeutic exercise;Balance training;Neuromuscular re-education;Cognitive remediation;Patient/family education;Manual techniques;Passive range of motion;Dry needling;Splinting;Taping;Vestibular;Spinal Manipulations;Joint Manipulations    PT Next Visit Plan Discharge    PT Home Exercise Plan Medbridge Access Code: [email or web address]   Consulted and Agree with Plan of Care Patient           Patient will benefit from skilled therapeutic intervention in order to improve the following deficits and impairments:  Pain,Impaired flexibility,Decreased strength  Visit Diagnosis: Pain in left elbow     Problem List Patient Active Problem List   Diagnosis Date Noted  . Palpitations 12/17/2020  . A[place]ical atrial flutter ([place] 10/29/2020  . Left tennis elbow 07/10/2020  . Weight loss 06/10/2020  . Vitamin D deficiency 04/11/2020  . Gastroesophageal reflux disease without esophagitis 04/11/2020  . Anxiety 04/11/2020  . Pain in left  wrist 12/13/2019  . Pain in left elbow 12/13/2019  . Neuropathy 04/12/2019  . Low vitamin B12 level 02/20/2019  . Leukocytosis 02/12/2019  . Right-sided chest pain 02/12/2019  . Neck pain 12/07/2018  . HA (headache)   . Pain in soft tissues of limb   . Lumbago   . Gynecomastia, male 07/12/2013   JPhillips GroutPT, DPT, GCS  Arlenne Kimbley 12/26/2020, 10:46 AM  [place] [facility] [place] [address]Phone: [phone]  Fax:  [phone] Name: Adam OBENCHAINMRN: [id number] of Birth: 2Sep 04, 1972

## 2020-12-29 ENCOUNTER — Encounter: Payer: Self-pay | Admitting: Hematology and Oncology

## 2020-12-31 ENCOUNTER — Ambulatory Visit: Payer: Medicare Other

## 2021-01-01 ENCOUNTER — Ambulatory Visit: Payer: Medicare Other | Admitting: Cardiology

## 2021-01-02 ENCOUNTER — Other Ambulatory Visit: Payer: Self-pay | Admitting: Cardiology

## 2021-01-02 ENCOUNTER — Ambulatory Visit: Payer: Medicare Other

## 2021-01-02 ENCOUNTER — Other Ambulatory Visit: Payer: Self-pay | Admitting: Internal Medicine

## 2021-01-02 DIAGNOSIS — K219 Gastro-esophageal reflux disease without esophagitis: Secondary | ICD-10-CM

## 2021-01-07 ENCOUNTER — Other Ambulatory Visit: Payer: Self-pay

## 2021-01-07 DIAGNOSIS — D72829 Elevated white blood cell count, unspecified: Secondary | ICD-10-CM

## 2021-01-08 ENCOUNTER — Inpatient Hospital Stay (HOSPITAL_BASED_OUTPATIENT_CLINIC_OR_DEPARTMENT_OTHER): Payer: Medicare Other | Admitting: Oncology

## 2021-01-08 ENCOUNTER — Encounter: Payer: Self-pay | Admitting: Oncology

## 2021-01-08 ENCOUNTER — Inpatient Hospital Stay: Payer: Medicare Other | Attending: Oncology

## 2021-01-08 ENCOUNTER — Other Ambulatory Visit: Payer: Self-pay

## 2021-01-08 VITALS — BP 127/89 | HR 75 | Temp 96.6°F | Resp 20 | Wt 235.3 lb

## 2021-01-08 DIAGNOSIS — I209 Angina pectoris, unspecified: Secondary | ICD-10-CM | POA: Diagnosis not present

## 2021-01-08 DIAGNOSIS — F17210 Nicotine dependence, cigarettes, uncomplicated: Secondary | ICD-10-CM | POA: Diagnosis not present

## 2021-01-08 DIAGNOSIS — D72829 Elevated white blood cell count, unspecified: Secondary | ICD-10-CM | POA: Diagnosis present

## 2021-01-08 DIAGNOSIS — R1012 Left upper quadrant pain: Secondary | ICD-10-CM

## 2021-01-08 LAB — COMPREHENSIVE METABOLIC PANEL
ALT: 19 U/L (ref 0–44)
AST: 15 U/L (ref 15–41)
Albumin: 4.5 g/dL (ref 3.5–5.0)
Alkaline Phosphatase: 66 U/L (ref 38–126)
Anion gap: 8 (ref 5–15)
BUN: 15 mg/dL (ref 6–20)
CO2: 27 mmol/L (ref 22–32)
Calcium: 9.3 mg/dL (ref 8.9–10.3)
Chloride: 102 mmol/L (ref 98–111)
Creatinine, Ser: 1.24 mg/dL (ref 0.61–1.24)
GFR, Estimated: >60 mL/min (ref >60)
Glucose, Bld: 86 mg/dL (ref 70–99)
Potassium: 4.3 mmol/L (ref 3.5–5.1)
Sodium: 137 mmol/L (ref 135–145)
Total Bilirubin: 0.2 mg/dL — ABNORMAL LOW (ref 0.3–1.2)
Total Protein: 7.3 g/dL (ref 6.5–8.1)

## 2021-01-08 LAB — CBC WITH DIFFERENTIAL/PLATELET
Abs Immature Granulocytes: 0.05 10*3/uL (ref 0.00–0.07)
Basophils Absolute: 0 10*3/uL (ref 0.0–0.1)
Basophils Relative: 0 %
Eosinophils Absolute: 0.2 10*3/uL (ref 0.0–0.5)
Eosinophils Relative: 1 %
HCT: 45.2 % (ref 39.0–52.0)
Hemoglobin: 15.2 g/dL (ref 13.0–17.0)
Immature Granulocytes: 0 %
Lymphocytes Relative: 27 %
Lymphs Abs: 3.1 10*3/uL (ref 0.7–4.0)
MCH: 30.2 pg (ref 26.0–34.0)
MCHC: 33.6 g/dL (ref 30.0–36.0)
MCV: 89.7 fL (ref 80.0–100.0)
Monocytes Absolute: 0.7 10*3/uL (ref 0.1–1.0)
Monocytes Relative: 6 %
Neutro Abs: 7.4 10*3/uL (ref 1.7–7.7)
Neutrophils Relative %: 66 %
Platelets: 168 10*3/uL (ref 150–400)
RBC: 5.04 MIL/uL (ref 4.22–5.81)
RDW: 14.4 % (ref 11.5–15.5)
WBC: 11.5 10*3/uL — ABNORMAL HIGH (ref 4.0–10.5)
nRBC: 0 % (ref 0.0–0.2)

## 2021-01-08 LAB — AMYLASE: Amylase: 44 U/L (ref 28–100)

## 2021-01-08 LAB — LIPASE, BLOOD: Lipase: 37 U/L (ref 11–51)

## 2021-01-08 NOTE — Progress Notes (Signed)
Pt states after he eats he hurts on his side and back around his abdomen Has been going on for about a week since he last took his tylenol.

## 2021-01-08 NOTE — Progress Notes (Signed)
[facility]  [address] [place], [place] [address] Phone: [phone]  Fax: [phone]   Clinic Day: 01/08/21  Referring physician: [name], MD  Chief Complaint: Adam Holloway is a 50 y.o. male with leukocytosis and weight loss who is seen for 1 month assessment.   HPI:  He was last seen in clinic on 11/05/20. At this time he reports symptoms of palpitations, joint pain, neuropathy, and stress. Endorsed bring "tired" due to his blood thinner. His appetite is "okay." His atrial flutter is triggered by eating, so he has started eating a bit less. His goal weight iis between 180-200. He denies any bruising or bleeding on Eliquis.His migraines, sinus drainage, and throat discomfort have resolved. His vision has improved. His neuropathy goes away when his heart is not fluttering. He is under stress because he does not get along with his sister. He has tendonitis in his left elbow.  He was found to have mildly elevated white blood cell count.  Work-up included peripheral smear which revealed absolute leukocytosis, with unremarkable differential and morphology. There were no circulating blasts identified, and no significant left shift in maturation. RBC parameters and morphology were normal. Platelet count and morphology were normal.  He has a history of atrial flutter and is status post ablation.  He sees Dr. Garen Lah in cardiology and Dr. Quentin Ore and electrophysiologist.  He is stable on Eliquis 5 mg twice daily.  He recently had a loop recorder placed for ongoing heart monitoring.  He anticipates his Eliquis to be discontinued in May sometime.  During the interim, he continues to feel tired.  He feels this is directly related to the Eliquis.  He is scheduled to stop his Eliquis next month.  His weight is up 10 pounds since his last visit.  His appetite is good.  Endorses an episode of left upper quadrant abdominal pain that radiated to his back  that was severe.  He called clinic asking for an appointment to recheck his labs.  He denies any constipation or diarrhea, fever or recent infection.  States he has a history of chronic pancreatitis.  He does not have a gallbladder.  Feels symptoms are exacerbated when he eats.  Past Medical History:  Diagnosis Date  . Arthritis    back, left wrist, left knee  . Complication of anesthesia    bp dropped during gallbladder surgery in 2008  . Depression   . Family history of adverse reaction to anesthesia    mom stopped breathing during colonscopy  . GERD (gastroesophageal reflux disease)    h/o  . HA (headache)    h/o migrianes  . Herniated disc, cervical   . History of kidney stones    h/o stones  . History of leukocytosis   . Lumbago   . Pain in soft tissues of limb   . Pancreatitis   . Peripheral neuropathy 04/12/2019  . PTSD (post-traumatic stress disorder)   . Scoliosis     Past Surgical History:  Procedure Laterality Date  . A-FLUTTER ABLATION N/A 11/11/2020   Procedure: A-FLUTTER ABLATION;  Surgeon: Vickie Epley, MD;  Location: [place] [facility];  Service: Cardiovascular;  Laterality: N/A;  . CHOLECYSTECTOMY  2008  . COLONOSCOPY  2010  . COLONOSCOPY WITH PROPOFOL N/A 06/09/2016   Procedure: COLONOSCOPY WITH PROPOFOL;  Surgeon: Christene Lye, MD;  Location: [facility];  Service: Endoscopy;  Laterality: N/A;  . FISSURECTOMY N/A 06/29/2016   Procedure: FISSURECTOMY;  Surgeon: Daryel Gerald  Wynona LunaG Sankar, MD;  Location: [facility];  Service: General;  Laterality: N/A;  . HEMORRHOID SURGERY N/A 06/29/2016   Procedure: HEMORRHOIDECTOMY;  Surgeon: Kieth BrightlySeeplaputhur G Sankar, MD;  Location: [facility];  Service: General;  Laterality: N/A;  . PAROTIDECTOMY Left 1997  . SALIVARY GLAND SURGERY  1997  . SPHINCTEROTOMY N/A 06/29/2016   Procedure: SPHINCTEROTOMY;  Surgeon: Kieth BrightlySeeplaputhur G Sankar, MD;  Location: [facility];  Service: General;  Laterality: N/A;  . WRIST ARTHROSCOPY Left  01/26/2017   Procedure: ARTHROSCOPY LEFT WRIST DEBRIDEMENT;  Surgeon: Cindee SaltKuzma, Gary, MD;  Location: [facility];  Service: Orthopedics;  Laterality: Left;  . WRIST SURGERY  2019    Family History  Problem Relation Age of Onset  . Cancer Father   . Heart Problems Father   . Lung cancer Father   . Colon cancer Mother   . Liver cancer Brother     Social History:  reports that he quit smoking about 2 months ago. His smoking use included cigarettes. He has a 16.00 pack-year smoking history. He has quit using smokeless tobacco. He reports that he does not drink alcohol and does not use drugs. He does not drink alcohol, and stopped in 2006. He smoked a pack a day for 30 years until 01/16/2019. He is currently smoking 1-2 cigarettes daily. He denies any known exposure to radiation or toxins. 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 He is on disability for his back issues and PTSD. The patient is alone today.  Allergies:  Allergies  Allergen Reactions  . Augmentin [Amoxicillin-Pot Clavulanate] Anaphylaxis    Liver failure   . Atorvastatin     Muscle and joint pain  . Nsaids Other (See Comments)    Chest pain  . Tolmetin Other (See Comments)    Chest pain  . Meloxicam Other (See Comments)    Chest pain    Current Medications: Current Outpatient Medications  Medication Sig Dispense Refill  . acetaminophen (TYLENOL) 325 MG tablet Take 325 mg by mouth every 6 (six) hours as needed for moderate pain.    Marland Kitchen. apixaban (ELIQUIS) 5 MG TABS tablet Take 1 tablet (5 mg total) by mouth 2 (two) times daily. 60 tablet 6  . atorvastatin (LIPITOR) 20 MG tablet Take 1 tablet (20 mg total) by mouth daily. 30 tablet 5  . calcium carbonate (TUMS - DOSED IN MG ELEMENTAL CALCIUM) 500 MG chewable tablet Chew 500 mg by mouth daily as needed for indigestion or heartburn.    . diazepam (VALIUM) 5 MG tablet TAKE 1 TABLET BY MOUTH DAILY 30 tablet 0   . pantoprazole (PROTONIX) 40 MG tablet TAKE 1 TABLET BY MOUTH EVERY DAY 45 tablet 0  . Vitamin D, Ergocalciferol, (DRISDOL) 1.25 MG (50000 UNIT) CAPS capsule Take 50,000 Units by mouth every Wednesday.     No current facility-administered medications for this visit.    Review of Systems  Constitutional: Positive for malaise/fatigue. Negative for chills, fever and weight loss.  HENT: Negative for congestion, ear pain and tinnitus.   Eyes: Negative.  Negative for blurred vision and double vision.  Respiratory: Negative.  Negative for cough, sputum production and shortness of breath.   Cardiovascular: Negative.  Negative for chest pain, palpitations and leg swelling.  Gastrointestinal: Positive for abdominal pain. Negative for constipation, diarrhea, nausea and vomiting.  Genitourinary: Negative for dysuria, frequency and urgency.  Musculoskeletal: Negative for back pain and falls.  Skin: Negative.  Negative for  rash.  Neurological: Negative.  Negative for weakness and headaches.  Endo/Heme/Allergies: Negative.  Does not bruise/bleed easily.  Psychiatric/Behavioral: Negative for depression. The patient is nervous/anxious. The patient does not have insomnia.     Performance status (ECOG):  1  Vital Signs: There were no vitals taken for this visit.  Physical Exam Constitutional:      Appearance: He is well-developed.  HENT:     Head: Normocephalic and atraumatic.  Eyes:     Pupils: Pupils are equal, round, and reactive to light.  Cardiovascular:     Rate and Rhythm: Normal rate and regular rhythm.     Heart sounds: No murmur heard.   Pulmonary:     Effort: Pulmonary effort is normal.     Breath sounds: Normal breath sounds. No wheezing.  Abdominal:     General: Bowel sounds are normal. There is no distension.     Palpations: Abdomen is soft. There is no mass.     Tenderness: There is no abdominal tenderness.  Musculoskeletal:        General: Normal range of motion.      Cervical back: Normal range of motion.  Skin:    General: Skin is warm and dry.  Neurological:     Mental Status: He is alert and oriented to person, place, and time.  Psychiatric:        Behavior: Behavior normal.    No visits with results within 3 Day(s) from this visit.  Latest known visit with results is:  Hospital Outpatient Visit on 11/08/2020  Component Date Value Ref Range Status  . Sodium 11/08/2020 137  135 - 145 mmol/L Final  . Potassium 11/08/2020 4.6  3.5 - 5.1 mmol/L Final  . Chloride 11/08/2020 106  98 - 111 mmol/L Final  . CO2 11/08/2020 23  22 - 32 mmol/L Final  . Glucose, Bld 11/08/2020 99  70 - 99 mg/dL Final   Glucose reference range applies only to samples taken after fasting for at least 8 hours.  . BUN 11/08/2020 15  6 - 20 mg/dL Final  . Creatinine, Ser 11/08/2020 1.00  0.61 - 1.24 mg/dL Final  . Calcium 11/08/2020 9.8  8.9 - 10.3 mg/dL Final  . Total Protein 11/08/2020 7.6  6.5 - 8.1 g/dL Final  . Albumin 11/08/2020 4.6  3.5 - 5.0 g/dL Final  . AST 11/08/2020 20  15 - 41 U/L Final  . ALT 11/08/2020 26  0 - 44 U/L Final  . Alkaline Phosphatase 11/08/2020 69  38 - 126 U/L Final  . Total Bilirubin 11/08/2020 0.5  0.3 - 1.2 mg/dL Final  . GFR, Estimated 11/08/2020 >60  >60 mL/min Final   Comment: (NOTE) Calculated using the CKD-EPI Creatinine Equation (2021)   . Anion gap 11/08/2020 8  5 - 15 Final   Performed at [facility], [address] [place], [place] [address]  . WBC 11/08/2020 10.0  4.0 - 10.5 K/uL Final  . RBC 11/08/2020 5.06  4.22 - 5.81 MIL/uL Final  . Hemoglobin 11/08/2020 15.5  13.0 - 17.0 g/dL Final  . HCT 11/08/2020 46.5  39.0 - 52.0 % Final  . MCV 11/08/2020 91.9  80.0 - 100.0 fL Final  . MCH 11/08/2020 30.6  26.0 - 34.0 pg Final  . MCHC 11/08/2020 33.3  30.0 - 36.0 g/dL Final  . RDW 11/08/2020 14.2  11.5 - 15.5 % Final  . Platelets 11/08/2020 195  150 - 400 K/uL Final  . nRBC 11/08/2020 0.0  0.0 - 0.2 % Final   Performed at  [facility], [address] [place], [place] [address]    Assessment:  Adam Holloway is a 50 y.o. male with mild leukocytosis felt reactive.  WBC has ranged between 11,300 - 16,700 in the past 2 months.  Work-up on 01/19/2019 revealed the following normal labs:  hepatitis, hepatitis B core total antibody, hepatitis C antibody, HIV testing, Burgdorferi antibodies, ANA, ACE, rheumatoid factor, RPR, sed rate, amylase, and lipase.  ALT was 48 (0-44).  B12 was 341 (low normal).   Work-up on 02/08/2019 revealed a hematocrit of 46.3, hemoglobin 15.5, MCV 92.6, platelets 195,000, WBC 10,700 with an ANC of 7100.  Differential was unremarkable.  CRP was < 0.8 (normal).  BCR-ABL was negative.    Peripheral smear revealed persistent mild leukocytosis. There was mild non specific leukocytosis with mature neutrophils and lymphocytes. There was occasional reactive appearing lymphocytes. There was no shift in immaturity. RBC morphology was unremarkable.  Platelets appeared mildly increased in size.  Overall the morphologic findings were not specific and are likely reactive to another process. A hematologic neoplastic process was not favored.   He has a 30 pack year smoking history.  Chest CT on 02/15/2019 revealed no acute cardiopulmonary disease.  There were scattered coronary artery calcifications, mild bilateral gynecomastia, and aortic atherosclerosis.  PET scan on 06/06/2020 revealed no significant hypermetabolic lesion is identified to suggest hypermetabolic malignancy in the setting of the patient's unintentional weight loss.  Symptomatically, he feels "tired". Appetite is "ok."  He has a 10 pound weight gain.  Had heart ablation for atrial flutter.  He also had a loop recorder placed for monitoring.  He is currently still on Eliquis with anticipation to discontinue next month.  Had abdominal pain approximately 1 week ago after eating in his left upper quadrant that radiated to his back.   This happens intermittently.  Plan: 1.   Labs today: CBC with diff. 2.   Mild leukocytosis  -WBC is 11.5 with an unremarkable differential.  -He has had reactive leukocytosis.    -PET scan on 06/06/2020 revealed no evidence of metastatic disease.  -Reassurance provided.  -Given history of pancreatitis can add a lipase, amylase and CMP.  Unlikely, this would be elevated given he is not actively having abdominal pain.    -Additional lab work showed a normal amylase and lipase level.  Liver and kidney function is normal. 3.   Smoking  Patient has decreased his smoking to 2 cigarettes a day.    Encourage ongoing smoking cessation.  Disposition: Add labs today.   We will call with results. RTC as needed. No follow-up scheduled.   I discussed the assessment and treatment plan with the patient.  The patient was provided an opportunity to ask questions and all were answered.  The patient agreed with the plan and demonstrated an understanding of the instructions.  The patient was advised to call back or seek an in person evaluation if the symptoms worsen or if the condition fails to improve as anticipated.  Greater than 50% was spent in counseling and coordination of care with this patient including but not limited to discussion of the relevant topics above (See A&P) including, but not limited to diagnosis and management of acute and chronic medical conditions.   Faythe Casa, NP 01/08/2021 3:59 PM

## 2021-01-12 HISTORY — PX: LOOP RECORDER IMPLANT: SHX5954

## 2021-01-21 ENCOUNTER — Other Ambulatory Visit: Payer: Self-pay

## 2021-01-21 ENCOUNTER — Encounter: Payer: Self-pay | Admitting: Neurology

## 2021-01-21 ENCOUNTER — Ambulatory Visit (INDEPENDENT_AMBULATORY_CARE_PROVIDER_SITE_OTHER): Payer: Medicare Other | Admitting: Neurology

## 2021-01-21 VITALS — BP 119/78 | HR 80 | Ht 73.0 in | Wt 230.2 lb

## 2021-01-21 DIAGNOSIS — I209 Angina pectoris, unspecified: Secondary | ICD-10-CM | POA: Diagnosis not present

## 2021-01-21 DIAGNOSIS — R140 Abdominal distension (gaseous): Secondary | ICD-10-CM

## 2021-01-21 NOTE — Progress Notes (Signed)
Reason for visit: Low back pain, arm pain  Adam Holloway is an 50 y.o. male  History of present illness:  Adam Holloway is a 50 year old right-handed white male with a history of multiple somatic complaints in the past.  He has had issues previously with low back pain and left leg pain, he has had some left arm discomfort and headaches.  These issues recently have not been bothersome for him.  The patient was noted to have episodes of shortness of breath and fatigue, he was set up for a cardiac monitor study that showed episodes of atrial flutter.  He underwent a cardiac ablation procedure on 11 November 2020.  For the first month afterwards, he felt quite well, now he is having recurrence of abdominal bloating and shortness of breath after eating meals.  He has a loop recorder in place and apparently no cardiac abnormality has been noted with these events.  At times, he may have episodes of his feet feeling cold and numb, but this will come and go.  His left arm discomfort was improved following physical therapy.  The patient does have prediabetes, he is trying to maintain a low carbohydrate diet.  Past Medical History:  Diagnosis Date  . Arthritis    back, left wrist, left knee  . Complication of anesthesia    bp dropped during gallbladder surgery in 2008  . Depression   . Family history of adverse reaction to anesthesia    mom stopped breathing during colonscopy  . GERD (gastroesophageal reflux disease)    h/o  . HA (headache)    h/o migrianes  . Herniated disc, cervical   . History of kidney stones    h/o stones  . History of leukocytosis   . Lumbago   . Pain in soft tissues of limb   . Pancreatitis   . Peripheral neuropathy 04/12/2019  . PTSD (post-traumatic stress disorder)   . Scoliosis     Past Surgical History:  Procedure Laterality Date  . A-FLUTTER ABLATION N/A 11/11/2020   Procedure: A-FLUTTER ABLATION;  Surgeon: Vickie Epley, MD;  Location: [place]  [facility];  Service: Cardiovascular;  Laterality: N/A;  . CHOLECYSTECTOMY  2008  . COLONOSCOPY  2010  . COLONOSCOPY WITH PROPOFOL N/A 06/09/2016   Procedure: COLONOSCOPY WITH PROPOFOL;  Surgeon: Christene Lye, MD;  Location: [facility];  Service: Endoscopy;  Laterality: N/A;  . FISSURECTOMY N/A 06/29/2016   Procedure: FISSURECTOMY;  Surgeon: Christene Lye, MD;  Location: [facility];  Service: General;  Laterality: N/A;  . HEMORRHOID SURGERY N/A 06/29/2016   Procedure: HEMORRHOIDECTOMY;  Surgeon: Christene Lye, MD;  Location: [facility];  Service: General;  Laterality: N/A;  . PAROTIDECTOMY Left 1997  . SALIVARY GLAND SURGERY  1997  . SPHINCTEROTOMY N/A 06/29/2016   Procedure: SPHINCTEROTOMY;  Surgeon: Christene Lye, MD;  Location: [facility];  Service: General;  Laterality: N/A;  . WRIST ARTHROSCOPY Left 01/26/2017   Procedure: ARTHROSCOPY LEFT WRIST DEBRIDEMENT;  Surgeon: Daryll Brod, MD;  Location: [place];  Service: Orthopedics;  Laterality: Left;  . WRIST SURGERY  2019    Family History  Problem Relation Age of Onset  . Cancer Father   . Heart Problems Father   . Lung cancer Father   . Colon cancer Mother   . Liver cancer Brother     Social history:  reports that he quit smoking about 2 months ago. His smoking use included cigarettes. He has a  16.00 pack-year smoking history. He has quit using smokeless tobacco. He reports that he does not drink alcohol and does not use drugs.    Allergies  Allergen Reactions  . Augmentin [Amoxicillin-Pot Clavulanate] Anaphylaxis    Liver failure   . Atorvastatin     Muscle and joint pain  . Nsaids Other (See Comments)    Chest pain  . Tolmetin Other (See Comments)    Chest pain  . Meloxicam Other (See Comments)    Chest pain    Medications:  Prior to Admission medications   Medication Sig Start Date End Date Taking? Authorizing Provider  apixaban (ELIQUIS) 5 MG TABS tablet Take 1 tablet (5  mg total) by mouth 2 (two) times daily. 12/24/20 02/08/21 Yes Vickie Epley, MD  pantoprazole (PROTONIX) 40 MG tablet TAKE 1 TABLET BY MOUTH EVERY DAY 12/19/20  Yes Vickie Epley, MD  Vitamin D, Ergocalciferol, (DRISDOL) 1.25 MG (50000 UNIT) CAPS capsule Take 50,000 Units by mouth every Wednesday. 08/16/20  Yes [provider]  acetaminophen (TYLENOL) 325 MG tablet Take 325 mg by mouth every 6 (six) hours as needed for moderate pain.    [provider]  atorvastatin (LIPITOR) 20 MG tablet Take 1 tablet (20 mg total) by mouth daily. Patient not taking: Reported on 01/08/2021 08/12/20 11/10/20  Kate Sable, MD  calcium carbonate (TUMS - DOSED IN MG ELEMENTAL CALCIUM) 500 MG chewable tablet Chew 500 mg by mouth daily as needed for indigestion or heartburn.    [provider]  diazepam (VALIUM) 5 MG tablet TAKE 1 TABLET BY MOUTH DAILY 01/02/21   [name], MD  oxyCODONE-acetaminophen (PERCOCET/ROXICET) 5-325 MG tablet Take by mouth. Patient not taking: Reported on 01/08/2021    [provider]    ROS:  Out of a complete 14 system review of symptoms, the patient complains only of the following symptoms, and all other reviewed systems are negative.  Abdominal bloating Shortness of breath  Blood pressure 119/78, pulse 80, height 6\' 1"  (1.854 m), weight 230 lb 3.2 oz (104.4 kg).  Physical Exam  General: The patient is alert and cooperative at the time of the examination.  Skin: No significant peripheral edema is noted.   Neurologic Exam  Mental status: The patient is alert and oriented x 3 at the time of the examination. The patient has apparent normal recent and remote memory, with an apparently normal attention span and concentration ability.   Cranial nerves: Facial symmetry is present. Speech is normal, no aphasia or dysarthria is noted. Extraocular movements are full. Visual fields are full.  Motor: The patient has good strength in all  4 extremities.  Sensory examination: Soft touch sensation is symmetric on the face, arms, and legs.  Coordination: The patient has good finger-nose-finger and heel-to-shin bilaterally.  Gait and station: The patient has a normal gait. Tandem gait is normal. Romberg is negative. No drift is seen.  Reflexes: Deep tendon reflexes are symmetric.   MRI brain 06/23/20:  IMPRESSION: This MRI of the brain without contrast shows the following: 1. Few scattered T2/FLAIR hyperintense foci in the subcortical deep white matter of the hemispheres. This is a nonspecific finding but most likely represents very minimal chronic microvascular ischemic change. None of the foci appear to be acute. 2. There were no acute findings.  * MRI scan images were reviewed online. I agree with the written report.    Cardiac monitor 07/04/20:  Narrative & Impression   Patient had a min HR of  50 bpm, max HR of 160 bpm.  Predominant underlying rhythm was sinus rhythm.  Patient had episode of atrial flutter on 06/18/20 lasting ~ 12 minutes; average rate 80 bpm.  Isolated PACs were rare (<1.0%).  Isolated PVCs were rare (<1.0%).  Trigger events with associated with sinus rhythm or sinus tachycardia.  Atrial Flutter found on monitor with less than 1% burden.   EMG/NCV study 07/09/20:  IMPRESSION:  Nerve conduction studies done on both upper extremities were relatively unremarkable with exception of a very minimal prolongation of the distal motor latency left median nerve.  A borderline carpal tunnel syndrome may be present.  EMG evaluation of the left upper extremity was unremarkable, no evidence of an overlying cervical radiculopathy was seen.    Assessment/Plan:  1.  Low back pain  2.  Left arm pain  3.  Headache  The patient has had good resolution of his symptoms, he has recent been treated for atrial flutter but he still has episodes of abdominal bloating after eating.  I will send  him for a gastroenterological evaluation.  The patient will follow up here if needed, he will call if he has recurrence of symptoms.   Jill Alexanders MD 01/21/2021 9:47 AM  [facility] [address] [place], [place] [address]  Phone [phone] Fax [phone]

## 2021-01-28 ENCOUNTER — Ambulatory Visit: Payer: Medicare Other | Admitting: Internal Medicine

## 2021-02-04 ENCOUNTER — Ambulatory Visit (INDEPENDENT_AMBULATORY_CARE_PROVIDER_SITE_OTHER): Payer: Medicare Other

## 2021-02-04 ENCOUNTER — Encounter: Payer: Self-pay | Admitting: Gastroenterology

## 2021-02-04 ENCOUNTER — Telehealth: Payer: Self-pay | Admitting: Internal Medicine

## 2021-02-04 DIAGNOSIS — I483 Typical atrial flutter: Secondary | ICD-10-CM | POA: Diagnosis not present

## 2021-02-04 LAB — CUP PACEART REMOTE DEVICE CHECK
Date Time Interrogation Session: 20220521171621
Implantable Pulse Generator Implant Date: 20220412

## 2021-02-04 NOTE — Telephone Encounter (Signed)
Schedule with Dr. Candis Schatz please -not urgent

## 2021-02-04 NOTE — Telephone Encounter (Signed)
Thank you Dr. Carlean Purl.   I have scheduled patient with Dr. Candis Schatz.

## 2021-02-04 NOTE — Telephone Encounter (Signed)
Hey Dr. Carlean Purl,   We received a referral in our office (5/10am) for patient because of abdominal bloating. He wants to establish a regular GI doctor. He have been seen at [facility] 12/2019 but states that was a one time thing. Records are in care everywhere. As DOD, could you please review and advise on scheduling?  Thank you

## 2021-02-17 ENCOUNTER — Telehealth: Payer: Self-pay | Admitting: Emergency Medicine

## 2021-02-17 NOTE — Telephone Encounter (Signed)
Spoke with patient and patient advised that on Friday 02/14/21 that he was symptomatic (shortness of breath with fast heart rates and a sensation near his loop recorder. He recorded his heart rate an it was at 130 beats. I advised patient that his loop recorder has not detected any episodes from 01/14/21-02/17/21. I also advised the patient that loop reorders have parameters and settings, and depending on the specific setting the the rate of 130 may not show up as an episode. I advised patient that I would send this information to Dr. Quentin Ore.

## 2021-02-18 ENCOUNTER — Encounter: Payer: Self-pay | Admitting: Emergency Medicine

## 2021-02-18 NOTE — Telephone Encounter (Signed)
I spoke with patient and he states we can mail him a cell adapter. The cell adapter was mailed out 02/18/2021

## 2021-02-18 NOTE — Telephone Encounter (Signed)
LMOM to call device clinic. Need to give patient symptom activator per Dr Quentin Ore.

## 2021-02-19 NOTE — Telephone Encounter (Signed)
He is getting a symptom activator not an adaptor. Sorry I put the wrong thing but he was sent the correct symptom activator.

## 2021-02-21 ENCOUNTER — Ambulatory Visit: Payer: Medicare Other | Admitting: Family Medicine

## 2021-02-21 ENCOUNTER — Ambulatory Visit (INDEPENDENT_AMBULATORY_CARE_PROVIDER_SITE_OTHER): Payer: Medicare Other | Admitting: Family Medicine

## 2021-02-21 ENCOUNTER — Other Ambulatory Visit: Payer: Self-pay

## 2021-02-21 ENCOUNTER — Encounter: Payer: Self-pay | Admitting: Family Medicine

## 2021-02-21 VITALS — BP 125/79 | HR 80 | Ht 73.0 in | Wt 233.4 lb

## 2021-02-21 DIAGNOSIS — J432 Centrilobular emphysema: Secondary | ICD-10-CM | POA: Diagnosis not present

## 2021-02-21 DIAGNOSIS — I483 Typical atrial flutter: Secondary | ICD-10-CM

## 2021-02-21 DIAGNOSIS — F419 Anxiety disorder, unspecified: Secondary | ICD-10-CM | POA: Diagnosis not present

## 2021-02-21 MED ORDER — FLUTICASONE FUROATE-VILANTEROL 100-25 MCG/INH IN AEPB: 1.0000 | INHALATION_SPRAY | Freq: Every day | RESPIRATORY_TRACT | 11 refills | Status: DC

## 2021-02-21 MED ORDER — DIAZEPAM 5 MG PO TABS: 5.0000 mg | ORAL_TABLET | Freq: Two times a day (BID) | ORAL | 1 refills | Status: DC | PRN

## 2021-02-21 NOTE — Assessment & Plan Note (Addendum)
Patient with 25 year hx of smoking, trying to stop now. He does well on [facility]

## 2021-02-21 NOTE — Progress Notes (Signed)
Established Patient Office Visit  SUBJECTIVE:  Subjective  Patient ID: Adam Holloway, male    DOB: 1970/10/19  Age: 50 y.o. MRN: [id number]  CC:  Chief Complaint  Patient presents with   Follow-up   Stress    Patient requesting medication for stress     HPI Adam Holloway is a 49 y.o. male presenting today for     Past Medical History:  Diagnosis Date   Arthritis    back, left wrist, left knee   Complication of anesthesia    bp dropped during gallbladder surgery in 2008   Depression    Family history of adverse reaction to anesthesia    mom stopped breathing during colonscopy   GERD (gastroesophageal reflux disease)    h/o   HA (headache)    h/o migrianes   Herniated disc, cervical    History of kidney stones    h/o stones   History of leukocytosis    Lumbago    Pain in soft tissues of limb    Pancreatitis    Peripheral neuropathy 04/12/2019   PTSD (post-traumatic stress disorder)    Scoliosis     Past Surgical History:  Procedure Laterality Date   A-FLUTTER ABLATION N/A 11/11/2020   Procedure: A-FLUTTER ABLATION;  Surgeon: Vickie Epley, MD;  Location: [place] [facility];  Service: Cardiovascular;  Laterality: N/A;   CHOLECYSTECTOMY  2008   COLONOSCOPY  2010   COLONOSCOPY WITH PROPOFOL N/A 06/09/2016   Procedure: COLONOSCOPY WITH PROPOFOL;  Surgeon: Christene Lye, MD;  Location: [facility];  Service: Endoscopy;  Laterality: N/A;   FISSURECTOMY N/A 06/29/2016   Procedure: FISSURECTOMY;  Surgeon: Christene Lye, MD;  Location: [facility];  Service: General;  Laterality: N/A;   HEMORRHOID SURGERY N/A 06/29/2016   Procedure: HEMORRHOIDECTOMY;  Surgeon: Christene Lye, MD;  Location: [facility];  Service: General;  Laterality: N/A;   PAROTIDECTOMY Left [place] N/A 06/29/2016   Procedure: SPHINCTEROTOMY;  Surgeon: Christene Lye, MD;  Location: [facility];  Service: General;   Laterality: N/A;   WRIST ARTHROSCOPY Left 01/26/2017   Procedure: ARTHROSCOPY LEFT WRIST DEBRIDEMENT;  Surgeon: Daryll Brod, MD;  Location: [place];  Service: Orthopedics;  Laterality: Left;   WRIST SURGERY  2019    Family History  Problem Relation Age of Onset   Cancer Father    Heart Problems Father    Lung cancer Father    Colon cancer Mother    Liver cancer Brother     Social History   Socioeconomic History   Marital status: Single    Spouse name: Not on file   Number of children: 1   Years of education: GED   Highest education level: Not on file  Occupational History    Comment: Disabled  Tobacco Use   Smoking status: Former    Packs/day: 0.50    Years: 32.00    Pack years: 16.00    Types: Cigarettes    Quit date: 11/08/2020    Years since quitting: 0.2   Smokeless tobacco: Former   Tobacco comments:    Quit 2020 and restarted 10/2019 1-2 cig/day  States he quit 2 days before procedure 2022  Vaping Use   Vaping Use: Never used  Substance and Sexual Activity   Alcohol use: No    Alcohol/week: 0.0 standard drinks   Drug use: No   Sexual activity: Not on file  Other Topics Concern   Not on file  Social History Narrative   Lives at home with mother   Right handed.   Caffeine two cups of coffee daily.   Social Determinants of Health   Financial Resource Strain: Not on file  Food Insecurity: Not on file  Transportation Needs: Not on file  Physical Activity: Not on file  Stress: Not on file  Social Connections: Not on file  Intimate Partner Violence: Not on file     Current Outpatient Medications:    diazepam (VALIUM) 5 MG tablet, Take 1 tablet (5 mg total) by mouth every 12 (twelve) hours as needed for anxiety., Disp: 45 tablet, Rfl: 1   fluticasone furoate-vilanterol (BREO ELLIPTA) 100-25 MCG/INH AEPB, Inhale 1 puff into the lungs daily., Disp: , Rfl:    fluticasone furoate-vilanterol (BREO ELLIPTA) 100-25 MCG/INH AEPB, Inhale 1 puff  into the lungs daily., Disp: 1 each, Rfl: 11   pantoprazole (PROTONIX) 40 MG tablet, TAKE 1 TABLET BY MOUTH EVERY DAY, Disp: 45 tablet, Rfl: 0   Vitamin D, Ergocalciferol, (DRISDOL) 1.25 MG (50000 UNIT) CAPS capsule, Take 50,000 Units by mouth every Wednesday., Disp: , Rfl:    Allergies  Allergen Reactions   Augmentin [Amoxicillin-Pot Clavulanate] Anaphylaxis    Liver failure    Atorvastatin     Muscle and joint pain   Nsaids Other (See Comments)    Chest pain   Tolmetin Other (See Comments)    Chest pain   Meloxicam Other (See Comments)    Chest pain    ROS Review of Systems  Constitutional: Negative.   HENT: Negative.    Respiratory:  Positive for shortness of breath.   Cardiovascular: Negative.   Genitourinary: Negative.   Neurological: Negative.   Psychiatric/Behavioral:  Positive for agitation. The patient is nervous/anxious.     OBJECTIVE:    Physical Exam Vitals and nursing note reviewed.  Constitutional:      Appearance: He is obese.  HENT:     Right Ear: Tympanic membrane normal.     Left Ear: Tympanic membrane normal.     Mouth/Throat:     Mouth: Mucous membranes are moist.  Cardiovascular:     Rate and Rhythm: Normal rate and regular rhythm.  Musculoskeletal:        General: Normal range of motion.  Skin:    General: Skin is warm.  Psychiatric:        Mood and Affect: Mood normal.    BP 125/79   Pulse 80   Ht 6\' 1"  (1.854 m)   Wt 233 lb 6.4 oz (105.9 kg)   BMI 30.79 kg/m  Wt Readings from Last 3 Encounters:  02/21/21 233 lb 6.4 oz (105.9 kg)  01/21/21 230 lb 3.2 oz (104.4 kg)  01/08/21 235 lb 5.5 oz (106.8 kg)    Health Maintenance Due  Topic Date Due   COVID-19 Vaccine (1) Never done   TETANUS/TDAP  Never done   Zoster Vaccines- Shingrix (1 of 2) Never done    There are no preventive care reminders to display for this patient.  CBC Latest Ref Rng & Units 01/08/2021 11/08/2020 11/05/2020  WBC 4.0 - 10.5 K/uL 11.5(H) 10.0 9.1  Hemoglobin  13.0 - 17.0 g/dL 15.2 15.5 14.5  Hematocrit 39.0 - 52.0 % 45.2 46.5 43.7  Platelets 150 - 400 K/uL 168 195 170   CMP Latest Ref Rng & Units 01/08/2021 11/08/2020 07/23/2020  Glucose 70 - 99 mg/dL 86 99 88  BUN 6 -  20 mg/dL 15 15 14   Creatinine 0.61 - 1.24 mg/dL 1.24 1.00 1.01  Sodium 135 - 145 mmol/L 137 137 140  Potassium 3.5 - 5.1 mmol/L 4.3 4.6 4.2  Chloride 98 - 111 mmol/L 102 106 103  CO2 22 - 32 mmol/L 27 23 25   Calcium 8.9 - 10.3 mg/dL 9.3 9.8 9.1  Total Protein 6.5 - 8.1 g/dL 7.3 7.6 7.4  Total Bilirubin 0.3 - 1.2 mg/dL 0.2(L) 0.5 0.4  Alkaline Phos 38 - 126 U/L 66 69 76  AST 15 - 41 U/L 15 20 20   ALT 0 - 44 U/L 19 26 27     Lab Results  Component Value Date   TSH 1.670 04/12/2019   Lab Results  Component Value Date   ALBUMIN 4.5 01/08/2021   ANIONGAP 8 01/08/2021   Lab Results  Component Value Date   CHOL 193 08/20/2020   HDL 44 08/20/2020   LDLCALC 130 (H) 08/20/2020   CHOLHDL 4.4 08/20/2020   Lab Results  Component Value Date   TRIG 96 08/20/2020   Lab Results  Component Value Date   HGBA1C 5.7 (H) 04/12/2019      ASSESSMENT & PLAN:   Problem List Items Addressed This Visit       Cardiovascular and Mediastinum   Typical atrial flutter (HCC)     Respiratory   Centrilobular emphysema (HCC)   Relevant Medications   fluticasone furoate-vilanterol (BREO ELLIPTA) 100-25 MCG/INH AEPB   fluticasone furoate-vilanterol (BREO ELLIPTA) 100-25 MCG/INH AEPB     Other   Anxiety - Primary    Patient with long hx of anxiety with periodic anxiety attacks with family stress. He takes, 1 to 1.5 per day. He is very aware of addiction and has worked hard to keep substance free. He has been having trouble getting into his Psych provider since covid pandemic Dr Kasandra Knudsen.        Relevant Medications   diazepam (VALIUM) 5 MG tablet    Meds ordered this encounter  Medications   diazepam (VALIUM) 5 MG tablet    Sig: Take 1 tablet (5 mg total) by mouth every 12 (twelve)  hours as needed for anxiety.    Dispense:  45 tablet    Refill:  1   fluticasone furoate-vilanterol (BREO ELLIPTA) 100-25 MCG/INH AEPB    Sig: Inhale 1 puff into the lungs daily.    Dispense:  1 each    Refill:  11      Follow-up: No follow-ups on file.    Beckie Salts, [place] [address], [place], [place] [address]

## 2021-02-21 NOTE — Assessment & Plan Note (Addendum)
Patient with long hx of anxiety with periodic anxiety attacks with family stress. He takes, 1 to 1.5 per day. He is very aware of addiction and has worked hard to keep substance free. He has been having trouble getting into his Psych provider since covid pandemic Dr Kasandra Knudsen.

## 2021-02-21 NOTE — Assessment & Plan Note (Signed)
Had cardiac ablation 10/2020 has been doing well since. He is not taking Anticoag now, Has a loop recorder currently to catch any A Fib events.

## 2021-02-26 NOTE — Progress Notes (Signed)
Carelink Summary Report / Loop Recorder 

## 2021-02-28 ENCOUNTER — Ambulatory Visit (INDEPENDENT_AMBULATORY_CARE_PROVIDER_SITE_OTHER): Payer: Medicare Other | Admitting: Cardiology

## 2021-02-28 ENCOUNTER — Other Ambulatory Visit: Payer: Self-pay

## 2021-02-28 ENCOUNTER — Encounter: Payer: Self-pay | Admitting: Cardiology

## 2021-02-28 VITALS — BP 124/74 | HR 86 | Ht 73.0 in | Wt 230.0 lb

## 2021-02-28 DIAGNOSIS — I4892 Unspecified atrial flutter: Secondary | ICD-10-CM

## 2021-02-28 DIAGNOSIS — I2510 Atherosclerotic heart disease of native coronary artery without angina pectoris: Secondary | ICD-10-CM | POA: Diagnosis not present

## 2021-02-28 MED ORDER — VERAPAMIL HCL ER 120 MG PO TBCR: 120.0000 mg | EXTENDED_RELEASE_TABLET | Freq: Every day | ORAL | 5 refills | Status: DC

## 2021-02-28 NOTE — Patient Instructions (Signed)
Medication Instructions:  Your physician has recommended you make the following change in your medication:    START taking Verapamil (Calan-SR) 120 MG once a day.  *If you need a refill on your cardiac medications before your next appointment, please call your pharmacy*   Lab Work: None ordered If you have labs (blood work) drawn today and your tests are completely normal, you will receive your results only by: [place] (if you have MyChart) OR A paper copy in the mail If you have any lab test that is abnormal or we need to change your treatment, we will call you to review the results.   Testing/Procedures: None ordered   Follow-Up: At [facility], you and your health needs are our priority.  As part of our continuing mission to provide you with exceptional heart care, we have created designated Provider Care Teams.  These Care Teams include your primary Cardiologist (physician) and Advanced Practice Providers (APPs -  Physician Assistants and Nurse Practitioners) who all work together to provide you with the care you need, when you need it.  We recommend signing up for the patient portal called "MyChart".  Sign up information is provided on this After Visit Summary.  MyChart is used to connect with patients for Virtual Visits (Telemedicine).  Patients are able to view lab/test results, encounter notes, upcoming appointments, etc.  Non-urgent messages can be sent to your provider as well.   To learn more about what you can do with MyChart, go to [email or web address].    Your next appointment:   3-6 months   The format for your next appointment:   In Person  Provider:   Kate Sable, MD   Other Instructions

## 2021-02-28 NOTE — Progress Notes (Signed)
Cardiology Office Note:    Date:  02/28/2021   ID:  Adam Holloway, DOB 1970/10/31, MRN [id number]  PCP:  [name], MD  [facility] Cardiologist:  Kate Sable, MD  [facility] Electrophysiologist:  Vickie Epley, MD   Referring MD: [name], MD   Chief Complaint  Patient presents with   Other    Patient wanted to be seen for Elevated HR. Meds reviewed verbally with patient.     History of Present Illness:    Adam Holloway is a 50 y.o. male with a hx of atrial flutter s/p RFA 10/2020, CAD (45-50% mLAD), migraines, PTSD, GERD, former smoker x30+ years who presents for follow-up.    Patient being seen for atrial flutter and nonobstructive CAD.  Underwent RFA ablation, being seen due to elevated heart rates today.  He states having symptoms of shortness of breath when his heart rates abruptly changed from 60s to about 90s beats per minute.  He has an ILR in place, this has been interrogated with no return of atrial flutter.  Eliquis has since been stopped.  He tried taking Metroprolol in the past but did not tolerate due to feeling drowsy.   Prior notes Cardiac monitor was placed on 06/2020 showing about 12 minutes of atrial flutter, less than 1% burden.  Underwent RFA 10/2020. CCTA 07/2020 with calcium score of 123, calcified plaque in the mid LAD causing mild to moderate stenosis 45 to 50%.  CT FFR with no significant stenosis.   Past Medical History:  Diagnosis Date   Arthritis    back, left wrist, left knee   Complication of anesthesia    bp dropped during gallbladder surgery in 2008   Depression    Family history of adverse reaction to anesthesia    mom stopped breathing during colonscopy   GERD (gastroesophageal reflux disease)    h/o   HA (headache)    h/o migrianes   Herniated disc, cervical    History of kidney stones    h/o stones   History of leukocytosis    Lumbago    Pain in soft tissues of limb    Pancreatitis    Peripheral  neuropathy 04/12/2019   PTSD (post-traumatic stress disorder)    Scoliosis     Past Surgical History:  Procedure Laterality Date   A-FLUTTER ABLATION N/A 11/11/2020   Procedure: A-FLUTTER ABLATION;  Surgeon: Vickie Epley, MD;  Location: [place] [facility];  Service: Cardiovascular;  Laterality: N/A;   CHOLECYSTECTOMY  2008   COLONOSCOPY  2010   COLONOSCOPY WITH PROPOFOL N/A 06/09/2016   Procedure: COLONOSCOPY WITH PROPOFOL;  Surgeon: Christene Lye, MD;  Location: [facility];  Service: Endoscopy;  Laterality: N/A;   FISSURECTOMY N/A 06/29/2016   Procedure: FISSURECTOMY;  Surgeon: Christene Lye, MD;  Location: [facility];  Service: General;  Laterality: N/A;   HEMORRHOID SURGERY N/A 06/29/2016   Procedure: HEMORRHOIDECTOMY;  Surgeon: Christene Lye, MD;  Location: [facility];  Service: General;  Laterality: N/A;   PAROTIDECTOMY Left [place] N/A 06/29/2016   Procedure: SPHINCTEROTOMY;  Surgeon: Christene Lye, MD;  Location: [facility];  Service: General;  Laterality: N/A;   WRIST ARTHROSCOPY Left 01/26/2017   Procedure: ARTHROSCOPY LEFT WRIST DEBRIDEMENT;  Surgeon: Daryll Brod, MD;  Location: [place];  Service: Orthopedics;  Laterality: Left;   WRIST SURGERY  2019    Current Medications: Current Meds  Medication  Sig   diazepam (VALIUM) 5 MG tablet Take 1 tablet (5 mg total) by mouth every 12 (twelve) hours as needed for anxiety.   fluticasone furoate-vilanterol (BREO ELLIPTA) 100-25 MCG/INH AEPB Inhale 1 puff into the lungs daily.   pantoprazole (PROTONIX) 40 MG tablet TAKE 1 TABLET BY MOUTH EVERY DAY   verapamil (CALAN-SR) 120 MG CR tablet Take 1 tablet (120 mg total) by mouth at bedtime.   Vitamin D, Ergocalciferol, (DRISDOL) 1.25 MG (50000 UNIT) CAPS capsule Take 50,000 Units by mouth every Wednesday.     Allergies:   Augmentin [amoxicillin-pot clavulanate], Atorvastatin, Nsaids,  Tolmetin, and Meloxicam   Social History   Socioeconomic History   Marital status: Single    Spouse name: Not on file   Number of children: 1   Years of education: GED   Highest education level: Not on file  Occupational History    Comment: Disabled  Tobacco Use   Smoking status: Former    Packs/day: 0.50    Years: 32.00    Pack years: 16.00    Types: Cigarettes    Quit date: 11/08/2020    Years since quitting: 0.3   Smokeless tobacco: Former   Tobacco comments:    Quit 2020 and restarted 10/2019 1-2 cig/day  States he quit 2 days before procedure 2022  Vaping Use   Vaping Use: Never used  Substance and Sexual Activity   Alcohol use: No    Alcohol/week: 0.0 standard drinks   Drug use: No   Sexual activity: Not on file  Other Topics Concern   Not on file  Social History Narrative   Lives at home with mother   Right handed.   Caffeine two cups of coffee daily.   Social Determinants of Health   Financial Resource Strain: Not on file  Food Insecurity: Not on file  Transportation Needs: Not on file  Physical Activity: Not on file  Stress: Not on file  Social Connections: Not on file     Family History: The patient's family history includes Cancer in his father; Colon cancer in his mother; Heart Problems in his father; Liver cancer in his brother; Lung cancer in his father.  ROS:   Please see the history of present illness.     All other systems reviewed and are negative.  EKGs/Labs/Other Studies Reviewed:    The following studies were reviewed today:   EKG:  EKG is  ordered today.  The ekg ordered today demonstrates normal sinus rhythm, normal ECG.  Recent Labs: 01/08/2021: ALT 19; BUN 15; Creatinine, Ser 1.24; Hemoglobin 15.2; Platelets 168; Potassium 4.3; Sodium 137  Recent Lipid Panel    Component Value Date/Time   CHOL 193 08/20/2020 1005   TRIG 96 08/20/2020 1005   HDL 44 08/20/2020 1005   CHOLHDL 4.4 08/20/2020 1005   VLDL 19 08/20/2020 1005    LDLCALC 130 (H) 08/20/2020 1005     Risk Assessment/Calculations:      Physical Exam:    VS:  BP 124/74 (BP Location: Left Arm, Patient Position: Sitting, Cuff Size: Normal)   Pulse 86   Ht 6\' 1"  (1.854 m)   Wt 230 lb (104.3 kg)   SpO2 98%   BMI 30.34 kg/m     Wt Readings from Last 3 Encounters:  02/28/21 230 lb (104.3 kg)  02/21/21 233 lb 6.4 oz (105.9 kg)  01/21/21 230 lb 3.2 oz (104.4 kg)     GEN:  Well nourished, well developed in no acute distress HEENT:  Normal NECK: No JVD; No carotid bruits LYMPHATICS: No lymphadenopathy CARDIAC: RRR, no murmurs, rubs, gallops RESPIRATORY:  Clear to auscultation without rales, wheezing or rhonchi  ABDOMEN: Soft, non-tender, non-distended MUSCULOSKELETAL:  No edema; No deformity  SKIN: Warm and dry NEUROLOGIC:  Alert and oriented x 3 PSYCHIATRIC:  Normal affect   ASSESSMENT:    1. Atrial flutter, unspecified type ([place])   2. Coronary artery disease involving native coronary artery of native heart, unspecified whether angina present     PLAN:    In order of problems listed above:  atrial flutter s/p RFA 10/2020, ILR in place, no return of atrial flutter upon last interrogation.  Has shortness of breath with increased heart rates typically in the range of 60-90.  We will start verapamil to see if this will blunt patient's heart rate and help with symptoms.  He states having a history of anxiety/PTSD.  Advised to take Valium to see if this will also help with his symptoms. Mild to moderate LAD stenosis 45 to 50%.  CT FFR with no significant stenosis.  Continue Lipitor.  Follow-up in 3-6 months  Shared Decision Making/Informed Consent      Medication Adjustments/Labs and Tests Ordered: Current medicines are reviewed at length with the patient today.  Concerns regarding medicines are outlined above.  Orders Placed This Encounter  Procedures   EKG 12-Lead    Meds ordered this encounter  Medications   verapamil (CALAN-SR)  120 MG CR tablet    Sig: Take 1 tablet (120 mg total) by mouth at bedtime.    Dispense:  30 tablet    Refill:  5     Patient Instructions  Medication Instructions:  Your physician has recommended you make the following change in your medication:    START taking Verapamil (Calan-SR) 120 MG once a day.  *If you need a refill on your cardiac medications before your next appointment, please call your pharmacy*   Lab Work: None ordered If you have labs (blood work) drawn today and your tests are completely normal, you will receive your results only by: [place] (if you have MyChart) OR A paper copy in the mail If you have any lab test that is abnormal or we need to change your treatment, we will call you to review the results.   Testing/Procedures: None ordered   Follow-Up: At [facility], you and your health needs are our priority.  As part of our continuing mission to provide you with exceptional heart care, we have created designated Provider Care Teams.  These Care Teams include your primary Cardiologist (physician) and Advanced Practice Providers (APPs -  Physician Assistants and Nurse Practitioners) who all work together to provide you with the care you need, when you need it.  We recommend signing up for the patient portal called "MyChart".  Sign up information is provided on this After Visit Summary.  MyChart is used to connect with patients for Virtual Visits (Telemedicine).  Patients are able to view lab/test results, encounter notes, upcoming appointments, etc.  Non-urgent messages can be sent to your provider as well.   To learn more about what you can do with MyChart, go to [email or web address].    Your next appointment:   3-6 months   The format for your next appointment:   In Person  Provider:   Kate Sable, MD   Other Instructions     Signed, Kate Sable, MD  02/28/2021 4:42 PM    [place]

## 2021-03-03 ENCOUNTER — Ambulatory Visit: Payer: Medicare Other | Admitting: Cardiology

## 2021-03-07 LAB — CUP PACEART REMOTE DEVICE CHECK
Date Time Interrogation Session: 20220623171336
Implantable Pulse Generator Implant Date: 20220412

## 2021-03-10 ENCOUNTER — Ambulatory Visit (INDEPENDENT_AMBULATORY_CARE_PROVIDER_SITE_OTHER): Payer: Medicare Other

## 2021-03-10 DIAGNOSIS — I4892 Unspecified atrial flutter: Secondary | ICD-10-CM

## 2021-03-11 ENCOUNTER — Telehealth: Payer: Self-pay

## 2021-03-11 NOTE — Telephone Encounter (Signed)
Spoke with pt. Regarding tachy episodes recorded.  He reports at time of events he was doing nothing when he had sudden feeling of chest pain and pressure.  He was not able to do anything except lie down until it resolved.  He states he used te symptom activator to mark events.  It appears he may have been provided with the wrong symptom activator or the activator he has is not working properly since no symptoms have been recorded on his device.  Advised I will work on getting a new symptom activator to him.  Also advised I would forward info about these episodes to Dr. Quentin Ore for review.

## 2021-03-11 NOTE — Telephone Encounter (Signed)
PT is returning a call 

## 2021-03-11 NOTE — Telephone Encounter (Signed)
Unsuccessful telephone encounter to Adam Holloway to follow up with his concerns over AT events on loop recorder. HIPAA compliant VM message left requesting call back to [phone].

## 2021-03-18 ENCOUNTER — Ambulatory Visit (INDEPENDENT_AMBULATORY_CARE_PROVIDER_SITE_OTHER): Payer: Medicare Other | Admitting: Gastroenterology

## 2021-03-18 ENCOUNTER — Encounter: Payer: Self-pay | Admitting: Gastroenterology

## 2021-03-18 ENCOUNTER — Other Ambulatory Visit: Payer: Medicare Other

## 2021-03-18 VITALS — BP 110/60 | HR 77 | Ht 71.0 in | Wt 229.0 lb

## 2021-03-18 DIAGNOSIS — R12 Heartburn: Secondary | ICD-10-CM

## 2021-03-18 DIAGNOSIS — R140 Abdominal distension (gaseous): Secondary | ICD-10-CM

## 2021-03-18 DIAGNOSIS — R1012 Left upper quadrant pain: Secondary | ICD-10-CM | POA: Diagnosis not present

## 2021-03-18 MED ORDER — FAMOTIDINE 20 MG PO TABS: 20.0000 mg | ORAL_TABLET | Freq: Two times a day (BID) | ORAL | 3 refills | Status: DC

## 2021-03-18 NOTE — Progress Notes (Signed)
HPI : [name] is a 50 y/o male referred to GI with symptoms of post prandial abdominal pain and bloating as well as symptoms of palpitations, weakness and dyspnea following meals.   Last year, to evaluate the symptoms he underwent a cardiac monitoring study and was found to have atrial flutter.  He underwent a cardiac ablation in Feb 2022 and has had no return of his a-flutter, although he has continued to have symptoms. He describes having these symptoms almost exclusively after meals, but not necessarily after all meals.  Symptoms typically start within 10 minutes of a meal and will last for hours.  Symptoms are more likely to be triggered by bread and large meals.  He denies flushing, urge to defecate or diaphoresis during these episodes. His abdominal pain is always in the LUQ and radiates into his back.  Nausea, but no vomiting.  He reports a history of pancreatitis in April 2020 manifested by intense upper abdominal pain, nausea, anorexia and profound weakness.  He was seen at [facility] ER and was sent home after his CT and labs were unremarkable (other than a distended bladder).  He reports that his symptoms lasted 4-5 days but it took 3 months to fully recover.  He has a history of heavy alcohol use, but states that he quit drinking completely in 2001.   He is also a former smoker, stating that he stopped smoking after his episode of pancreatitis in 2020 He has a history of chronic pain and opioid dependence, but states he has been off narcotics for about a year now.  He took Eliquis for a few months around the time of his ablation and reports that this medication caused significant constipation.  He stopped taking Eliquis in May and says that the constipation has improved but his frequency is still less than it used to be (used to have 2-3 bowel movements per day, now having about 1/day). 's are often hard and difficult to pass.  He denies any diarrhea or blood in the stool. Now going  once a day, stools are hard and formed, sometimes has to strain.  He used to take Miralax which worked well, but is no longer able to get this medication as a prescription.  He has a history of GERD as well, and takes Protonix as needed, 2 or 3 days/week.  He was seen by Dr. Brandon Holloway at [facility] last April for the same complaints.  He underwent an upper and lower endoscopy at that time.  His EGD was notable only for some mild antral erythema with fovlear metaplasia on biopsies.  His colonoscopy was notable for 3 small polyps and he was recommended to repeat colonoscopy in 5 years.    Past Medical History:  Diagnosis Date   Arthritis    back, left wrist, left knee   Complication of anesthesia    bp dropped during gallbladder surgery in 2008   Depression    Family history of adverse reaction to anesthesia    mom stopped breathing during colonscopy   GERD (gastroesophageal reflux disease)    h/o   HA (headache)    h/o migrianes   Herniated disc, cervical    History of kidney stones    h/o stones   History of leukocytosis    Lumbago    Pain in soft tissues of limb    Pancreatitis    Peripheral neuropathy 04/12/2019   PTSD (post-traumatic stress disorder)    Scoliosis      Past  Surgical History:  Procedure Laterality Date   A-FLUTTER ABLATION N/A 11/11/2020   Procedure: A-FLUTTER ABLATION;  Surgeon: Vickie Epley, MD;  Location: [place] [facility];  Service: Cardiovascular;  Laterality: N/A;   CHOLECYSTECTOMY  2008   COLONOSCOPY  2010   COLONOSCOPY WITH PROPOFOL N/A 06/09/2016   Procedure: COLONOSCOPY WITH PROPOFOL;  Surgeon: Christene Lye, MD;  Location: [facility];  Service: Endoscopy;  Laterality: N/A;   ESOPHAGOGASTRODUODENOSCOPY     FISSURECTOMY N/A 06/29/2016   Procedure: FISSURECTOMY;  Surgeon: Christene Lye, MD;  Location: [facility];  Service: General;  Laterality: N/A;   HEMORRHOID SURGERY N/A 06/29/2016   Procedure: HEMORRHOIDECTOMY;  Surgeon:  Christene Lye, MD;  Location: [facility];  Service: General;  Laterality: N/A;   LOOP RECORDER IMPLANT  01/2021   PAROTIDECTOMY Left [place] N/A 06/29/2016   Procedure: SPHINCTEROTOMY;  Surgeon: Christene Lye, MD;  Location: [facility];  Service: General;  Laterality: N/A;   WRIST ARTHROSCOPY Left 01/26/2017   Procedure: ARTHROSCOPY LEFT WRIST DEBRIDEMENT;  Surgeon: Daryll Brod, MD;  Location: [place];  Service: Orthopedics;  Laterality: Left;   WRIST SURGERY  2019   Family History  Problem Relation Age of Onset   Colon cancer Mother    Heart Problems Father    Lung cancer Father    Liver cancer Brother    Social History   Tobacco Use   Smoking status: Former    Packs/day: 0.50    Years: 32.00    Pack years: 16.00    Types: Cigarettes    Quit date: 11/08/2020    Years since quitting: 0.3   Smokeless tobacco: Former   Tobacco comments:    Quit 2020 and restarted 10/2019 1-2 cig/day  States he quit 2 days before procedure 2022  Vaping Use   Vaping Use: Never used  Substance Use Topics   Alcohol use: No    Alcohol/week: 0.0 standard drinks   Drug use: No   Current Outpatient Medications  Medication Sig Dispense Refill   diazepam (VALIUM) 5 MG tablet Take 1 tablet (5 mg total) by mouth every 12 (twelve) hours as needed for anxiety. 45 tablet 1   famotidine (PEPCID) 20 MG tablet Take 1 tablet (20 mg total) by mouth in the morning and at bedtime. Take one tablet every 12 hours as needed. 60 tablet 3   fluticasone furoate-vilanterol (BREO ELLIPTA) 100-25 MCG/INH AEPB Inhale 1 puff into the lungs daily. 1 each 11   pantoprazole (PROTONIX) 40 MG tablet TAKE 1 TABLET BY MOUTH EVERY DAY 45 tablet 0   Vitamin D, Ergocalciferol, (DRISDOL) 1.25 MG (50000 UNIT) CAPS capsule Take 50,000 Units by mouth every Wednesday.     verapamil (CALAN-SR) 120 MG CR tablet Take 1 tablet (120 mg total) by mouth at bedtime. 30  tablet 5   No current facility-administered medications for this visit.   Allergies  Allergen Reactions   Augmentin [Amoxicillin-Pot Clavulanate] Anaphylaxis    Liver failure    Atorvastatin     Muscle and joint pain   Nsaids Other (See Comments)    Chest pain   Tolmetin Other (See Comments)    Chest pain   Meloxicam Other (See Comments)    Chest pain     Review of Systems: All systems reviewed and negative except where noted in HPI.    CUP PACEART REMOTE DEVICE CHECK  Result Date: 03/07/2021 ILR summary report  received. Battery status OK. Normal device function. No new symptom, tachy, brady, or pause episodes. (4) AT events; longest logged 4 minutes; ECGs suggest AT vs ST. Monthly summary reports and ROV/PRN. HB   Physical Exam: BP 110/60   Pulse 77   Ht 5\' 11"  (1.803 m)   Wt 229 lb (103.9 kg)   BMI 31.94 kg/m  Constitutional: Pleasant,well-developed Caucasian male in no acute distress. HEENT: Normocephalic and atraumatic. Conjunctivae are normal. No scleral icterus. Cardiovascular: Normal rate, regular rhythm.  Pulmonary/chest: Effort normal and breath sounds normal. No wheezing, rales or rhonchi. Abdominal: Soft, nondistended.  Mild tenderness to palpation in the left upper quadrant.  No rigidity or guarding.  Bowel sounds active throughout. There are no masses palpable. No hepatomegaly. Extremities: no edema Neurological: Alert and oriented to person place and time. Skin: Skin is warm and dry. No rashes noted. Psychiatric: Normal mood and affect. Behavior is normal.  EXAM: CT ABDOMEN AND PELVIS WITH CONTRAST   TECHNIQUE: Multidetector CT imaging of the abdomen and pelvis was performed using the standard protocol following bolus administration of intravenous contrast.   CONTRAST:  169mL OMNIPAQUE IOHEXOL 300 MG/ML  SOLN   COMPARISON:  10/20/2016   FINDINGS: Lower chest: Unremarkable   Hepatobiliary: No suspicious focal abnormality within the  liver parenchyma. Gallbladder surgically absent. No intrahepatic or extrahepatic biliary dilation.   Pancreas: No focal mass lesion. No dilatation of the main duct. No intraparenchymal cyst. No peripancreatic edema.   Spleen: No splenomegaly. No focal mass lesion.   Adrenals/Urinary Tract: No adrenal nodule or mass. Mild hydroureteronephrosis may be secondary to bladder distention. 9 mm low-density lesion interpolar right kidney cannot be definitively characterized but is stable since prior study suggesting benign etiology such as cyst. Bladder is distended.   Stomach/Bowel: Tiny hiatal hernia. Stomach otherwise unremarkable. Duodenum is normally positioned as is the ligament of Treitz. No small bowel wall thickening. No small bowel dilatation. The terminal ileum is normal. The appendix is normal. No gross colonic mass. No colonic wall thickening.   Vascular/Lymphatic: There is abdominal aortic atherosclerosis without aneurysm. There is no gastrohepatic or hepatoduodenal ligament lymphadenopathy. No intraperitoneal or retroperitoneal lymphadenopathy. No pelvic sidewall lymphadenopathy.   Reproductive: The prostate gland and seminal vesicles are unremarkable.   Other: No intraperitoneal free fluid.   Musculoskeletal: No worrisome lytic or sclerotic osseous abnormality.   IMPRESSION: No acute findings in the abdomen or pelvis. Specifically, no findings to explain the patient's history of right lower quadrant pain.   Distended urinary bladder. Prominence of the intrarenal collecting systems and ureters bilaterally likely related to the bladder distention.   Aortic Atherosclerois (ICD10-170.0)     Electronically Signed   By: Misty Stanley M.D.   On: 12/22/2018 18:42  CBC Latest Ref Rng & Units 01/08/2021 11/08/2020 11/05/2020  WBC 4.0 - 10.5 K/uL 11.5(H) 10.0 9.1  Hemoglobin 13.0 - 17.0 g/dL 15.2 15.5 14.5  Hematocrit 39.0 - 52.0 % 45.2 46.5 43.7  Platelets 150 - 400  K/uL 168 195 170   CMP     Component Value Date/Time   NA 137 01/08/2021 1435   NA 139 01/19/2019 1129   K 4.3 01/08/2021 1435   CL 102 01/08/2021 1435   CO2 27 01/08/2021 1435   GLUCOSE 86 01/08/2021 1435   BUN 15 01/08/2021 1435   BUN 8 01/19/2019 1129   CREATININE 1.24 01/08/2021 1435   CALCIUM 9.3 01/08/2021 1435   PROT 7.3 01/08/2021 1435   PROT 7.1 04/12/2019 1517  ALBUMIN 4.5 01/08/2021 1435   ALBUMIN 4.5 01/19/2019 1129   AST 15 01/08/2021 1435   ALT 19 01/08/2021 1435   ALKPHOS 66 01/08/2021 1435   BILITOT 0.2 (L) 01/08/2021 1435   BILITOT 0.3 01/19/2019 1129   GFRNONAA >60 01/08/2021 1435   GFRAA >60 05/27/2020 1223        ASSESSMENT AND PLAN: 50 y/o male with bothersome postprandial abdominal pain and bloating for >1 year.  These symptoms are also associated with palpitations, dyspnea and weakness and although he previously was noted to have runs of atrial flutter, these arrhythmias have not been noted since his cardiac ablation in Feb.  His GI symptoms are rather typical for dyspepsia, but the other symptoms are atypical. He notes a reliable association of these symptoms with bread ingestion, so celiac disease or gluten sensitivity are possible. The patient is concerned about his pancreas, and although he had symptoms suggestive of pancreatitis, his CT and lipase results did not support that diagnosis in 2020.  Nevertheless, he does have risk factors for chronic pancreatitis (history of heavy alcohol use and tobacco use), so it is reasonable to assess for CPI with fecal elastase.  Plan:  - Check TTG/IgA  - Recommend trial of strict gluten free diet for 4 weeks.    - Will also check H. Pylori stool antigen.  Recommended patient discontinue PPI for 2 weeks prior to submitting stool sample.  - Fecal elastase to screen for chronic pancreatitis  GERD His symptoms of heartburn are rarely infrequent, and he is concerned about possible adverse effects of chronic PPI  use.  I recommended he try Pepcid PRN q12hrs instead of daily Protonix and he was agreeable to this.  The patient has occasional difficulty with hard stools and straining so I recommended he try a daily fiber supplement such as metamucil to improve his stool bulk and consistency.

## 2021-03-18 NOTE — Patient Instructions (Addendum)
If you are age 52 or older, your body mass index should be between 23-30. Your Body mass index is 31.94 kg/m. If this is out of the aforementioned range listed, please consider follow up with your Primary Care Provider.  If you are age 39 or younger, your body mass index should be between 19-25. Your Body mass index is 31.94 kg/m. If this is out of the aformentioned range listed, please consider follow up with your Primary Care Provider.   Your provider has requested that you go to the basement level for lab work before leaving today. Press "B" on the elevator. The lab is located at the first door on the left as you exit the elevator.  Stop Protonix.  Start Pepcid.  Start a Gluten free diet after blood test.   The [facility] providers would like to encourage you to use [facility] to communicate with providers for non-urgent requests or questions.  Due to long hold times on the telephone, sending your provider a message by [facility] may be a faster and more efficient way to get a response.  Please allow 48 business hours for a response.  Please remember that this is for non-urgent requests.   Due to recent changes in healthcare laws, you may see the results of your imaging and laboratory studies on MyChart before your provider has had a chance to review them.  We understand that in some cases there may be results that are confusing or concerning to you. Not all laboratory results come back in the same time frame and the provider may be waiting for multiple results in order to interpret others.  Please give [place] 48 hours in order for your provider to thoroughly review all the results before contacting the office for clarification of your results.   It was a pleasure to see you today!  Thank you for trusting me with your gastrointestinal care!     Scott E. Candis Schatz

## 2021-03-19 LAB — TISSUE TRANSGLUTAMINASE, IGA: (tTG) Ab, IgA: <1 U/mL

## 2021-03-19 LAB — IGA: Immunoglobulin A: 137 mg/dL (ref 47–310)

## 2021-03-28 NOTE — Progress Notes (Signed)
Carelink Summary Report / Loop Recorder 

## 2021-03-31 ENCOUNTER — Other Ambulatory Visit: Payer: Medicare Other

## 2021-03-31 DIAGNOSIS — R1012 Left upper quadrant pain: Secondary | ICD-10-CM

## 2021-03-31 DIAGNOSIS — R140 Abdominal distension (gaseous): Secondary | ICD-10-CM

## 2021-04-04 ENCOUNTER — Ambulatory Visit (INDEPENDENT_AMBULATORY_CARE_PROVIDER_SITE_OTHER): Payer: Medicare Other | Admitting: Family Medicine

## 2021-04-04 ENCOUNTER — Other Ambulatory Visit: Payer: Self-pay

## 2021-04-04 ENCOUNTER — Encounter: Payer: Self-pay | Admitting: Family Medicine

## 2021-04-04 VITALS — BP 134/83 | HR 74 | Ht 71.0 in | Wt 232.8 lb

## 2021-04-04 DIAGNOSIS — F419 Anxiety disorder, unspecified: Secondary | ICD-10-CM

## 2021-04-04 MED ORDER — DIAZEPAM 5 MG PO TABS: 5.0000 mg | ORAL_TABLET | Freq: Two times a day (BID) | ORAL | 1 refills | Status: DC | PRN

## 2021-04-04 NOTE — Progress Notes (Signed)
Established Patient Office Visit  SUBJECTIVE:  Subjective  Patient ID: Adam Holloway, male    DOB: 08-21-1971  Age: 50 y.o. MRN: [id number]  CC:  Chief Complaint  Patient presents with   Anxiety    HPI Adam Holloway is a 50 y.o. male presenting today for     Past Medical History:  Diagnosis Date   Arthritis    back, left wrist, left knee   Complication of anesthesia    bp dropped during gallbladder surgery in 2008   Depression    Family history of adverse reaction to anesthesia    mom stopped breathing during colonscopy   GERD (gastroesophageal reflux disease)    h/o   HA (headache)    h/o migrianes   Herniated disc, cervical    History of kidney stones    h/o stones   History of leukocytosis    Lumbago    Pain in soft tissues of limb    Pancreatitis    Peripheral neuropathy 04/12/2019   PTSD (post-traumatic stress disorder)    Scoliosis     Past Surgical History:  Procedure Laterality Date   A-FLUTTER ABLATION N/A 11/11/2020   Procedure: A-FLUTTER ABLATION;  Surgeon: Vickie Epley, MD;  Location: [place] [facility];  Service: Cardiovascular;  Laterality: N/A;   CHOLECYSTECTOMY  2008   COLONOSCOPY  2010   COLONOSCOPY WITH PROPOFOL N/A 06/09/2016   Procedure: COLONOSCOPY WITH PROPOFOL;  Surgeon: Christene Lye, MD;  Location: [facility];  Service: Endoscopy;  Laterality: N/A;   ESOPHAGOGASTRODUODENOSCOPY     FISSURECTOMY N/A 06/29/2016   Procedure: FISSURECTOMY;  Surgeon: Christene Lye, MD;  Location: [facility];  Service: General;  Laterality: N/A;   HEMORRHOID SURGERY N/A 06/29/2016   Procedure: HEMORRHOIDECTOMY;  Surgeon: Christene Lye, MD;  Location: [facility];  Service: General;  Laterality: N/A;   LOOP RECORDER IMPLANT  01/2021   PAROTIDECTOMY Left [place] N/A 06/29/2016   Procedure: SPHINCTEROTOMY;  Surgeon: Christene Lye, MD;  Location: [facility];  Service:  General;  Laterality: N/A;   WRIST ARTHROSCOPY Left 01/26/2017   Procedure: ARTHROSCOPY LEFT WRIST DEBRIDEMENT;  Surgeon: Daryll Brod, MD;  Location: [place];  Service: Orthopedics;  Laterality: Left;   WRIST SURGERY  2019    Family History  Problem Relation Age of Onset   Colon cancer Mother    Heart Problems Father    Lung cancer Father    Liver cancer Brother     Social History   Socioeconomic History   Marital status: Single    Spouse name: Not on file   Number of children: 1   Years of education: GED   Highest education level: Not on file  Occupational History    Comment: Disabled  Tobacco Use   Smoking status: Former    Packs/day: 0.50    Years: 32.00    Pack years: 16.00    Types: Cigarettes    Quit date: 11/08/2020    Years since quitting: 0.4   Smokeless tobacco: Former   Tobacco comments:    Quit 2020 and restarted 10/2019 1-2 cig/day  States he quit 2 days before procedure 2022  Vaping Use   Vaping Use: Never used  Substance and Sexual Activity   Alcohol use: No    Alcohol/week: 0.0 standard drinks   Drug use: No   Sexual activity: Not on file  Other Topics Concern  Not on file  Social History Narrative   Lives at home with mother   Right handed.   Caffeine two cups of coffee daily.   Social Determinants of Health   Financial Resource Strain: Not on file  Food Insecurity: Not on file  Transportation Needs: Not on file  Physical Activity: Not on file  Stress: Not on file  Social Connections: Not on file  Intimate Partner Violence: Not on file     Current Outpatient Medications:    famotidine (PEPCID) 20 MG tablet, Take 1 tablet (20 mg total) by mouth in the morning and at bedtime. Take one tablet every 12 hours as needed., Disp: 60 tablet, Rfl: 3   fluticasone furoate-vilanterol (BREO ELLIPTA) 100-25 MCG/INH AEPB, Inhale 1 puff into the lungs daily., Disp: 1 each, Rfl: 11   Vitamin D, Ergocalciferol, (DRISDOL) 1.25 MG (50000  UNIT) CAPS capsule, Take 50,000 Units by mouth every Wednesday., Disp: , Rfl:    diazepam (VALIUM) 5 MG tablet, Take 1 tablet (5 mg total) by mouth every 12 (twelve) hours as needed for anxiety., Disp: 45 tablet, Rfl: 1   propranolol (INDERAL) 10 MG tablet, Take 1 tablet (10 mg total) by mouth 2 (two) times daily as needed (for palpitations)., Disp: 60 tablet, Rfl: 2   Allergies  Allergen Reactions   Augmentin [Amoxicillin-Pot Clavulanate] Anaphylaxis    Liver failure    Atorvastatin     Muscle and joint pain   Nsaids Other (See Comments)    Chest pain   Tolmetin Other (See Comments)    Chest pain   Meloxicam Other (See Comments)    Chest pain    ROS Review of Systems  Constitutional: Negative.   HENT: Negative.    Respiratory: Negative.    Cardiovascular: Negative.   Musculoskeletal: Negative.   Psychiatric/Behavioral:  Negative for self-injury, sleep disturbance and suicidal ideas. The patient is nervous/anxious.     OBJECTIVE:    Physical Exam HENT:     Right Ear: Tympanic membrane normal.  Cardiovascular:     Rate and Rhythm: Normal rate.  Skin:    General: Skin is warm.  Neurological:     Mental Status: He is alert.  Psychiatric:        Mood and Affect: Mood normal.    BP 134/83   Pulse 74   Ht '5\' 11"'$  (1.803 m)   Wt 232 lb 12.8 oz (105.6 kg)   BMI 32.47 kg/m  Wt Readings from Last 3 Encounters:  04/09/21 231 lb (104.8 kg)  04/04/21 232 lb 12.8 oz (105.6 kg)  03/18/21 229 lb (103.9 kg)    Health Maintenance Due  Topic Date Due   COVID-19 Vaccine (1) Never done   TETANUS/TDAP  Never done   Zoster Vaccines- Shingrix (1 of 2) Never done   INFLUENZA VACCINE  04/14/2021    There are no preventive care reminders to display for this patient.  CBC Latest Ref Rng & Units 01/08/2021 11/08/2020 11/05/2020  WBC 4.0 - 10.5 K/uL 11.5(H) 10.0 9.1  Hemoglobin 13.0 - 17.0 g/dL 15.2 15.5 14.5  Hematocrit 39.0 - 52.0 % 45.2 46.5 43.7  Platelets 150 - 400 K/uL 168 195  170   CMP Latest Ref Rng & Units 01/08/2021 11/08/2020 07/23/2020  Glucose 70 - 99 mg/dL 86 99 88  BUN 6 - 20 mg/dL '15 15 14  '$ Creatinine 0.61 - 1.24 mg/dL 1.24 1.00 1.01  Sodium 135 - 145 mmol/L 137 137 140  Potassium 3.5 - 5.1 mmol/L  4.3 4.6 4.2  Chloride 98 - 111 mmol/L 102 106 103  CO2 22 - 32 mmol/L '27 23 25  '$ Calcium 8.9 - 10.3 mg/dL 9.3 9.8 9.1  Total Protein 6.5 - 8.1 g/dL 7.3 7.6 7.4  Total Bilirubin 0.3 - 1.2 mg/dL 0.2(L) 0.5 0.4  Alkaline Phos 38 - 126 U/L 66 69 76  AST 15 - 41 U/L '15 20 20  '$ ALT 0 - 44 U/L '19 26 27    '$ Lab Results  Component Value Date   TSH 1.670 04/12/2019   Lab Results  Component Value Date   ALBUMIN 4.5 01/08/2021   ANIONGAP 8 01/08/2021   Lab Results  Component Value Date   CHOL 193 08/20/2020   HDL 44 08/20/2020   LDLCALC 130 (H) 08/20/2020   CHOLHDL 4.4 08/20/2020   Lab Results  Component Value Date   TRIG 96 08/20/2020   Lab Results  Component Value Date   HGBA1C 5.7 (H) 04/12/2019      ASSESSMENT & PLAN:   Problem List Items Addressed This Visit       Other   Anxiety - Primary    Patient doing well on meds, He is taking meds as rx and trying to get back in with Psychiatry.   Plan Refil meds.        Relevant Medications   diazepam (VALIUM) 5 MG tablet    Meds ordered this encounter  Medications   diazepam (VALIUM) 5 MG tablet    Sig: Take 1 tablet (5 mg total) by mouth every 12 (twelve) hours as needed for anxiety.    Dispense:  45 tablet    Refill:  1      Follow-up: No follow-ups on file.    Beckie Salts, [place] [address], [place], [place] [address]

## 2021-04-04 NOTE — Assessment & Plan Note (Signed)
Patient doing well on meds, He is taking meds as rx and trying to get back in with Psychiatry.   Plan Refil meds.

## 2021-04-07 LAB — HELICOBACTER PYLORI  SPECIAL ANTIGEN
MICRO NUMBER:: 12130842
SPECIMEN QUALITY: ADEQUATE

## 2021-04-07 LAB — PANCREATIC ELASTASE, FECAL: Pancreatic Elastase-1, Stool: >500 mcg/g

## 2021-04-08 NOTE — Progress Notes (Signed)
Adam Holloway, your stool tests were negative for H. Pylori (bacteria in stomach) and pancreatic insufficiency.  Have your symptoms improved at all with a gluten free diet?

## 2021-04-08 NOTE — Progress Notes (Signed)
Electrophysiology Office Follow up Visit Note:    Date:  04/09/2021   ID:  Adam Holloway, DOB 1971-05-04, MRN [id number]  PCP:  [name], MD  [facility] Cardiologist:  Kate Sable, MD  [facility] Electrophysiologist:  Vickie Epley, MD    Interval History:    Adam Holloway is a 50 y.o. male who presents for a follow up visit. They were last seen in clinic December 24, 2020.  He had a successful typical atrial flutter ablation on November 11, 2020.  After the ablation he has had trouble with palpitations that seem to be related to an atrial tachycardia or sinus tachycardia.  I implanted a loop recorder after his flutter ablation and he has had no recurrent atrial flutter or atrial fibrillation.  He is not currently anticoagulated.  He has been started on Valium for anxiety which seems to be contributing to his symptoms.  He has had no obvious reentrant arrhythmias on loop recorder tracings since implant.  I have increased the sensitivity of the device to detect any atrial tachycardias and the device has shown several episodes of an elevated heart rate in the 110s to 130s that are all during the awake.  Did not appear to be sinus tachycardia or an ectopic atrial rhythm.  Today he tells me very clearly that he experiences symptoms most of the day every day.  He feels a vague shortness of breath sensation.  He feels strong heartbeats.  He remains active although he is not currently working.  He does have a garden that he tends to.  No syncope.  No sustained arrhythmias to suggest atrial flutter.  He tells me that the Valium that his primary prescribed has helped somewhat.  He tells me that stressors such as seeing his sister precipitate the symptoms.  He also tells me that he is teased out that certain foods seem to trigger worse symptoms.  Past Medical History:  Diagnosis Date   Arthritis    back, left wrist, left knee   Complication of anesthesia    bp dropped during  gallbladder surgery in 2008   Depression    Family history of adverse reaction to anesthesia    mom stopped breathing during colonscopy   GERD (gastroesophageal reflux disease)    h/o   HA (headache)    h/o migrianes   Herniated disc, cervical    History of kidney stones    h/o stones   History of leukocytosis    Lumbago    Pain in soft tissues of limb    Pancreatitis    Peripheral neuropathy 04/12/2019   PTSD (post-traumatic stress disorder)    Scoliosis     Past Surgical History:  Procedure Laterality Date   A-FLUTTER ABLATION N/A 11/11/2020   Procedure: A-FLUTTER ABLATION;  Surgeon: Vickie Epley, MD;  Location: [place] [facility];  Service: Cardiovascular;  Laterality: N/A;   CHOLECYSTECTOMY  2008   COLONOSCOPY  2010   COLONOSCOPY WITH PROPOFOL N/A 06/09/2016   Procedure: COLONOSCOPY WITH PROPOFOL;  Surgeon: Christene Lye, MD;  Location: [facility];  Service: Endoscopy;  Laterality: N/A;   ESOPHAGOGASTRODUODENOSCOPY     FISSURECTOMY N/A 06/29/2016   Procedure: FISSURECTOMY;  Surgeon: Christene Lye, MD;  Location: [facility];  Service: General;  Laterality: N/A;   HEMORRHOID SURGERY N/A 06/29/2016   Procedure: HEMORRHOIDECTOMY;  Surgeon: Christene Lye, MD;  Location: [facility];  Service: General;  Laterality: N/A;   LOOP RECORDER IMPLANT  01/2021  [place] N/A 06/29/2016   Procedure: SPHINCTEROTOMY;  Surgeon: Christene Lye, MD;  Location: [facility];  Service: General;  Laterality: N/A;   WRIST ARTHROSCOPY Left 01/26/2017   Procedure: ARTHROSCOPY LEFT WRIST DEBRIDEMENT;  Surgeon: Daryll Brod, MD;  Location: [place];  Service: Orthopedics;  Laterality: Left;   WRIST SURGERY  2019    Current Medications: Current Meds  Medication Sig   diazepam (VALIUM) 5 MG tablet Take 1 tablet (5 mg total) by mouth every 12 (twelve) hours as needed for anxiety.    famotidine (PEPCID) 20 MG tablet Take 1 tablet (20 mg total) by mouth in the morning and at bedtime. Take one tablet every 12 hours as needed.   fluticasone furoate-vilanterol (BREO ELLIPTA) 100-25 MCG/INH AEPB Inhale 1 puff into the lungs daily.   propranolol (INDERAL) 10 MG tablet Take 1 tablet (10 mg total) by mouth 2 (two) times daily as needed (for palpitations).   Vitamin D, Ergocalciferol, (DRISDOL) 1.25 MG (50000 UNIT) CAPS capsule Take 50,000 Units by mouth every Wednesday.     Allergies:   Augmentin [amoxicillin-pot clavulanate], Atorvastatin, Nsaids, Tolmetin, and Meloxicam   Social History   Socioeconomic History   Marital status: Single    Spouse name: Not on file   Number of children: 1   Years of education: GED   Highest education level: Not on file  Occupational History    Comment: Disabled  Tobacco Use   Smoking status: Former    Packs/day: 0.50    Years: 32.00    Pack years: 16.00    Types: Cigarettes    Quit date: 11/08/2020    Years since quitting: 0.4   Smokeless tobacco: Former   Tobacco comments:    Quit 2020 and restarted 10/2019 1-2 cig/day  States he quit 2 days before procedure 2022  Vaping Use   Vaping Use: Never used  Substance and Sexual Activity   Alcohol use: No    Alcohol/week: 0.0 standard drinks   Drug use: No   Sexual activity: Not on file  Other Topics Concern   Not on file  Social History Narrative   Lives at home with mother   Right handed.   Caffeine two cups of coffee daily.   Social Determinants of Health   Financial Resource Strain: Not on file  Food Insecurity: Not on file  Transportation Needs: Not on file  Physical Activity: Not on file  Stress: Not on file  Social Connections: Not on file     Family History: The patient's family history includes Colon cancer in his mother; Heart Problems in his father; Liver cancer in his brother; Lung cancer in his father.  ROS:   Please see the history of present illness.     All other systems reviewed and are negative.  EKGs/Labs/Other Studies Reviewed:    The following studies were reviewed today:  Prior notes  March 07, 2021 loop recorder interrogation personally reviewed [place] reviewed. No symptom episodes. 4 AT events, longest lasting 4 minutes.Review of rhythm strip suggests probable ST vs AT.       EKG:  The ekg ordered today demonstrates normal sinus rhythm.  Normal intervals.  No preexcitation.  Recent Labs: 01/08/2021: ALT 19; BUN 15; Creatinine, Ser 1.24; Hemoglobin 15.2; Platelets 168; Potassium 4.3; Sodium 137  Recent Lipid Panel    Component Value Date/Time   CHOL 193 08/20/2020 1005   TRIG 96 08/20/2020 1005  HDL 44 08/20/2020 1005   CHOLHDL 4.4 08/20/2020 1005   VLDL 19 08/20/2020 1005   LDLCALC 130 (H) 08/20/2020 1005    Physical Exam:    VS:  BP 120/88 (BP Location: Left Arm, Patient Position: Sitting, Cuff Size: Normal)   Pulse 73   Ht '5\' 11"'$  (1.803 m)   Wt 231 lb (104.8 kg)   SpO2 98%   BMI 32.22 kg/m     Wt Readings from Last 3 Encounters:  04/09/21 231 lb (104.8 kg)  04/04/21 232 lb 12.8 oz (105.6 kg)  03/18/21 229 lb (103.9 kg)     GEN:  Well nourished, well developed in no acute distress HEENT: Normal NECK: No JVD; No carotid bruits LYMPHATICS: No lymphadenopathy CARDIAC: RRR, no murmurs, rubs, gallops RESPIRATORY:  Clear to auscultation without rales, wheezing or rhonchi  ABDOMEN: Soft, non-tender, non-distended MUSCULOSKELETAL:  No edema; No deformity  SKIN: Warm and dry NEUROLOGIC:  Alert and oriented x 3 PSYCHIATRIC:  Normal affect   ASSESSMENT:    1. Palpitations   2. Typical atrial flutter (HCC)    PLAN:    In order of problems listed above:  1. Palpitations Patient continues to have vague complaints of shortness of breath, GI upset, palpitations that occur frequently throughout the day every day.  He thinks there is certain food triggers.  He has a loop recorder in place.  I do not see any  sustained episodes of SVT or atrial flutter.  There is no atrial fibrillation on his monitor.  He does have several episodes of probable AT on the loop recorder.  The rarity of these events make them highly unlikely to be contributing to his symptoms given the frequency of his reported symptoms.  I reviewed his CT scan today during the visit and reassured him that there is no evidence of obstructive coronary artery disease or lung pathology on the CT.  I suspect there is a strong element of anxiety contributing to his symptoms.  I do not think Valium is a good long-term solution.  I discussed with him the importance of following up with his primary care physician to consider prescribing an alternative drug for generalized anxiety.  I offered a low-dose propranolol as needed to be taken when he feels like his heart is racing.  I do not think an antiarrhythmic drug is warranted given no evidence of sustained arrhythmia in the high likelihood of this causing off target effects for Mr. Danh.  2. Typical atrial flutter ([place]) No recurrence after ablation.  No evidence of atrial fibrillation on loop recorder.  Follow-up as needed.  Follow-up with primary care physician for anxiety.   Total time spent with patient today 45 minutes. This includes reviewing records, evaluating the patient and coordinating care.   Medication Adjustments/Labs and Tests Ordered: Current medicines are reviewed at length with the patient today.  Concerns regarding medicines are outlined above.  Orders Placed This Encounter  Procedures   EKG 12-Lead   Meds ordered this encounter  Medications   propranolol (INDERAL) 10 MG tablet    Sig: Take 1 tablet (10 mg total) by mouth 2 (two) times daily as needed (for palpitations).    Dispense:  60 tablet    Refill:  2     Signed, Lars Mage, MD, [facility], [facility] 04/09/2021 11:42 AM    Electrophysiology [facility]

## 2021-04-09 ENCOUNTER — Encounter: Payer: Self-pay | Admitting: Cardiology

## 2021-04-09 ENCOUNTER — Ambulatory Visit (INDEPENDENT_AMBULATORY_CARE_PROVIDER_SITE_OTHER): Payer: Medicare Other | Admitting: Cardiology

## 2021-04-09 ENCOUNTER — Other Ambulatory Visit: Payer: Self-pay

## 2021-04-09 VITALS — BP 120/88 | HR 73 | Ht 71.0 in | Wt 231.0 lb

## 2021-04-09 DIAGNOSIS — I483 Typical atrial flutter: Secondary | ICD-10-CM | POA: Diagnosis not present

## 2021-04-09 DIAGNOSIS — R002 Palpitations: Secondary | ICD-10-CM | POA: Diagnosis not present

## 2021-04-09 MED ORDER — PROPRANOLOL HCL 10 MG PO TABS: 10.0000 mg | ORAL_TABLET | Freq: Two times a day (BID) | ORAL | 2 refills | Status: DC | PRN

## 2021-04-09 NOTE — Patient Instructions (Addendum)
Medication Instructions:  Your physician has recommended you make the following change in your medication:    Take propranolol 10 mg-  Take one tablet by mouth twice a day AS NEEDED for palpitations  *If you need a refill on your cardiac medications before your next appointment, please call your pharmacy*  Lab Work: None ordered. If you have labs (blood work) drawn today and your tests are completely normal, you will receive your results only by: [place] (if you have MyChart) OR A paper copy in the mail If you have any lab test that is abnormal or we need to change your treatment, we will call you to review the results.  Testing/Procedures: None ordered.  Follow-Up: At [facility] you and your health needs are our priority.  As part of our continuing mission to provide you with exceptional heart care, we have created designated Provider Care Teams.  These Care Teams include your primary Cardiologist (physician) and Advanced Practice Providers (APPs -  Physician Assistants and Nurse Practitioners) who all work together to provide you with the care you need, when you need it.  Your next appointment:   Your physician wants you to follow-up in: as needed with Dr. Quentin Ore.    Remote monitoring is used to monitor loop recorder monthly.  Propranolol Tablets What is this medication? PROPRANOLOL (proe PRAN oh lole) is a beta blocker. It decreases the amount of work your heart has to do and helps your heart beat regularly. It treats high blood pressure and/or prevent chest pain (also called angina). It is also usedafter a heart attack to prevent a second one. This medicine may be used for other purposes; ask your health care provider orpharmacist if you have questions. COMMON BRAND NAME(S): Inderal What should I tell my care team before I take this medication? They need to know if you have any of these conditions: circulation problems or blood vessel disease diabetes history of  heart attack or heart disease, vasospastic angina kidney disease liver disease lung or breathing disease, like asthma or emphysema pheochromocytoma slow heart rate thyroid disease an unusual or allergic reaction to propranolol, other beta-blockers, medicines, foods, dyes, or preservatives pregnant or trying to get pregnant breast-feeding How should I use this medication? Take this drug by mouth. Take it as directed on the prescription label at the same time every day. Keep taking it unless your health care provider tells youto stop. Talk to your health care provider about the use of this drug in children.Special care may be needed. Overdosage: If you think you have taken too much of this medicine contact apoison control center or emergency room at once. NOTE: This medicine is only for you. Do not share this medicine with others. What if I miss a dose? If you miss a dose, take it as soon as you can. If it is almost time for yournext dose, take only that dose. Do not take double or extra doses. What may interact with this medication? Do not take this medicine with any of the following medications: feverfew phenothiazines like chlorpromazine, mesoridazine, prochlorperazine, thioridazine This medicine may also interact with the following medications: aluminum hydroxide gel antipyrine antiviral medicines for HIV or AIDS barbiturates like phenobarbital certain medicines for blood pressure, heart disease, irregular heart beat cimetidine ciprofloxacin diazepam fluconazole haloperidol isoniazid medicines for cholesterol like cholestyramine or colestipol medicines for mental depression medicines for migraine headache like almotriptan, eletriptan, frovatriptan, naratriptan, rizatriptan, sumatriptan, zolmitriptan NSAIDs, medicines for pain and inflammation, like ibuprofen or naproxen  phenytoin rifampin teniposide theophylline thyroid medicines tolbutamide warfarin zileuton This list  may not describe all possible interactions. Give your health care provider a list of all the medicines, herbs, non-prescription drugs, or dietary supplements you use. Also tell them if you smoke, drink alcohol, or use illegaldrugs. Some items may interact with your medicine. What should I watch for while using this medication? Visit your doctor or health care professional for regular check ups. Check your blood pressure and pulse rate regularly. Ask your health care professional whatyour blood pressure and pulse rate should be, and when you should contact them. You may get drowsy or dizzy. Do not drive, use machinery, or do anything that needs mental alertness until you know how this drug affects you. Do not stand or sit up quickly, especially if you are an older patient. This reduces the risk of dizzy or fainting spells. Alcohol can make you more drowsy and dizzy.Avoid alcoholic drinks. This medicine may increase blood sugar. Ask your healthcare provider if changesin diet or medicines are needed if you have diabetes. Do not treat yourself for coughs, colds, or pain while you are taking this medicine without asking your doctor or health care professional for advice.Some ingredients may increase your blood pressure. What side effects may I notice from receiving this medication? Side effects that you should report to your doctor or health care professionalas soon as possible: allergic reactions like skin rash, itching or hives, swelling of the face, lips, or tongue breathing problems cold hands or feet difficulty sleeping, nightmares dry peeling skin hallucinations muscle cramps or weakness signs and symptoms of high blood sugar such as being more thirsty or hungry or having to urinate more than normal. You may also feel very tired or have blurry vision. slow heart rate swelling of the legs and ankles vomiting Side effects that usually do not require medical attention (report to yourdoctor or health  care professional if they continue or are bothersome): change in sex drive or performance diarrhea dry sore eyes hair loss nausea weak or tired This list may not describe all possible side effects. Call your doctor for medical advice about side effects. You may report side effects to FDA at1-800-FDA-1088. Where should I keep my medication? Keep out of the reach of children and pets. Store at room temperature between 20 and 25 degrees C (68 and 77 degrees F).Protect from light. Throw away any unused drug after the expiration date. NOTE: This sheet is a summary. It may not cover all possible information. If you have questions about this medicine, talk to your doctor, pharmacist, orhealth care provider.  2022 Elsevier/Gold Standard (2019-04-07 19:25:51)

## 2021-04-10 ENCOUNTER — Ambulatory Visit (INDEPENDENT_AMBULATORY_CARE_PROVIDER_SITE_OTHER): Payer: Medicare Other

## 2021-04-10 DIAGNOSIS — R002 Palpitations: Secondary | ICD-10-CM | POA: Diagnosis not present

## 2021-04-11 LAB — CUP PACEART REMOTE DEVICE CHECK
Date Time Interrogation Session: 20220726171602
Implantable Pulse Generator Implant Date: 20220412

## 2021-05-05 ENCOUNTER — Other Ambulatory Visit: Payer: Self-pay

## 2021-05-05 ENCOUNTER — Encounter: Payer: Self-pay | Admitting: Internal Medicine

## 2021-05-05 ENCOUNTER — Ambulatory Visit (INDEPENDENT_AMBULATORY_CARE_PROVIDER_SITE_OTHER): Payer: Medicare Other | Admitting: Internal Medicine

## 2021-05-05 VITALS — BP 118/81 | HR 74 | Ht 71.0 in | Wt 232.1 lb

## 2021-05-05 DIAGNOSIS — K219 Gastro-esophageal reflux disease without esophagitis: Secondary | ICD-10-CM | POA: Diagnosis not present

## 2021-05-05 DIAGNOSIS — I484 Atypical atrial flutter: Secondary | ICD-10-CM | POA: Diagnosis not present

## 2021-05-05 DIAGNOSIS — G629 Polyneuropathy, unspecified: Secondary | ICD-10-CM

## 2021-05-05 DIAGNOSIS — F419 Anxiety disorder, unspecified: Secondary | ICD-10-CM | POA: Diagnosis not present

## 2021-05-05 DIAGNOSIS — J432 Centrilobular emphysema: Secondary | ICD-10-CM | POA: Diagnosis not present

## 2021-05-05 DIAGNOSIS — I209 Angina pectoris, unspecified: Secondary | ICD-10-CM

## 2021-05-05 DIAGNOSIS — R002 Palpitations: Secondary | ICD-10-CM

## 2021-05-07 NOTE — Progress Notes (Signed)
Carelink Summary Report / Loop Recorder 

## 2021-05-13 ENCOUNTER — Ambulatory Visit (INDEPENDENT_AMBULATORY_CARE_PROVIDER_SITE_OTHER): Payer: Medicare Other

## 2021-05-13 DIAGNOSIS — I483 Typical atrial flutter: Secondary | ICD-10-CM | POA: Diagnosis not present

## 2021-05-13 LAB — CUP PACEART REMOTE DEVICE CHECK
Date Time Interrogation Session: 20220828171818
Implantable Pulse Generator Implant Date: 20220412

## 2021-05-14 ENCOUNTER — Ambulatory Visit: Payer: Medicare Other | Admitting: Gastroenterology

## 2021-05-14 NOTE — Telephone Encounter (Signed)
Successful telephone call to patient to discuss questions regarding recent loop transmission and verbiage "tachy" and "AT". Patient verbalizes a better understanding of loop detection parameters and terminology. He is provided device clinic contact for additional questions or concerns that may arise. States he continues to have intermittent brief palpitations that may be under detected by device. Known anxiety. Reassurance provided. Will continue to monitor loop transmissions.

## 2021-05-19 ENCOUNTER — Encounter: Payer: Self-pay | Admitting: Internal Medicine

## 2021-05-19 NOTE — Assessment & Plan Note (Signed)
Patient is small fiber neuropathy, is referred to neurology

## 2021-05-19 NOTE — Assessment & Plan Note (Signed)
-   I instructed the patient to stop smoking and provided them with smoking cessation materials.  - I informed the patient that smoking puts them at increased risk for cancer, COPD, hypertension, and more.  - Informed the patient to seek help if they begin to have trouble breathing, develop chest pain, start to cough up blood, feel faint, or pass out.  

## 2021-05-19 NOTE — Progress Notes (Signed)
Established Patient Office Visit  Subjective:  Patient ID: Adam Holloway, male    DOB: Dec 04, 1970  Age: 50 y.o. MRN: [id number]  CC:  Chief Complaint  Patient presents with   Follow-up    HPI  Adam Holloway presents for general check  Past Medical History:  Diagnosis Date   Arthritis    back, left wrist, left knee   Complication of anesthesia    bp dropped during gallbladder surgery in 2008   Depression    Family history of adverse reaction to anesthesia    mom stopped breathing during colonscopy   GERD (gastroesophageal reflux disease)    h/o   HA (headache)    h/o migrianes   Herniated disc, cervical    History of kidney stones    h/o stones   History of leukocytosis    Lumbago    Pain in soft tissues of limb    Pancreatitis    Peripheral neuropathy 04/12/2019   PTSD (post-traumatic stress disorder)    Scoliosis     Past Surgical History:  Procedure Laterality Date   A-FLUTTER ABLATION N/A 11/11/2020   Procedure: A-FLUTTER ABLATION;  Surgeon: Vickie Epley, MD;  Location: [place] [facility];  Service: Cardiovascular;  Laterality: N/A;   CHOLECYSTECTOMY  2008   COLONOSCOPY  2010   COLONOSCOPY WITH PROPOFOL N/A 06/09/2016   Procedure: COLONOSCOPY WITH PROPOFOL;  Surgeon: Christene Lye, MD;  Location: [facility];  Service: Endoscopy;  Laterality: N/A;   ESOPHAGOGASTRODUODENOSCOPY     FISSURECTOMY N/A 06/29/2016   Procedure: FISSURECTOMY;  Surgeon: Christene Lye, MD;  Location: [facility];  Service: General;  Laterality: N/A;   HEMORRHOID SURGERY N/A 06/29/2016   Procedure: HEMORRHOIDECTOMY;  Surgeon: Christene Lye, MD;  Location: [facility];  Service: General;  Laterality: N/A;   LOOP RECORDER IMPLANT  01/2021   PAROTIDECTOMY Left [place] N/A 06/29/2016   Procedure: SPHINCTEROTOMY;  Surgeon: Christene Lye, MD;  Location: [facility];  Service: General;  Laterality: N/A;    WRIST ARTHROSCOPY Left 01/26/2017   Procedure: ARTHROSCOPY LEFT WRIST DEBRIDEMENT;  Surgeon: Daryll Brod, MD;  Location: [place];  Service: Orthopedics;  Laterality: Left;   WRIST SURGERY  2019    Family History  Problem Relation Age of Onset   Colon cancer Mother    Heart Problems Father    Lung cancer Father    Liver cancer Brother     Social History   Socioeconomic History   Marital status: Single    Spouse name: Not on file   Number of children: 1   Years of education: GED   Highest education level: Not on file  Occupational History    Comment: Disabled  Tobacco Use   Smoking status: Former    Packs/day: 0.50    Years: 32.00    Pack years: 16.00    Types: Cigarettes    Quit date: 11/08/2020    Years since quitting: 0.5   Smokeless tobacco: Former   Tobacco comments:    Quit 2020 and restarted 10/2019 1-2 cig/day  States he quit 2 days before procedure 2022  Vaping Use   Vaping Use: Never used  Substance and Sexual Activity   Alcohol use: No    Alcohol/week: 0.0 standard drinks   Drug use: No   Sexual activity: Not on file  Other Topics Concern   Not on file  Social History Narrative  Lives at home with mother   Right handed.   Caffeine two cups of coffee daily.   Social Determinants of Health   Financial Resource Strain: Not on file  Food Insecurity: Not on file  Transportation Needs: Not on file  Physical Activity: Not on file  Stress: Not on file  Social Connections: Not on file  Intimate Partner Violence: Not on file     Current Outpatient Medications:    diazepam (VALIUM) 5 MG tablet, Take 1 tablet (5 mg total) by mouth every 12 (twelve) hours as needed for anxiety., Disp: 45 tablet, Rfl: 1   famotidine (PEPCID) 20 MG tablet, Take 1 tablet (20 mg total) by mouth in the morning and at bedtime. Take one tablet every 12 hours as needed., Disp: 60 tablet, Rfl: 3   fluticasone furoate-vilanterol (BREO ELLIPTA) 100-25 MCG/INH AEPB,  Inhale 1 puff into the lungs daily., Disp: 1 each, Rfl: 11   propranolol (INDERAL) 10 MG tablet, Take 1 tablet (10 mg total) by mouth 2 (two) times daily as needed (for palpitations)., Disp: 60 tablet, Rfl: 2   Vitamin D, Ergocalciferol, (DRISDOL) 1.25 MG (50000 UNIT) CAPS capsule, Take 50,000 Units by mouth every Wednesday., Disp: , Rfl:    Allergies  Allergen Reactions   Augmentin [Amoxicillin-Pot Clavulanate] Anaphylaxis    Liver failure    Atorvastatin     Muscle and joint pain   Nsaids Other (See Comments)    Chest pain   Tolmetin Other (See Comments)    Chest pain   Meloxicam Other (See Comments)    Chest pain    ROS Review of Systems  Constitutional: Negative.   HENT: Negative.    Eyes: Negative.   Respiratory: Negative.    Cardiovascular: Negative.   Gastrointestinal: Negative.   Endocrine: Negative.   Genitourinary: Negative.   Musculoskeletal: Negative.   Skin: Negative.   Allergic/Immunologic: Negative.   Neurological: Negative.   Hematological: Negative.   Psychiatric/Behavioral: Negative.    All other systems reviewed and are negative.    Objective:    Physical Exam Vitals reviewed.  Constitutional:      Appearance: Normal appearance.  HENT:     Mouth/Throat:     Mouth: Mucous membranes are moist.  Eyes:     Pupils: Pupils are equal, round, and reactive to light.  Neck:     Vascular: No carotid bruit.  Cardiovascular:     Rate and Rhythm: Normal rate and regular rhythm.     Pulses: Normal pulses.     Heart sounds: Normal heart sounds.  Pulmonary:     Effort: Pulmonary effort is normal.     Breath sounds: Normal breath sounds.  Abdominal:     General: Bowel sounds are normal.     Palpations: Abdomen is soft. There is no hepatomegaly, splenomegaly or mass.     Tenderness: There is no abdominal tenderness.     Hernia: No hernia is present.  Musculoskeletal:     Cervical back: Neck supple.     Right lower leg: No edema.     Left lower leg: No  edema.  Skin:    Findings: No rash.  Neurological:     Mental Status: He is alert and oriented to person, place, and time.     Motor: No weakness.  Psychiatric:        Mood and Affect: Mood normal.        Behavior: Behavior normal.    BP 118/81   Pulse 74  Ht '5\' 11"'$  (1.803 m)   Wt 232 lb 1.6 oz (105.3 kg)   BMI 32.37 kg/m  Wt Readings from Last 3 Encounters:  05/05/21 232 lb 1.6 oz (105.3 kg)  04/09/21 231 lb (104.8 kg)  04/04/21 232 lb 12.8 oz (105.6 kg)     Health Maintenance Due  Topic Date Due   COVID-19 Vaccine (1) Never done   Pneumococcal Vaccine 3-60 Years old (1 - PCV) Never done   TETANUS/TDAP  Never done   Zoster Vaccines- Shingrix (1 of 2) Never done   INFLUENZA VACCINE  04/14/2021    There are no preventive care reminders to display for this patient.  Lab Results  Component Value Date   TSH 1.670 04/12/2019   Lab Results  Component Value Date   WBC 11.5 (H) 01/08/2021   HGB 15.2 01/08/2021   HCT 45.2 01/08/2021   MCV 89.7 01/08/2021   PLT 168 01/08/2021   Lab Results  Component Value Date   NA 137 01/08/2021   K 4.3 01/08/2021   CO2 27 01/08/2021   GLUCOSE 86 01/08/2021   BUN 15 01/08/2021   CREATININE 1.24 01/08/2021   BILITOT 0.2 (L) 01/08/2021   ALKPHOS 66 01/08/2021   AST 15 01/08/2021   ALT 19 01/08/2021   PROT 7.3 01/08/2021   ALBUMIN 4.5 01/08/2021   CALCIUM 9.3 01/08/2021   ANIONGAP 8 01/08/2021   Lab Results  Component Value Date   CHOL 193 08/20/2020   Lab Results  Component Value Date   HDL 44 08/20/2020   Lab Results  Component Value Date   LDLCALC 130 (H) 08/20/2020   Lab Results  Component Value Date   TRIG 96 08/20/2020   Lab Results  Component Value Date   CHOLHDL 4.4 08/20/2020   Lab Results  Component Value Date   HGBA1C 5.7 (H) 04/12/2019      Assessment & Plan:   Problem List Items Addressed This Visit       Cardiovascular and Mediastinum   Atypical atrial flutter (HCC)    Stable at the  present time        Respiratory   Centrilobular emphysema ([place])    - I instructed the patient to stop smoking and provided them with smoking cessation materials.  - I informed the patient that smoking puts them at increased risk for cancer, COPD, hypertension, and more.  - Informed the patient to seek help if they begin to have trouble breathing, develop chest pain, start to cough up blood, feel faint, or pass out.        Digestive   Gastroesophageal reflux disease without esophagitis    - The patient's GERD is stable on medication.  - Instructed the patient to avoid eating spicy and acidic foods, as well as foods high in fat. - Instructed the patient to avoid eating large meals or meals 2-3 hours prior to sleeping.        Nervous and Auditory   Neuropathy    Patient is small fiber neuropathy, is referred to neurology        Other   Anxiety - Primary   Relevant Orders   Ambulatory referral to Psychiatry   Palpitations    Stable at the present time       No orders of the defined types were placed in this encounter. Following recommended to the patient pneumonia shot zoster vaccine from the pharmacy and flu shot this year, patient will be referred to psychiatry  Follow-up: No  follow-ups on file.    [name], MD

## 2021-05-19 NOTE — Assessment & Plan Note (Signed)
-   The patient's GERD is stable on medication.  - Instructed the patient to avoid eating spicy and acidic foods, as well as foods high in fat. - Instructed the patient to avoid eating large meals or meals 2-3 hours prior to sleeping. 

## 2021-05-19 NOTE — Assessment & Plan Note (Signed)
Stable at the present time. 

## 2021-05-20 ENCOUNTER — Ambulatory Visit (INDEPENDENT_AMBULATORY_CARE_PROVIDER_SITE_OTHER): Payer: Medicare Other | Admitting: Gastroenterology

## 2021-05-20 ENCOUNTER — Encounter: Payer: Self-pay | Admitting: Gastroenterology

## 2021-05-20 VITALS — BP 120/70 | HR 72 | Ht 71.0 in | Wt 230.0 lb

## 2021-05-20 DIAGNOSIS — K589 Irritable bowel syndrome without diarrhea: Secondary | ICD-10-CM

## 2021-05-20 DIAGNOSIS — R1012 Left upper quadrant pain: Secondary | ICD-10-CM | POA: Diagnosis not present

## 2021-05-20 DIAGNOSIS — R140 Abdominal distension (gaseous): Secondary | ICD-10-CM | POA: Diagnosis not present

## 2021-05-20 DIAGNOSIS — R002 Palpitations: Secondary | ICD-10-CM | POA: Diagnosis not present

## 2021-05-20 NOTE — Progress Notes (Signed)
HPI : [name] is a pleasant 50 year old male with a history of atrial flutter status post ablation and anxiety who presents for follow-up of multiple GI symptoms.  He was last seen by me on July 5 complaining of symptoms of postprandial abdominal pain and bloating, as well as symptoms of palpitation weakness and shortness of breath after meals.  At that time, we tested for celiac disease and H. pylori as well as a fecal elastase.  These tests were all unremarkable.  I recommended he try a strict gluten-free diet for 4 weeks and recommended that he take a daily fiber supplement to improve his stool bulk and consistency.  I also recommended that he switch from daily Protonix to as needed Pepcid given his infrequent GERD symptoms. Today, the patient states he is doing much better.  His bloating and abdominal pain are much better and he thinks it might have been the Protonix which was causing it.  His bowel movements are very regular now and he is not taking any medications for his bowel movements.  He is eating lots of fiber and prunes and drinking lots of water.  He is still having episodes of tachycardia which seem to be meal related.  His tachycardia is sinus rhythm.  He is on a beta-blocker and he says he is gaining weight which she attributes to the beta-blocker.  Review of his weights in the electronic medical record reveals that his weight has been stable.    Past Medical History:  Diagnosis Date   Arthritis    back, left wrist, left knee   Complication of anesthesia    bp dropped during gallbladder surgery in 2008   Depression    Family history of adverse reaction to anesthesia    mom stopped breathing during colonscopy   GERD (gastroesophageal reflux disease)    h/o   HA (headache)    h/o migrianes   Herniated disc, cervical    History of kidney stones    h/o stones   History of leukocytosis    Lumbago    Pain in soft tissues of limb    Pancreatitis    Peripheral neuropathy  04/12/2019   PTSD (post-traumatic stress disorder)    Scoliosis      Past Surgical History:  Procedure Laterality Date   A-FLUTTER ABLATION N/A 11/11/2020   Procedure: A-FLUTTER ABLATION;  Surgeon: Adam Epley, MD;  Location: [place] [facility];  Service: Cardiovascular;  Laterality: N/A;   CHOLECYSTECTOMY  2008   COLONOSCOPY  2010   COLONOSCOPY WITH PROPOFOL N/A 06/09/2016   Procedure: COLONOSCOPY WITH PROPOFOL;  Surgeon: Adam Lye, MD;  Location: [facility];  Service: Endoscopy;  Laterality: N/A;   ESOPHAGOGASTRODUODENOSCOPY     FISSURECTOMY N/A 06/29/2016   Procedure: FISSURECTOMY;  Surgeon: Adam Lye, MD;  Location: [facility];  Service: General;  Laterality: N/A;   HEMORRHOID SURGERY N/A 06/29/2016   Procedure: HEMORRHOIDECTOMY;  Surgeon: Adam Lye, MD;  Location: [facility];  Service: General;  Laterality: N/A;   LOOP RECORDER IMPLANT  01/2021   PAROTIDECTOMY Left Adam Holloway N/A 06/29/2016   Procedure: SPHINCTEROTOMY;  Surgeon: Adam Lye, MD;  Location: [facility];  Service: General;  Laterality: N/A;   WRIST ARTHROSCOPY Left 01/26/2017   Procedure: ARTHROSCOPY LEFT WRIST DEBRIDEMENT;  Surgeon: Adam Brod, MD;  Location: [place];  Service: Orthopedics;  Laterality: Left;   WRIST SURGERY  2019  Family History  Problem Relation Age of Onset   Colon cancer Mother    Heart Problems Father    Lung cancer Father    Liver cancer Brother    Social History   Tobacco Use   Smoking status: Former    Packs/day: 0.50    Years: 32.00    Pack years: 16.00    Types: Cigarettes    Quit date: 11/08/2020    Years since quitting: 0.5   Smokeless tobacco: Former   Tobacco comments:    Quit 2020 and restarted 10/2019 1-2 cig/day  States he quit 2 days before procedure 2022  Vaping Use   Vaping Use: Never used  Substance Use Topics   Alcohol use: No    Alcohol/week: 0.0  standard drinks   Drug use: No   Current Outpatient Medications  Medication Sig Dispense Refill   diazepam (VALIUM) 5 MG tablet Take 1 tablet (5 mg total) by mouth every 12 (twelve) hours as needed for anxiety. 45 tablet 1   famotidine (PEPCID) 20 MG tablet Take 1 tablet (20 mg total) by mouth in the morning and at bedtime. Take one tablet every 12 hours as needed. 60 tablet 3   fluticasone furoate-vilanterol (BREO ELLIPTA) 100-25 MCG/INH AEPB Inhale 1 puff into the lungs daily. 1 each 11   propranolol (INDERAL) 10 MG tablet Take 1 tablet (10 mg total) by mouth 2 (two) times daily as needed (for palpitations). 60 tablet 2   Vitamin D, Ergocalciferol, (DRISDOL) 1.25 MG (50000 UNIT) CAPS capsule Take 50,000 Units by mouth every Wednesday.     No current facility-administered medications for this visit.   Allergies  Allergen Reactions   Augmentin [Amoxicillin-Pot Clavulanate] Anaphylaxis    Liver failure    Atorvastatin     Muscle and joint pain   Nsaids Other (See Comments)    Chest pain   Tolmetin Other (See Comments)    Chest pain   Meloxicam Other (See Comments)    Chest pain     Review of Systems: All systems reviewed and negative except where noted in HPI.    CUP PACEART REMOTE DEVICE CHECK  Result Date: 05/13/2021 ILR summary report received. Battery status OK. Normal device function. No new symptom, tachy, brady, or pause episodes. No new AF episodes. 1 tachy and 4 AT episodes addressed as an alert. No further episodes. Monthly summary reports and ROV/PRN   Physical Exam: BP 120/70   Pulse 72   Ht '5\' 11"'$  (1.803 m)   Wt 230 lb (104.3 kg)   BMI 32.08 kg/m  Constitutional: Pleasant,well-developed, Caucasian male in no acute distress. HEENT: Normocephalic and atraumatic. Conjunctivae are normal. No scleral icterus. Cardiovascular: Normal rate, regular rhythm.  Pulmonary/chest: Effort normal and breath sounds normal. No wheezing, rales or rhonchi. Abdominal: Soft,  nondistended, nontender. Bowel sounds active throughout. There are no masses palpable. No hepatomegaly. Extremities: no edema Neurological: Alert and oriented to person place and time. Skin: Skin is warm and dry. No rashes noted. Psychiatric: Normal mood and affect. Behavior is normal.  CBC    Component Value Date/Time   WBC 11.5 (H) 01/08/2021 1325   RBC 5.04 01/08/2021 1325   HGB 15.2 01/08/2021 1325   HCT 45.2 01/08/2021 1325   PLT 168 01/08/2021 1325   MCV 89.7 01/08/2021 1325   MCH 30.2 01/08/2021 1325   MCHC 33.6 01/08/2021 1325   RDW 14.4 01/08/2021 1325   LYMPHSABS 3.1 01/08/2021 1325   MONOABS 0.7 01/08/2021 1325  EOSABS 0.2 01/08/2021 1325   BASOSABS 0.0 01/08/2021 1325    CMP     Component Value Date/Time   NA 137 01/08/2021 1435   NA 139 01/19/2019 1129   K 4.3 01/08/2021 1435   CL 102 01/08/2021 1435   CO2 27 01/08/2021 1435   GLUCOSE 86 01/08/2021 1435   BUN 15 01/08/2021 1435   BUN 8 01/19/2019 1129   CREATININE 1.24 01/08/2021 1435   CALCIUM 9.3 01/08/2021 1435   PROT 7.3 01/08/2021 1435   PROT 7.1 04/12/2019 1517   ALBUMIN 4.5 01/08/2021 1435   ALBUMIN 4.5 01/19/2019 1129   AST 15 01/08/2021 1435   ALT 19 01/08/2021 1435   ALKPHOS 66 01/08/2021 1435   BILITOT 0.2 (L) 01/08/2021 1435   BILITOT 0.3 01/19/2019 1129   GFRNONAA >60 01/08/2021 1435   GFRAA >60 05/27/2020 1223     ASSESSMENT AND PLAN: 50 year old male with over a year of bothersome postprandial abdominal pain and bloating which have improved significantly when he stopped taking Protonix and increased his dietary fiber.  His GERD symptoms have not worsened since stopping the PPI.  He still has continued atypical symptoms of palpitations postprandially with his monitor showing sinus tachycardia.  I do not have a good physiologic explanation for this, but query whether this may be anxiety related.  Given the resolution of the majority of his GI symptoms, I do not know that we need to do any  further evaluation at this time.  I recommended he continue doing his increased dietary fiber and prunes and water consumption and continue to avoid Protonix, using Pepcid as needed for GERD symptoms.  Bloating/abdominal pain, resolved - Continue high-fiber diet, prunes and plenty of water - Continue to avoid Protonix which may have been contributing to the symptoms  Adam Royals E. Candis Schatz, MD [facility]  CC: [name], MD

## 2021-05-20 NOTE — Patient Instructions (Signed)
If you are age 52 or older, your body mass index should be between 23-30. Your Body mass index is 32.08 kg/m. If this is out of the aforementioned range listed, please consider follow up with your Primary Care Provider.  If you are age 69 or younger, your body mass index should be between 19-25. Your Body mass index is 32.08 kg/m. If this is out of the aformentioned range listed, please consider follow up with your Primary Care Provider.   Please start Align probiotic over the counter.  The [place] [facility] providers would like to encourage you to use [facility] to communicate with providers for non-urgent requests or questions.  Due to long hold times on the telephone, sending your provider a message by [facility] may be a faster and more efficient way to get a response.  Please allow 48 business hours for a response.  Please remember that this is for non-urgent requests.   It was a pleasure to see you today!  Thank you for trusting me with your gastrointestinal care!    Scott E.Candis Schatz, MD

## 2021-05-23 ENCOUNTER — Ambulatory Visit (INDEPENDENT_AMBULATORY_CARE_PROVIDER_SITE_OTHER): Payer: Medicare Other | Admitting: *Deleted

## 2021-05-23 DIAGNOSIS — Z0000 Encounter for general adult medical examination without abnormal findings: Secondary | ICD-10-CM

## 2021-05-23 NOTE — Progress Notes (Signed)
Subjective:   Adam Holloway is a 50 y.o. male who presents for Medicare Annual/Subsequent preventive examination. Visit performed using audio Patient:home Provider:home  Review of Systems    Defer to provider Cardiac Risk Factors include: none     Objective:    There were no vitals filed for this visit. There is no height or weight on file to calculate BMI.  Advanced Directives 05/23/2021 01/08/2021 11/26/2020 11/11/2020 06/10/2020 05/27/2020 02/20/2019  Does Patient Have a Medical Advance Directive? No No No No No No No  Would patient like information on creating a medical advance directive? No - Patient declined No - Patient declined No - Patient declined No - Patient declined No - Patient declined No - Patient declined No - Patient declined    Current Medications (verified) Outpatient Encounter Medications as of 05/23/2021  Medication Sig   diazepam (VALIUM) 5 MG tablet Take 1 tablet (5 mg total) by mouth every 12 (twelve) hours as needed for anxiety.   famotidine (PEPCID) 20 MG tablet Take 1 tablet (20 mg total) by mouth in the morning and at bedtime. Take one tablet every 12 hours as needed.   fluticasone furoate-vilanterol (BREO ELLIPTA) 100-25 MCG/INH AEPB Inhale 1 puff into the lungs daily.   propranolol (INDERAL) 10 MG tablet Take 1 tablet (10 mg total) by mouth 2 (two) times daily as needed (for palpitations).   Vitamin D, Ergocalciferol, (DRISDOL) 1.25 MG (50000 UNIT) CAPS capsule Take 50,000 Units by mouth every Wednesday.   No facility-administered encounter medications on file as of 05/23/2021.    Allergies (verified) Augmentin [amoxicillin-pot clavulanate], Atorvastatin, Nsaids, Tolmetin, and Meloxicam   History: Past Medical History:  Diagnosis Date   Arthritis    back, left wrist, left knee   Complication of anesthesia    bp dropped during gallbladder surgery in 2008   Depression    Family history of adverse reaction to anesthesia    mom stopped breathing  during colonscopy   GERD (gastroesophageal reflux disease)    h/o   HA (headache)    h/o migrianes   Herniated disc, cervical    History of kidney stones    h/o stones   History of leukocytosis    Lumbago    Pain in soft tissues of limb    Pancreatitis    Peripheral neuropathy 04/12/2019   PTSD (post-traumatic stress disorder)    Scoliosis    Past Surgical History:  Procedure Laterality Date   A-FLUTTER ABLATION N/A 11/11/2020   Procedure: A-FLUTTER ABLATION;  Surgeon: Vickie Epley, MD;  Location: [facility];  Service: Cardiovascular;  Laterality: N/A;   CHOLECYSTECTOMY  2008   COLONOSCOPY  2010   COLONOSCOPY WITH PROPOFOL N/A 06/09/2016   Procedure: COLONOSCOPY WITH PROPOFOL;  Surgeon: Christene Lye, MD;  Location: [facility];  Service: Endoscopy;  Laterality: N/A;   ESOPHAGOGASTRODUODENOSCOPY     FISSURECTOMY N/A 06/29/2016   Procedure: FISSURECTOMY;  Surgeon: Christene Lye, MD;  Location: [facility];  Service: General;  Laterality: N/A;   HEMORRHOID SURGERY N/A 06/29/2016   Procedure: HEMORRHOIDECTOMY;  Surgeon: Christene Lye, MD;  Location: [facility];  Service: General;  Laterality: N/A;   LOOP RECORDER IMPLANT  01/2021   PAROTIDECTOMY Left [place]   SPHINCTEROTOMY N/A 06/29/2016   Procedure: SPHINCTEROTOMY;  Surgeon: Christene Lye, MD;  Location: [facility];  Service: General;  Laterality: N/A;   WRIST ARTHROSCOPY Left 01/26/2017   Procedure: ARTHROSCOPY LEFT  WRIST DEBRIDEMENT;  Surgeon: Daryll Brod, MD;  Location: [facility];  Service: Orthopedics;  Laterality: Left;   WRIST SURGERY  2019   Family History  Problem Relation Age of Onset   Colon cancer Mother    Heart Problems Father    Lung cancer Father    Liver cancer Brother    Social History   Socioeconomic History   Marital status: Single    Spouse name: Not on file   Number of children: 1   Years of education: GED    Highest education level: Not on file  Occupational History    Comment: Disabled  Tobacco Use   Smoking status: Former    Packs/day: 0.50    Years: 32.00    Pack years: 16.00    Types: Cigarettes    Quit date: 11/08/2020    Years since quitting: 0.5   Smokeless tobacco: Former   Tobacco comments:    Quit 2020 and restarted 10/2019 1-2 cig/day  States he quit 2 days before procedure 2022  Vaping Use   Vaping Use: Never used  Substance and Sexual Activity   Alcohol use: No    Alcohol/week: 0.0 standard drinks   Drug use: No   Sexual activity: Not on file  Other Topics Concern   Not on file  Social History Narrative   Lives at home with mother   Right handed.   Caffeine two cups of coffee daily.   Social Determinants of Health   Financial Resource Strain: Low Risk    Difficulty of Paying Living Expenses: Not hard at all  Food Insecurity: No Food Insecurity   Worried About Charity fundraiser in the Last Year: Never true   [place] in the Last Year: Never true  Transportation Needs: No Transportation Needs   Lack of Transportation (Medical): No   Lack of Transportation (Non-Medical): No  Physical Activity: Insufficiently Active   Days of Exercise per Week: 3 days   Minutes of Exercise per Session: 30 min  Stress: No Stress Concern Present   Feeling of Stress : Not at all  Social Connections: Moderately Integrated   Frequency of Communication with Friends and Family: More than three times a week   Frequency of Social Gatherings with Friends and Family: More than three times a week   Attends Religious Services: 1 to 4 times per year   Active Member of [employer] or Organizations: Yes   Attends Archivist Meetings: 1 to 4 times per year   Marital Status: Divorced    Tobacco Counseling Counseling given: Not Answered Tobacco comments: Quit 2020 and restarted 10/2019 1-2 cig/day  States he quit 2 days before procedure 2022   Clinical Intake:  Pre-visit  preparation completed: Yes  Pain : No/denies pain     Nutritional Risks: None Diabetes: No  How often do you need to have someone help you when you read instructions, pamphlets, or other written materials from your doctor or pharmacy?: 1 - Never What is the last grade level you completed in school?: 12  Diabetic?no  Interpreter Needed?: No  Information entered by :: Lacretia Nicks CMA   Activities of Daily Living In your present state of health, do you have any difficulty performing the following activities: 05/23/2021  Hearing? N  Vision? N  Difficulty concentrating or making decisions? N  Walking or climbing stairs? N  Dressing or bathing? N  Doing errands, shopping? N  Preparing Food and eating ? N  Using the Toilet? N  In the past six months, have you accidently leaked urine? N  Do you have problems with loss of bowel control? N  Managing your Medications? N  Managing your Finances? N  Housekeeping or managing your Housekeeping? N  Some recent data might be hidden    Patient Care Team: [name], MD as PCP - General (Unknown Physician Specialty) Kate Sable, MD as PCP - Cardiology (Cardiology) Vickie Epley, MD as PCP - Electrophysiology (Cardiology) Christene Lye, MD (General Surgery) [name], MD (Internal Medicine) Lequita Asal, MD (Inactive) as Referring Physician (Hematology and Oncology)  Indicate any recent Medical Services you may have received from other than [facility] providers in the past year (date may be approximate).     Assessment:   This is a routine wellness examination for Adam Holloway.  Hearing/Vision screen No results found.  Dietary issues and exercise activities discussed:     Goals Addressed   None    Depression Screen PHQ 2/9 Scores 05/23/2021 05/23/2021 04/04/2021 09/25/2016  PHQ - 2 Score 0 0 1 4  PHQ- 9 Score - - - 20    Fall Risk Fall Risk  05/23/2021 05/05/2021 04/04/2021 10/29/2016 09/25/2016  Falls in  the past year? 0 0 0 No No  Number falls in past yr: 0 0 0 - -  Injury with Fall? 0 0 0 - -  Risk for fall due to : No Fall Risks History of fall(s) No Fall Risks - -  Follow up Falls evaluation completed Falls evaluation completed Falls evaluation completed - -    FALL RISK PREVENTION PERTAINING TO THE HOME:  Any stairs in or around the home? No  If so, are there any without handrails? No  Home free of loose throw rugs in walkways, pet beds, electrical cords, etc? Yes  Adequate lighting in your home to reduce risk of falls? Yes   ASSISTIVE DEVICES UTILIZED TO PREVENT FALLS:  Life alert? No  Use of a cane, walker or w/c? No  Grab bars in the bathroom? No  Shower chair or bench in shower? No  Elevated toilet seat or a handicapped toilet? No   TIMED UP AND GO:  Was the test performed? No .  Length of time to ambulate 10 feet: na   Gait steady and fast without use of assistive device  Cognitive Function: MMSE - Mini Mental State Exam 05/23/2021  Orientation to time 5  Orientation to Place 5  Registration 3  Attention/ Calculation 5  Recall 3  Language- name 2 objects 2  Language- repeat 1  Language- follow 3 step command 3  Language- read & follow direction 1  Write a sentence 1  Copy design 0  Total score 29     6CIT Screen 05/23/2021  What Year? 0 points  What month? 0 points  What time? 0 points  Count back from 20 0 points  Months in reverse 0 points  Repeat phrase 0 points  Total Score 0    Immunizations Immunization History  Administered Date(s) Administered   Influenza-Unspecified 05/25/2019    TDAP status: Due, Education has been provided regarding the importance of this vaccine. Advised may receive this vaccine at local pharmacy or Health Dept. Aware to provide a copy of the vaccination record if obtained from local pharmacy or Health Dept. Verbalized acceptance and understanding.  Flu Vaccine status: Due, Education has been provided regarding the  importance of this vaccine. Advised may receive this vaccine at  local pharmacy or Health Dept. Aware to provide a copy of the vaccination record if obtained from local pharmacy or Health Dept. Verbalized acceptance and understanding.  Pneumococcal vaccine status: Declined,  Education has been provided regarding the importance of this vaccine but patient still declined. Advised may receive this vaccine at local pharmacy or Health Dept. Aware to provide a copy of the vaccination record if obtained from local pharmacy or Health Dept. Verbalized acceptance and understanding.   Covid-19 vaccine status: Declined, Education has been provided regarding the importance of this vaccine but patient still declined. Advised may receive this vaccine at local pharmacy or Health Dept.or vaccine clinic. Aware to provide a copy of the vaccination record if obtained from local pharmacy or Health Dept. Verbalized acceptance and understanding.  Qualifies for Shingles Vaccine? No   Zostavax completed No   Shingrix Completed?: No.    Education has been provided regarding the importance of this vaccine. Patient has been advised to call insurance company to determine out of pocket expense if they have not yet received this vaccine. Advised may also receive vaccine at local pharmacy or Health Dept. Verbalized acceptance and understanding.  Screening Tests Health Maintenance  Topic Date Due   COVID-19 Vaccine (1) Never done   Pneumococcal Vaccine 81-30 Years old (1 - PCV) Never done   TETANUS/TDAP  Never done   Zoster Vaccines- Shingrix (1 of 2) Never done   INFLUENZA VACCINE  04/14/2021   COLONOSCOPY (Pts 45-24yr Insurance coverage will need to be confirmed)  06/09/2026   Hepatitis C Screening  Completed   HIV Screening  Completed   HPV VACCINES  Aged Out    Health Maintenance  Health Maintenance Due  Topic Date Due   COVID-19 Vaccine (1) Never done   Pneumococcal Vaccine 155659Years old (1 - PCV) Never done    TETANUS/TDAP  Never done   Zoster Vaccines- Shingrix (1 of 2) Never done   INFLUENZA VACCINE  04/14/2021    Colorectal cancer screening: Type of screening: Colonoscopy. Completed 06/09/2016. Repeat every 10 years  Lung Cancer Screening: (Low Dose CT Chest recommended if Age 11026-80years, 30 pack-year currently smoking OR have quit w/in 15years.) does qualify.   Lung Cancer Screening Referral: patient had CT chest 2020  Additional Screening:  Hepatitis C Screening: does qualify; Has not been Completed   Vision Screening: Recommended annual ophthalmology exams for early detection of glaucoma and other disorders of the eye. Is the patient up to date with their annual eye exam?  Yes  Who is the provider or what is the name of the office in which the patient attends annual eye exams? [place] [facility] If pt is not established with a provider, would they like to be referred to a provider to establish care? No .   Dental Screening: Recommended annual dental exams for proper oral hygiene  Community Resource Referral / Chronic Care Management: CRR required this visit?  No   CCM required this visit?  No      Plan:     I have personally reviewed and noted the following in the patient's chart:   Medical and social history Use of alcohol, tobacco or illicit drugs  Current medications and supplements including opioid prescriptions. Patient is not currently taking opioid prescriptions. Functional ability and status Nutritional status Physical activity Advanced directives List of other physicians Hospitalizations, surgeries, and ER visits in previous 12 months Vitals Screenings to include cognitive, depression, and falls Referrals and appointments  In addition, I have  reviewed and discussed with patient certain preventive protocols, quality metrics, and best practice recommendations. A written personalized care plan for preventive services as well as general preventive health recommendations  were provided to patient.     Lacretia Nicks, [place]   05/23/2021   Nurse Notes:  Mr. Vanwie , Thank you for taking time to come for your Medicare Wellness Visit. I appreciate your ongoing commitment to your health goals. Please review the following plan we discussed and let me know if I can assist you in the future.   These are the goals we discussed:  Goals   None     This is a list of the screening recommended for you and due dates:  Health Maintenance  Topic Date Due   COVID-19 Vaccine (1) Never done   Pneumococcal Vaccination (1 - PCV) Never done   Tetanus Vaccine  Never done   Zoster (Shingles) Vaccine (1 of 2) Never done   Flu Shot  04/14/2021   Colon Cancer Screening  06/09/2026   Hepatitis C Screening: USPSTF Recommendation to screen - Ages 18-79 yo.  Completed   HIV Screening  Completed   HPV Vaccine  Aged Out     Time spent with patient 30 min

## 2021-05-24 NOTE — Progress Notes (Signed)
I have reviewed this visit and agree with the documentation.   

## 2021-05-27 NOTE — Progress Notes (Signed)
Carelink Summary Report / Loop Recorder 

## 2021-06-02 ENCOUNTER — Ambulatory Visit: Payer: Medicare Other | Admitting: Cardiology

## 2021-06-03 ENCOUNTER — Encounter: Payer: Self-pay | Admitting: Cardiology

## 2021-06-16 ENCOUNTER — Ambulatory Visit (INDEPENDENT_AMBULATORY_CARE_PROVIDER_SITE_OTHER): Payer: Medicare Other

## 2021-06-16 DIAGNOSIS — I483 Typical atrial flutter: Secondary | ICD-10-CM

## 2021-06-16 LAB — CUP PACEART REMOTE DEVICE CHECK
Date Time Interrogation Session: 20220930171909
Implantable Pulse Generator Implant Date: 20220412

## 2021-06-17 ENCOUNTER — Other Ambulatory Visit: Payer: Self-pay | Admitting: *Deleted

## 2021-06-17 DIAGNOSIS — R0602 Shortness of breath: Secondary | ICD-10-CM

## 2021-06-18 ENCOUNTER — Ambulatory Visit
Admission: RE | Admit: 2021-06-18 | Discharge: 2021-06-18 | Disposition: A | Discharge status: Home / Self Care | Payer: Medicare Other | Attending: Internal Medicine | Admitting: Internal Medicine

## 2021-06-18 ENCOUNTER — Ambulatory Visit
Admission: RE | Admit: 2021-06-18 | Discharge: 2021-06-18 | Disposition: A | Discharge status: Home / Self Care | Payer: Medicare Other | Source: Ambulatory Visit | Attending: Internal Medicine | Admitting: Internal Medicine

## 2021-06-18 ENCOUNTER — Other Ambulatory Visit: Payer: Self-pay

## 2021-06-18 DIAGNOSIS — R0602 Shortness of breath: Secondary | ICD-10-CM | POA: Diagnosis not present

## 2021-06-22 ENCOUNTER — Other Ambulatory Visit: Payer: Self-pay | Admitting: Gastroenterology

## 2021-06-22 DIAGNOSIS — R12 Heartburn: Secondary | ICD-10-CM

## 2021-06-24 NOTE — Progress Notes (Signed)
Carelink Summary Report / Loop Recorder 

## 2021-07-05 ENCOUNTER — Other Ambulatory Visit: Payer: Self-pay | Admitting: Cardiology

## 2021-07-17 LAB — CUP PACEART REMOTE DEVICE CHECK
Date Time Interrogation Session: 20221102171634
Implantable Pulse Generator Implant Date: 20220412

## 2021-07-21 ENCOUNTER — Ambulatory Visit (INDEPENDENT_AMBULATORY_CARE_PROVIDER_SITE_OTHER): Payer: Medicare Other

## 2021-07-21 DIAGNOSIS — I483 Typical atrial flutter: Secondary | ICD-10-CM | POA: Diagnosis not present

## 2021-07-29 NOTE — Progress Notes (Signed)
Carelink Summary Report / Loop Recorder 

## 2021-07-30 ENCOUNTER — Ambulatory Visit (INDEPENDENT_AMBULATORY_CARE_PROVIDER_SITE_OTHER): Payer: Medicare Other | Admitting: Orthopaedic Surgery

## 2021-07-30 ENCOUNTER — Other Ambulatory Visit: Payer: Self-pay

## 2021-07-30 ENCOUNTER — Ambulatory Visit (INDEPENDENT_AMBULATORY_CARE_PROVIDER_SITE_OTHER): Payer: Medicare Other

## 2021-07-30 ENCOUNTER — Encounter: Payer: Self-pay | Admitting: Orthopaedic Surgery

## 2021-07-30 DIAGNOSIS — M542 Cervicalgia: Secondary | ICD-10-CM

## 2021-07-30 DIAGNOSIS — I209 Angina pectoris, unspecified: Secondary | ICD-10-CM | POA: Diagnosis not present

## 2021-07-30 MED ORDER — HYDROCODONE-ACETAMINOPHEN 5-325 MG PO TABS: 1.0000 | ORAL_TABLET | Freq: Every evening | ORAL | 0 refills | Status: DC | PRN

## 2021-07-30 NOTE — Addendum Note (Signed)
Addended by: Lendon Collar on: 07/30/2021 03:20 PM   Modules accepted: Orders

## 2021-07-30 NOTE — Progress Notes (Signed)
Office Visit Note   Patient: Adam Holloway           Date of Birth: 1970-09-27           MRN: [id number] Visit Date: 07/30/2021              Requested by: [name], MD [address] [place],  [place] [address] PCP: [name], MD   Assessment & Plan: Visit Diagnoses:  1. Neck pain     Plan: [name] is at least a 1 month history of neck pain associated with stiffness.  On occasion he will have some referred pain into 1 or both shoulders.  He is tried some over-the-counter medicines but notes that it "is not working".  X-rays demonstrate some degenerative changes at C4-5 and C5-6 but not much change from films that were performed in 2018.  There is straightening of the normal lordotic curve.  He cannot take prednisone because of his atrial flutter but I will try some hydrocodone so he can sleep and a course of physical therapy.  Like to see him back in a month and if no improvement consider an MRI scan.  I thought neurologically he was intact.  Reflexes were symmetrical  Follow-Up Instructions: Return in about 1 month (around 08/29/2021).   Orders:  Orders Placed This Encounter  Procedures   XR Cervical Spine 2 or 3 views   No orders of the defined types were placed in this encounter.     Procedures: No procedures performed   Clinical Data: No additional findings.   Subjective: Chief Complaint  Patient presents with   Neck - Pain  Patient presents today for neck pain. He has been hurting for a month. No known injury. He said that it is more right sided and does travel down his right arm. He feels "different" in his left pinky and left ring finger. He is right hand dominant. He has had prior x-rays at [facility]. His PCP gave him Flexeril but states that it does not help.  Had a similar problem with the tingling into the ulnar 2 digits of his left hand in the past related to the ulnar nerve at the elbow and with therapy this resolved.  He cannot reproduce the discomfort  with any motion of his neck  HPI  Review of Systems   Objective: Vital Signs: There were no vitals taken for this visit.  Physical Exam Constitutional:      Appearance: He is well-developed.  Pulmonary:     Effort: Pulmonary effort is normal.  Skin:    General: Skin is warm and dry.  Neurological:     Mental Status: He is alert and oriented to person, place, and time.  Psychiatric:        Behavior: Behavior normal.    Ortho Exam neck was quite stiff.  From a neutral position he only was able to extend 20 degrees and rotate his neck to the right and left about the same.  He was having mostly neck pain without any referred pain to either upper extremity.  Reflexes are symmetrical.  Good grip and good release.  No masses but did have some pain along the posterior cervical musculature.  No tenderness over the ulnar nerve at either elbow  Specialty Comments:  No specialty comments available.  Imaging: XR Cervical Spine 2 or 3 views  Result Date: 07/30/2021 Films of the cervical spine were obtained in 2 projections and compared to the films performed in 2018.  There is not much change but some narrowing of the disc space at C4-5 and C5-6.  There is straightening of the normal lordotic curve.  No listhesis or curvature    PMFS History: Patient Active Problem List   Diagnosis Date Noted   Centrilobular emphysema ([place]) 02/21/2021   Typical atrial flutter ([place]) 02/21/2021   Palpitations 12/17/2020   Atypical atrial flutter ([place]) 10/29/2020   Left tennis elbow 07/10/2020   Weight loss 06/10/2020   Vitamin D deficiency 04/11/2020   Gastroesophageal reflux disease without esophagitis 04/11/2020   Anxiety 04/11/2020   Pain in left wrist 12/13/2019   Pain in left elbow 12/13/2019   Neuropathy 04/12/2019   Low vitamin B12 level 02/20/2019   Leukocytosis 02/12/2019   Right-sided chest pain 02/12/2019   Neck pain 12/07/2018   HA (headache)    Pain in soft tissues of limb     Lumbago    Gynecomastia, male 07/12/2013   Past Medical History:  Diagnosis Date   Arthritis    back, left wrist, left knee   Complication of anesthesia    bp dropped during gallbladder surgery in 2008   Depression    Family history of adverse reaction to anesthesia    mom stopped breathing during colonscopy   GERD (gastroesophageal reflux disease)    h/o   HA (headache)    h/o migrianes   Herniated disc, cervical    History of kidney stones    h/o stones   History of leukocytosis    Lumbago    Pain in soft tissues of limb    Pancreatitis    Peripheral neuropathy 04/12/2019   PTSD (post-traumatic stress disorder)    Scoliosis     Family History  Problem Relation Age of Onset   Colon cancer Mother    Heart Problems Father    Lung cancer Father    Liver cancer Brother     Past Surgical History:  Procedure Laterality Date   A-FLUTTER ABLATION N/A 11/11/2020   Procedure: A-FLUTTER ABLATION;  Surgeon: Vickie Epley, MD;  Location: [facility];  Service: Cardiovascular;  Laterality: N/A;   CHOLECYSTECTOMY  2008   COLONOSCOPY  2010   COLONOSCOPY WITH PROPOFOL N/A 06/09/2016   Procedure: COLONOSCOPY WITH PROPOFOL;  Surgeon: Christene Lye, MD;  Location: [facility];  Service: Endoscopy;  Laterality: N/A;   ESOPHAGOGASTRODUODENOSCOPY     FISSURECTOMY N/A 06/29/2016   Procedure: FISSURECTOMY;  Surgeon: Christene Lye, MD;  Location: [facility];  Service: General;  Laterality: N/A;   HEMORRHOID SURGERY N/A 06/29/2016   Procedure: HEMORRHOIDECTOMY;  Surgeon: Christene Lye, MD;  Location: [facility];  Service: General;  Laterality: N/A;   LOOP RECORDER IMPLANT  01/2021   PAROTIDECTOMY Left [place] N/A 06/29/2016   Procedure: SPHINCTEROTOMY;  Surgeon: Christene Lye, MD;  Location: [facility];  Service: General;  Laterality: N/A;   WRIST ARTHROSCOPY Left 01/26/2017   Procedure: ARTHROSCOPY LEFT  WRIST DEBRIDEMENT;  Surgeon: Daryll Brod, MD;  Location: [facility];  Service: Orthopedics;  Laterality: Left;   WRIST SURGERY  2019   Social History   Occupational History    Comment: Disabled  Tobacco Use   Smoking status: Former    Packs/day: 0.50    Years: 32.00    Pack years: 16.00    Types: Cigarettes    Quit date: 11/08/2020    Years since quitting: 0.7   Smokeless tobacco:  Former   Tobacco comments:    Quit 2020 and restarted 10/2019 1-2 cig/day  States he quit 2 days before procedure 2022  Vaping Use   Vaping Use: Never used  Substance and Sexual Activity   Alcohol use: No    Alcohol/week: 0.0 standard drinks   Drug use: No   Sexual activity: Not on file

## 2021-07-31 ENCOUNTER — Encounter: Payer: Self-pay | Admitting: Orthopaedic Surgery

## 2021-08-11 ENCOUNTER — Ambulatory Visit (INDEPENDENT_AMBULATORY_CARE_PROVIDER_SITE_OTHER): Payer: Medicare Other | Admitting: Gastroenterology

## 2021-08-11 ENCOUNTER — Encounter: Payer: Self-pay | Admitting: Gastroenterology

## 2021-08-11 VITALS — BP 118/80 | HR 94 | Ht 71.0 in | Wt 230.0 lb

## 2021-08-11 DIAGNOSIS — R1310 Dysphagia, unspecified: Secondary | ICD-10-CM

## 2021-08-11 NOTE — Patient Instructions (Signed)
If you are age 52 or older, your body mass index should be between 23-30. Your Body mass index is 32.08 kg/m. If this is out of the aforementioned range listed, please consider follow up with your Primary Care Provider.  If you are age 69 or younger, your body mass index should be between 19-25. Your Body mass index is 32.08 kg/m. If this is out of the aformentioned range listed, please consider follow up with your Primary Care Provider.   You have been scheduled for a Barium Esophogram at [facility] (1st floor of the hospital) on 08/15/21 at [place]. Please arrive 15 minutes prior to your appointment for registration. Make certain not to have anything to eat or drink 3 hours prior to your test. If you need to reschedule for any reason, please contact radiology at [phone] to do so. __________________________________________________________________ A barium swallow is an examination that concentrates on views of the esophagus. This tends to be a double contrast exam (barium and two liquids which, when combined, create a gas to distend the wall of the oesophagus) or single contrast (non-ionic iodine based). The study is usually tailored to your symptoms so a good history is essential. Attention is paid during the study to the form, structure and configuration of the esophagus, looking for functional disorders (such as aspiration, dysphagia, achalasia, motility and reflux) EXAMINATION You may be asked to change into a gown, depending on the type of swallow being performed. A radiologist and radiographer will perform the procedure. The radiologist will advise you of the type of contrast selected for your procedure and direct you during the exam. You will be asked to stand, sit or lie in several different positions and to hold a small amount of fluid in your mouth before being asked to swallow while the imaging is performed .In some instances you may be asked to swallow barium coated marshmallows to  assess the motility of a solid food bolus. The exam can be recorded as a digital or video fluoroscopy procedure. POST PROCEDURE It will take 1-2 days for the barium to pass through your system. To facilitate this, it is important, unless otherwise directed, to increase your fluids for the next 24-48hrs and to resume your normal diet.  This test typically takes about 30 minutes to perform.  The [facility] providers would like to encourage you to use [facility] to communicate with providers for non-urgent requests or questions.  Due to long hold times on the telephone, sending your provider a message by [facility] may be a faster and more efficient way to get a response.  Please allow 48 business hours for a response.  Please remember that this is for non-urgent requests.   It was a pleasure to see you today!  Thank you for trusting me with your gastrointestinal care!    Scott E. Candis Schatz, MD

## 2021-08-11 NOTE — Progress Notes (Signed)
HPI : [name] is a pleasant 50 year old male with history of a-flutter and PTSD who have seen previously for symptoms of bloating, GERD and abdominal discomfort who presents today with vague symptoms of dysphagia.  He states the symptoms started about 3 weeks ago, and feels like food is sitting in his throat when he swallows.  He reports having a pressure sensation in his chest with associated discomfort.  He has not had any episodes of having to forcefully vomit back up stuck food.  He describes the sensation as a pulling sensation in his chest when he swallows.  He is also having recurrence of postprandial abdominal pressure and bloating with early satiety.  His typical GERD symptoms continue to be well controlled with Pepcid.  His bowel movements have been regular, with a formed bowel movement about once a day.  His weight is stable.  No blood in the stool. He had an upper endoscopy in April of last year to evaluate upper GI symptoms.  This study was unremarkable. He has been struggling with neck pain recently, has been taking Tylenol.  He denies taking any NSAIDs.  He was prescribed hydrocodone but he has not been taking them.     Past Medical History:  Diagnosis Date   Arthritis    back, left wrist, left knee   Complication of anesthesia    bp dropped during gallbladder surgery in 2008   Depression    Family history of adverse reaction to anesthesia    mom stopped breathing during colonscopy   GERD (gastroesophageal reflux disease)    h/o   HA (headache)    h/o migrianes   Herniated disc, cervical    History of kidney stones    h/o stones   History of leukocytosis    Lumbago    Pain in soft tissues of limb    Pancreatitis    Peripheral neuropathy 04/12/2019   PTSD (post-traumatic stress disorder)    Scoliosis      Past Surgical History:  Procedure Laterality Date   A-FLUTTER ABLATION N/A 11/11/2020   Procedure: A-FLUTTER ABLATION;  Surgeon: Vickie Epley, MD;   Location: [facility];  Service: Cardiovascular;  Laterality: N/A;   CHOLECYSTECTOMY  2008   COLONOSCOPY  2010   COLONOSCOPY WITH PROPOFOL N/A 06/09/2016   Procedure: COLONOSCOPY WITH PROPOFOL;  Surgeon: Christene Lye, MD;  Location: [facility];  Service: Endoscopy;  Laterality: N/A;   ESOPHAGOGASTRODUODENOSCOPY     FISSURECTOMY N/A 06/29/2016   Procedure: FISSURECTOMY;  Surgeon: Christene Lye, MD;  Location: [facility];  Service: General;  Laterality: N/A;   HEMORRHOID SURGERY N/A 06/29/2016   Procedure: HEMORRHOIDECTOMY;  Surgeon: Christene Lye, MD;  Location: [facility];  Service: General;  Laterality: N/A;   LOOP RECORDER IMPLANT  01/2021   PAROTIDECTOMY Left [place] N/A 06/29/2016   Procedure: SPHINCTEROTOMY;  Surgeon: Christene Lye, MD;  Location: [facility];  Service: General;  Laterality: N/A;   WRIST ARTHROSCOPY Left 01/26/2017   Procedure: ARTHROSCOPY LEFT WRIST DEBRIDEMENT;  Surgeon: Daryll Brod, MD;  Location: [facility];  Service: Orthopedics;  Laterality: Left;   WRIST SURGERY  2019   Family History  Problem Relation Age of Onset   Colon cancer Mother    Heart Problems Father    Lung cancer Father    Liver cancer Brother    Social History   Tobacco Use  Smoking status: Former    Packs/day: 0.50    Years: 32.00    Pack years: 16.00    Types: Cigarettes    Quit date: 11/08/2020    Years since quitting: 0.7   Smokeless tobacco: Former   Tobacco comments:    Quit 2020 and restarted 10/2019 1-2 cig/day  States he quit 2 days before procedure 2022  Vaping Use   Vaping Use: Never used  Substance Use Topics   Alcohol use: No    Alcohol/week: 0.0 standard drinks   Drug use: No   Current Outpatient Medications  Medication Sig Dispense Refill   famotidine (PEPCID) 20 MG tablet TAKE 1 TABLET BY MOUTH IN THE MORNING AND AT BEDTIME. TAKE ONE TABLET EVERY 12 HOURS AS  NEEDED. 180 tablet 1   HYDROcodone-acetaminophen (NORCO/VICODIN) 5-325 MG tablet Take 1 tablet by mouth at bedtime as needed for moderate pain. 30 tablet 0   propranolol (INDERAL) 10 MG tablet TAKE 1 TABLET (10 MG TOTAL) BY MOUTH 2 (TWO) TIMES DAILY AS NEEDED (FOR PALPITATIONS). 180 tablet 2   Vitamin D, Ergocalciferol, (DRISDOL) 1.25 MG (50000 UNIT) CAPS capsule Take 50,000 Units by mouth every Wednesday.     No current facility-administered medications for this visit.   Allergies  Allergen Reactions   Augmentin [Amoxicillin-Pot Clavulanate] Anaphylaxis    Liver failure    Atorvastatin     Muscle and joint pain   Nsaids Other (See Comments)    Chest pain   Tolmetin Other (See Comments)    Chest pain   Meloxicam Other (See Comments)    Chest pain     Review of Systems: All systems reviewed and negative except where noted in HPI.    CUP PACEART REMOTE DEVICE CHECK  Result Date: 07/17/2021 ILR summary report received. Battery status OK. Normal device function. No new symptom,  brady, or pause episodes. No new AF episodes. Monthly summary reports and ROV/PRN 2 tachy events <76min, HR 150's LR  XR Cervical Spine 2 or 3 views  Result Date: 07/30/2021 Films of the cervical spine were obtained in 2 projections and compared to the films performed in 2018.  There is not much change but some narrowing of the disc space at C4-5 and C5-6.  There is straightening of the normal lordotic curve.  No listhesis or curvature   Physical Exam: BP 118/80   Pulse 94   Ht 5\' 11"  (1.803 m)   Wt 230 lb (104.3 kg)   BMI 32.08 kg/m  Constitutional: Pleasant,well-developed, Caucasian male in no acute distress. HEENT: Normocephalic and atraumatic. Conjunctivae are normal. No scleral icterus. Neck supple, no mass or swelling.  Extremities: no edema Neurological: Alert and oriented to person place and time. Skin: Skin is warm and dry. No rashes noted. Psychiatric: Normal mood and affect. Behavior is  normal.  CBC    Component Value Date/Time   WBC 11.5 (H) 01/08/2021 1325   RBC 5.04 01/08/2021 1325   HGB 15.2 01/08/2021 1325   HCT 45.2 01/08/2021 1325   PLT 168 01/08/2021 1325   MCV 89.7 01/08/2021 1325   MCH 30.2 01/08/2021 1325   MCHC 33.6 01/08/2021 1325   RDW 14.4 01/08/2021 1325   LYMPHSABS 3.1 01/08/2021 1325   MONOABS 0.7 01/08/2021 1325   EOSABS 0.2 01/08/2021 1325   BASOSABS 0.0 01/08/2021 1325    CMP     Component Value Date/Time   NA 137 01/08/2021 1435   NA 139 01/19/2019 1129   K 4.3 01/08/2021 1435  CL 102 01/08/2021 1435   CO2 27 01/08/2021 1435   GLUCOSE 86 01/08/2021 1435   BUN 15 01/08/2021 1435   BUN 8 01/19/2019 1129   CREATININE 1.24 01/08/2021 1435   CALCIUM 9.3 01/08/2021 1435   PROT 7.3 01/08/2021 1435   PROT 7.1 04/12/2019 1517   ALBUMIN 4.5 01/08/2021 1435   ALBUMIN 4.5 01/19/2019 1129   AST 15 01/08/2021 1435   ALT 19 01/08/2021 1435   ALKPHOS 66 01/08/2021 1435   BILITOT 0.2 (L) 01/08/2021 1435   BILITOT 0.3 01/19/2019 1129   GFRNONAA >60 01/08/2021 1435   GFRAA >60 05/27/2020 1223     ASSESSMENT AND PLAN: 50 year old male with multiple chronic GI symptoms, with new onset dysphagia.  His typical GERD symptoms remain well controlled with Pepcid.  He had an upper endoscopy less than 2 years ago which was unremarkable.  I do not think a repeat upper endoscopy is needed at this time, but a barium swallow may give [place] an idea of the presence of normal peristalsis and exclude any major luminal abnormalities.  If this study is unremarkable and his symptoms or not improving over the next month or so, we can consider esophageal manometry.  Dysphagia - Barium swallow (esophagram)  GERD -Continue Pepcid  Adam Holloway E. Candis Schatz, MD [facility]  CC: [name], MD

## 2021-08-12 ENCOUNTER — Encounter: Payer: Self-pay | Admitting: Gastroenterology

## 2021-08-12 ENCOUNTER — Encounter: Payer: Self-pay | Admitting: Cardiology

## 2021-08-13 ENCOUNTER — Telehealth: Payer: Self-pay

## 2021-08-13 NOTE — Telephone Encounter (Deleted)
Spoke with patient, patient stated that he was doing well. Patient stated that he would take the larger bandage off today, voiced understanding of leaving steri-strips on until wound check on 08/21/21. Reviewed lifting restrictions with patient, patient verified wound check appointment on 08/21/21 at 11:20AM.

## 2021-08-13 NOTE — Telephone Encounter (Deleted)
Error- Disregard phone note signed by E. Keiandre Cygan on 08/13/21 at [phone]

## 2021-08-13 NOTE — Addendum Note (Signed)
Addended by: Wanda Plump on: 08/13/2021 11:07 AM   Modules accepted: Orders, Level of Service

## 2021-08-13 NOTE — Telephone Encounter (Signed)
This encounter was created in error - please disregard.

## 2021-08-15 ENCOUNTER — Other Ambulatory Visit: Payer: Self-pay

## 2021-08-15 ENCOUNTER — Ambulatory Visit
Admission: RE | Admit: 2021-08-15 | Discharge: 2021-08-15 | Disposition: A | Discharge status: Home / Self Care | Payer: Medicare Other | Source: Ambulatory Visit | Attending: Gastroenterology | Admitting: Gastroenterology

## 2021-08-15 DIAGNOSIS — R1310 Dysphagia, unspecified: Secondary | ICD-10-CM | POA: Diagnosis present

## 2021-08-15 NOTE — Progress Notes (Signed)
Hi Arshad,  Your barium swallowed showed no problems with swallowing (normal appearing peristalsis, no strictures/stenosis), but did show significant reflux.  Sometimes reflux can cause symptoms of dysphagia (trouble swallowing).  There is no guaranteed way to prevent reflux.  Continue the acid suppressing medication to reduce symptoms and irritation to the esophagus and focus on behavioral modifications to reduce reflux (avoidance of caffeine/coffee, spicy/fried/greasy foods, tomato-based sauces, peppermint, citrus, chocolate, alcohol), continue to try to lose weight, avoid meals within 3 hours of bedtime. Follow up me as needed if the symptoms are not improving

## 2021-08-19 ENCOUNTER — Other Ambulatory Visit: Payer: Self-pay

## 2021-08-19 ENCOUNTER — Ambulatory Visit: Payer: Medicare Other | Attending: Orthopaedic Surgery | Admitting: Physical Therapy

## 2021-08-19 DIAGNOSIS — M542 Cervicalgia: Secondary | ICD-10-CM | POA: Diagnosis not present

## 2021-08-19 NOTE — Therapy (Addendum)
[facility] [address] [place], [place], [address] Phone: [phone]   Fax:  [phone]  Physical Therapy Evaluation  Patient Details  Name: Adam Holloway MRN: [id number] Date of Birth: Feb 01, 1971 Referring Provider (PT): Joni Fears   Encounter Date: 08/19/2021   PT End of Session - 08/19/21 0950     Visit Number 1    Number of Visits 17    Date for PT Re-Evaluation 10/14/20    Authorization Type eval on 08/19/21    Progress Note Due on Visit 10    PT Start Time 8588    PT Stop Time 0935    PT Time Calculation (min) 48 min    Activity Tolerance Patient limited by pain    Behavior During Therapy [facility] for tasks assessed/performed             Past Medical History:  Diagnosis Date   Arthritis    back, left wrist, left knee   Complication of anesthesia    bp dropped during gallbladder surgery in 2008   Depression    Family history of adverse reaction to anesthesia    mom stopped breathing during colonscopy   GERD (gastroesophageal reflux disease)    h/o   HA (headache)    h/o migrianes   Herniated disc, cervical    History of kidney stones    h/o stones   History of leukocytosis    Lumbago    Pain in soft tissues of limb    Pancreatitis    Peripheral neuropathy 04/12/2019   PTSD (post-traumatic stress disorder)    Scoliosis     Past Surgical History:  Procedure Laterality Date   A-FLUTTER ABLATION N/A 11/11/2020   Procedure: A-FLUTTER ABLATION;  Surgeon: Vickie Epley, MD;  Location: [facility];  Service: Cardiovascular;  Laterality: N/A;   CHOLECYSTECTOMY  2008   COLONOSCOPY  2010   COLONOSCOPY WITH PROPOFOL N/A 06/09/2016   Procedure: COLONOSCOPY WITH PROPOFOL;  Surgeon: Christene Lye, MD;  Location: [facility];  Service: Endoscopy;  Laterality: N/A;   ESOPHAGOGASTRODUODENOSCOPY     FISSURECTOMY N/A 06/29/2016   Procedure: FISSURECTOMY;  Surgeon: Christene Lye,  MD;  Location: [facility];  Service: General;  Laterality: N/A;   HEMORRHOID SURGERY N/A 06/29/2016   Procedure: HEMORRHOIDECTOMY;  Surgeon: Christene Lye, MD;  Location: [facility];  Service: General;  Laterality: N/A;   LOOP RECORDER IMPLANT  01/2021   PAROTIDECTOMY Left [place] N/A 06/29/2016   Procedure: SPHINCTEROTOMY;  Surgeon: Christene Lye, MD;  Location: [facility];  Service: General;  Laterality: N/A;   WRIST ARTHROSCOPY Left 01/26/2017   Procedure: ARTHROSCOPY LEFT WRIST DEBRIDEMENT;  Surgeon: Daryll Brod, MD;  Location: [facility];  Service: Orthopedics;  Laterality: Left;   WRIST SURGERY  2019    There were no vitals filed for this visit.    Subjective Assessment - 08/19/21 0931     Subjective Cervical pain w/ radiculopathy    Pertinent History Pt reports cervical pain with radicular symptoms that started about 1 month ago with no MOI. Pain is the worst when looking up. Pt reports radiating symptoms to R scapula, RUE, R hand and inferior occiput - pt unable to describe pattern through UE and hand. He does report improvement in pain and radicular symptoms since initial onset with no radicular symptoms through UE or hand for the past 4-5 days. Pain  is worst in the morning, better in the afternoon/evening; prior to a couple days ago, pain was constant all day. He is currently sleeping in supine with a cervical pillow; he states this is the most comfortable position he can obtain at this time. 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 X-rays reveal "some narrowing of disc space between C4-5 and C5-6" compared to imaging taken in 2018. Pt states he is limited in household activities and is unable to work. His occupation requires a lot of driving.    Limitations Reading;Lifting;House hold activities   long-distance driving   How long can you sit comfortably? unlimited     How long can you stand comfortably? unlimited    How long can you walk comfortably? unlimited    Diagnostic tests x-ray -  reveals some narrowing of disc space    Patient Stated Goals Get at least close to 100% back to activities and no pain. Return to work (a lot of driving).    Currently in Pain? Yes    Pain Score 3     Pain Location Neck    Pain Orientation Right    Pain Descriptors / Indicators Burning;Numbness;Shooting;Tightness;Tingling    Pain Type Acute pain    Pain Radiating Towards RUE, R hand, R scapula, inferior occiput    Pain Onset 1 to 4 weeks ago    Pain Frequency Constant    Aggravating Factors  cervical extension and rightward lateral flexion    Pain Relieving Factors meds, shower/heat, ice, hand massager, supine with cervical pillow    Effect of Pain on Daily Activities pt unable to work and limited in household activities             SUBJECTIVE  Chief complaint: Pt reports cervical pain with radicular symptoms that started about 1 month ago with no MOI. Pain is the worst when looking up. Pt reports radiating symptoms to R scapula, RUE, R hand and inferior occiput - pt unable to describe pattern through UE and hand. He does report improvement in pain and radicular symptoms since initial onset with no radicular symptoms through UE or hand for the past 4-5 days. Pain is worst in the morning, better in the afternoon/evening; prior to a couple days ago, pain was constant all day. He is currently sleeping in supine with a cervical pillow; he states this is the most comfortable position he can obtain at this time. 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 X-rays reveal "some narrowing of disc space between C4-5 and C5-6" compared to imaging taken in 2018. Pt states he is limited in household activities and is unable to work. His occupation requires a lot of driving.  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  Recent memory is intact.  Remote memory is intact.  Attention span and concentration are intact.  Expressive speech is intact.  Patient's fund of knowledge is within normal limits for educational level.  SENSATION: Grossly intact to light touch bilateral UE as determined by testing dermatomes C2-T2 Proprioception and hot/cold testing deferred on this date   MUSCULOSKELETAL: Tremor: None Bulk: Normal Tone: Normal  Posture In sitting, forward head with cervical protraction.  Palpation Hypersensitive to light pressure throughout R cervical paraspinals, upper trap, levator scap, directly over spinous processes C2-T1, base of the skull (soft space directly inferior to inion). Mild tenderness throughout left side.   Strength R/L 5/5 Shoulder flexion (anterior deltoid/pec major/coracobrachialis, axillary n. (C5/6) and musculocutaneous n. (C5-7)) 5/5 Shoulder abduction (deltoid/supraspinatus, axillary/suprascapular n, C5) 5/5 Shoulder external rotation (infraspinatus/teres minor) 5/5 Shoulder internal rotation (subcapularis/lats/pec major) 5/5 Shoulder extension (posterior deltoid, lats, teres major, axillary/thoracodorsal  n.) 5/5 Shoulder horizontal abduction 5*/5 Elbow flexion (biceps brachii, brachialis, brachioradialis, musculoskeletal n, C5/6) 5/5 Elbow extension (triceps, radial n, C7) 5/5 Wrist Extension (C6/7) 5/5 Wrist Flexion (C6/7)  *Pain limited testing of cervical isometrics.  AROM R/L 35 Cervical Flexion 20 Cervical Extension 10/15 Cervical Lateral Flexion 21/24 Cervical Rotation *Indicates pain, overpressure performed unless otherwise indicated  PROM R/L 45 Cervical Flexion 20 Cervical Extension 15/20 Cervical Lateral Flexion 35/32 Cervical Rotation *Pain with OP - right shoulder abduction, extension, ER (measurement not taken). No pain in left.  *Indicates pain, overpressure performed unless otherwise indicated  Repeated Movements Peripheralization of symptoms with repeated cervical retraction.    Muscle Length *pain obtaining testing position Upper Trap: WFL Levator: tightness R   Passive Accessory Intervertebral Motion (PAIVM) Limited ability to assess due to hypersensitivity. Tested only CPA C2-T1 with mild hypermobility. UPA deferred due to pain. T2-T3 generally hypomobile with no pain. Pt reported chronic thoracic pain from T4-T7 (not tested).   Passive Physiological Intervertebral Motion (PPIVM) Deferred this date  Reflex Testing Deferred this date  SPECIAL TESTS Spurlings A (ipsilateral lateral flexion/axial compression): R: Positive L: Negative Spurlings B (ipsilateral lateral flexion/contralateral rotation/axial compression): R: Positive L: Negative Distraction Test: Not done  Hoffman Sign (cervical cord compression): R: Negative L: Negative ULTT Median/Ulnar/Radial: deferred this date      ASSESSMENT Clinical Impression: Pt is a pleasant 50 year-old male referred for neck pain. PT examination reveals deficits in active and passive ROM in all planes. Pt was hypersensitive through all ROM and upon general palpation of the cervical soft tissues, spine and base  of the skull. Mild hypermobility and significant increase in pain with CPA of C2-T1. Cervical retraction reproduced radiating symptoms into the scapular region. Spurling's test was positive when testing the right side. Strength appeared [facility] and equivalent throughout BUE; pain was provoked when testing R bicep and R middle trapezius. Pt is overall limited by pain. He presents with deficits in mobility and range of motion. Pt will benefit from skilled PT services to address above deficits and return to pain-free function within the home and return to work.   PLAN Next Visit: pain modulation, assess cervical distraction, ULTT, provide HEP. HEP: next date       PT Short Term Goals - 08/19/21 1059       PT SHORT TERM GOAL #1   Title Pt will be independent with HEP in order to improve strength, ROM and decrease cervical pain in order to improve pain-free function at home and work.  Time 4    Period Weeks    Status New    Target Date 09/16/21               PT Long Term Goals - 08/19/21 1109       PT LONG TERM GOAL #1   Title Pt will demonstrate decrease in NDI by at least 19% in order to demonstrate clinically significant reduction in disability related to neck injury/pain.    Baseline 08/19/21: (please complete 2nd page)    Time 8    Period Weeks    Status New    Target Date 10/14/21      PT LONG TERM GOAL #2   Title Pt will increase cervical AROM in extension, lateral flexion and rotation by at least 20 degrees in order to demonstrate improved functional ROM for daily tasks such as driving and self-care.    Baseline 08/19/21: E 20*; Lat F 10* (R), 15* (L); Rot 21* (R), 24* (L)    Time 8    Period Weeks    Status New    Target Date 10/14/21      PT LONG TERM GOAL #3   Title Pt will improve FOTO to at least 51 in order to demonstrate significant improvement in function at home.    Baseline 08/19/21: 32    Time 8    Period Weeks    Status New    Target Date 10/14/21       PT LONG TERM GOAL #4   Title Pt will decrease worst neck pain as reported on NPRS by at least 2 points in order to demonstrate clinically significant reduction in cervical pain.    Baseline 08/19/21: 8/10    Time 8    Period Weeks    Status New    Target Date 10/14/21                  Objective measurements completed on examination: See above findings.               Plan - 08/19/21 1054     Clinical Impression Statement Pt is a pleasant 50 year-old male referred for neck pain. PT examination reveals deficits in active and passive ROM in all planes. Pt was hypersensitive through all ROM and upon general palpation of the cervical soft tissues, spine and base of the skull. Mild hypermobility and significant increase in pain with CPA of C2-T1. Cervical retraction reproduced radiating symptoms into the scapular region. Spurling's test was positive when testing the right side. Strength appeared [facility] and equivalent throughout BUE; pain was provoked when testing R bicep and R middle trapezius. Pt is overall limited by pain. He presents with deficits in mobility and range of motion. Pt will benefit from skilled PT services to address above deficits and return to pain-free function within the home and return to work.    Examination-Activity Limitations Carry;Lift;Dressing;Reach Overhead    Examination-Participation Restrictions Meal Prep;Cleaning;Community Activity;Occupation    Stability/Clinical Decision Making Evolving/Moderate complexity    Clinical Decision Making Low    Rehab Potential Excellent    PT Frequency 2x / week    PT Duration 8 weeks    PT Treatment/Interventions ADLs/Self Care Home Management;Aquatic Therapy;Biofeedback;Cryotherapy;Electrical Stimulation;Iontophoresis 4mg /ml Dexamethasone;Moist Heat;Traction;Ultrasound;Parrafin;Fluidtherapy;Contrast Bath;DME Instruction;Functional mobility training;Therapeutic activities;Therapeutic exercise;Neuromuscular  re-education;Patient/family education;Balance training;Manual techniques;Passive range of motion;Dry needling;Energy conservation;Vestibular;Spinal Manipulations;Joint Manipulations    PT Next Visit Plan pain modulation, assess cervical distraction, ULTT, provide HEP    PT Home Exercise Plan next date  Consulted and Agree with Plan of Care Patient             Patient will benefit from skilled therapeutic intervention in order to improve the following deficits and impairments:  Decreased activity tolerance, Decreased range of motion, Impaired UE functional use, Impaired sensation, Improper body mechanics, Pain, Hypermobility  Visit Diagnosis: Cervicalgia     Problem List Patient Active Problem List   Diagnosis Date Noted   Centrilobular emphysema ([place]) 02/21/2021   Typical atrial flutter ([place]) 02/21/2021   Palpitations 12/17/2020   Atypical atrial flutter ([place]) 10/29/2020   Left tennis elbow 07/10/2020   Weight loss 06/10/2020   Vitamin D deficiency 04/11/2020   Gastroesophageal reflux disease without esophagitis 04/11/2020   Anxiety 04/11/2020   Pain in left wrist 12/13/2019   Pain in left elbow 12/13/2019   Neuropathy 04/12/2019   Low vitamin B12 level 02/20/2019   Leukocytosis 02/12/2019   Right-sided chest pain 02/12/2019   Neck pain 12/07/2018   HA (headache)    Pain in soft tissues of limb    Lumbago    Gynecomastia, male 07/12/2013    Patrina Levering PT, DPT   [facility] [address] [place], [place], [address] Phone: [phone]   Fax:  [phone]  Name: Adam Holloway MRN: [id number] Date of Birth: [birth year]

## 2021-08-20 LAB — CUP PACEART REMOTE DEVICE CHECK
Date Time Interrogation Session: 20221205171835
Implantable Pulse Generator Implant Date: 20220412

## 2021-08-21 ENCOUNTER — Other Ambulatory Visit: Payer: Self-pay

## 2021-08-21 ENCOUNTER — Ambulatory Visit: Payer: Medicare Other

## 2021-08-21 DIAGNOSIS — M542 Cervicalgia: Secondary | ICD-10-CM

## 2021-08-21 NOTE — Patient Instructions (Signed)
Access Code: [email or web address] URL: [email or web address] Date: 08/21/2021 Prepared by: Roxana Hires  Exercises Supine Cervical Retraction with Towel - 2 x daily - 7 x weekly - 1 sets - 10 reps - 5s hold Seated Gentle Upper Trapezius Stretch - 2 x daily - 7 x weekly - 3 reps - 45s hold Seated Scapular Retraction - 2 x daily - 7 x weekly - 1 sets - 10 reps - 5s hold

## 2021-08-21 NOTE — Therapy (Signed)
[facility] [address] [place], [place], [address] Phone: [phone]   Fax:  [phone]  Physical Therapy Treatment  Patient Details  Name: LEKEITH WULF MRN: [id number] Date of Birth: 07/13/1971 Referring Provider (PT): Joni Fears   Encounter Date: 08/21/2021   PT End of Session - 08/21/21 1552     Visit Number 2    Number of Visits 17    Date for PT Re-Evaluation 10/14/20    Authorization Type eval on 08/19/21    Progress Note Due on Visit 10    PT Start Time 0845    PT Stop Time 0930    PT Time Calculation (min) 45 min    Activity Tolerance Patient limited by pain    Behavior During Therapy [facility] for tasks assessed/performed             Past Medical History:  Diagnosis Date   Arthritis    back, left wrist, left knee   Complication of anesthesia    bp dropped during gallbladder surgery in 2008   Depression    Family history of adverse reaction to anesthesia    mom stopped breathing during colonscopy   GERD (gastroesophageal reflux disease)    h/o   HA (headache)    h/o migrianes   Herniated disc, cervical    History of kidney stones    h/o stones   History of leukocytosis    Lumbago    Pain in soft tissues of limb    Pancreatitis    Peripheral neuropathy 04/12/2019   PTSD (post-traumatic stress disorder)    Scoliosis     Past Surgical History:  Procedure Laterality Date   A-FLUTTER ABLATION N/A 11/11/2020   Procedure: A-FLUTTER ABLATION;  Surgeon: Vickie Epley, MD;  Location: [facility];  Service: Cardiovascular;  Laterality: N/A;   CHOLECYSTECTOMY  2008   COLONOSCOPY  2010   COLONOSCOPY WITH PROPOFOL N/A 06/09/2016   Procedure: COLONOSCOPY WITH PROPOFOL;  Surgeon: Christene Lye, MD;  Location: [facility];  Service: Endoscopy;  Laterality: N/A;   ESOPHAGOGASTRODUODENOSCOPY     FISSURECTOMY N/A 06/29/2016   Procedure: FISSURECTOMY;  Surgeon: Christene Lye, MD;   Location: [facility];  Service: General;  Laterality: N/A;   HEMORRHOID SURGERY N/A 06/29/2016   Procedure: HEMORRHOIDECTOMY;  Surgeon: Christene Lye, MD;  Location: [facility];  Service: General;  Laterality: N/A;   LOOP RECORDER IMPLANT  01/2021   PAROTIDECTOMY Left [place] N/A 06/29/2016   Procedure: SPHINCTEROTOMY;  Surgeon: Christene Lye, MD;  Location: [facility];  Service: General;  Laterality: N/A;   WRIST ARTHROSCOPY Left 01/26/2017   Procedure: ARTHROSCOPY LEFT WRIST DEBRIDEMENT;  Surgeon: Daryll Brod, MD;  Location: [facility];  Service: Orthopedics;  Laterality: Left;   WRIST SURGERY  2019    There were no vitals filed for this visit.   Subjective Assessment - 08/21/21 0853     Subjective Pt reports that he is doing alright today. Currently rates his neck pain as 3/10. He has noticed improvement in his R hand symptoms over the last week. Otherwise no specific questions/concerns    Pertinent History Pt reports cervical pain with radicular symptoms that started about 1 month ago with no MOI. Pain is the worst when looking up. Pt reports radiating symptoms to R scapula, RUE, R hand and inferior occiput - pt unable to describe pattern through UE and  hand. He does report improvement in pain and radicular symptoms since initial onset with no radicular symptoms through UE or hand for the past 4-5 days. Pain is worst in the morning, better in the afternoon/evening; prior to a couple days ago, pain was constant all day. He is currently sleeping in supine with a cervical pillow; he states this is the most comfortable position he can obtain at this time. 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 X-rays reveal "some narrowing of disc space between C4-5 and C5-6" compared to imaging taken in 2018. Pt states he is limited in household activities and is unable to work. His occupation  requires a lot of driving.    Limitations Reading;Lifting;House hold activities   long-distance driving   How long can you sit comfortably? unlimited    How long can you stand comfortably? unlimited    How long can you walk comfortably? unlimited    Diagnostic tests x-ray -  reveals some narrowing of disc space    Patient Stated Goals Get at least close to 100% back to activities and no pain. Return to work (a lot of driving).    Pain Onset --                TREATMENT   Ther-ex  Supine repeated cervical retractions 3s hold x 10; Seated scapular retraction 5s hold x 10; Seated R upper trap stretch x 30s; HEP issued, reviewed with patient, and handout provided;   Manual Therapy  Interval history obtained; Prone CPA C2-C6, grade II, 30s/bout x 1 bout/level; Prone R UPA C2-C6, grade II, 30s/bout x 1 bout/level; Supine manual traction 15s hold/15s relax repeated 3 times; Supine right upper trap stretch 2 x 30s;   Trigger Point Dry Needling (TDN), unbilled Education performed with patient regarding potential benefit of TDN. Reviewed precautions and risks with patient. Reviewed special precautions/risks over lung fields which include pneumothorax. Reviewed signs and symptoms of pneumothorax and advised pt to go to ER immediately if these symptoms develop advise them of dry needling treatment. Extensive time spent with pt to ensure full understanding of TDN risks. Pt provided verbal consent to treatment.  In prone using clean technique TDN performed to R subocciptials and R upper trap with 2, 0.25 x 40 single needle placements (one in each location) with local twitch response (LTR) during both placements. Pistoning technique utilized.   Pt educated throughout session about proper posture and technique with exercises. Improved exercise technique, movement at target joints, use of target muscles after min to mod verbal, visual, tactile cues.    Patient demonstrates excellent vision  during session today.  Initiated manual techniques for cervical spine including passive accessory mobilizations and manual traction.  Also included right upper trap stretches which were issued for patient to include in his home exercise program.  Additional exercises to add to HEP included cervical retractions and scapular retractions.  Performed trigger point dry needling today, but only performed 2 placements to assess patient's tolerance.  Patient encouraged to start HEP and follow-up as scheduled. Pt will benefit from PT services to address deficits in posture, strength, and pain in order to return to full function at home.                             PT Short Term Goals - 08/19/21 1059       PT SHORT TERM GOAL #1  Title Pt will be independent with HEP in order to improve strength, ROM and decrease cervical pain in order to improve pain-free function at home and work.    Time 4    Period Weeks    Status New    Target Date 09/16/21               PT Long Term Goals - 08/19/21 1109       PT LONG TERM GOAL #1   Title Pt will demonstrate decrease in NDI by at least 19% in order to demonstrate clinically significant reduction in disability related to neck injury/pain.    Baseline 08/19/21: (please complete 2nd page)    Time 8    Period Weeks    Status New    Target Date 10/14/21      PT LONG TERM GOAL #2   Title Pt will increase cervical AROM in extension, lateral flexion and rotation by at least 20 degrees in order to demonstrate improved functional ROM for daily tasks such as driving and self-care.    Baseline 08/19/21: E 20*; Lat F 10* (R), 15* (L); Rot 21* (R), 24* (L)    Time 8    Period Weeks    Status New    Target Date 10/14/21      PT LONG TERM GOAL #3   Title Pt will improve FOTO to at least 51 in order to demonstrate significant improvement in function at home.    Baseline 08/19/21: 32    Time 8    Period Weeks    Status New    Target Date  10/14/21      PT LONG TERM GOAL #4   Title Pt will decrease worst neck pain as reported on NPRS by at least 2 points in order to demonstrate clinically significant reduction in cervical pain.    Baseline 08/19/21: 8/10    Time 8    Period Weeks    Status New    Target Date 10/14/21                   Plan - 08/21/21 1552     Clinical Impression Statement Patient demonstrates excellent vision during session today.  Initiated manual techniques for cervical spine including passive accessory mobilizations and manual traction.  Also included right upper trap stretches which were issued for patient to include in his home exercise program.  Additional exercises to add to HEP included cervical retractions and scapular retractions.  Performed trigger point dry needling today, but only performed 2 placements to assess patient's tolerance.  Patient encouraged to start HEP and follow-up as scheduled. Pt will benefit from PT services to address deficits in posture, strength, and pain in order to return to full function at home.    Examination-Activity Limitations Carry;Lift;Dressing;Reach Overhead    Examination-Participation Restrictions Meal Prep;Cleaning;Community Activity;Occupation    Stability/Clinical Decision Making Evolving/Moderate complexity    Rehab Potential Excellent    PT Frequency 2x / week    PT Duration 8 weeks    PT Treatment/Interventions ADLs/Self Care Home Management;Aquatic Therapy;Biofeedback;Cryotherapy;Electrical Stimulation;Iontophoresis 4mg /ml Dexamethasone;Moist Heat;Traction;Ultrasound;Parrafin;Fluidtherapy;Contrast Bath;DME Instruction;Functional mobility training;Therapeutic activities;Therapeutic exercise;Neuromuscular re-education;Patient/family education;Balance training;Manual techniques;Passive range of motion;Dry needling;Energy conservation;Vestibular;Spinal Manipulations;Joint Manipulations    PT Next Visit Plan pain modulation, ULTT, provide HEP    PT Home  Exercise Plan Access Code: [email or web address]    Consulted and Agree with Plan of Care Patient             Patient will benefit from skilled  therapeutic intervention in order to improve the following deficits and impairments:  Decreased activity tolerance, Decreased range of motion, Impaired UE functional use, Impaired sensation, Improper body mechanics, Pain, Hypermobility  Visit Diagnosis: Cervicalgia     Problem List Patient Active Problem List   Diagnosis Date Noted   Centrilobular emphysema ([place]) 02/21/2021   Typical atrial flutter ([place]) 02/21/2021   Palpitations 12/17/2020   Atypical atrial flutter ([place]) 10/29/2020   Left tennis elbow 07/10/2020   Weight loss 06/10/2020   Vitamin D deficiency 04/11/2020   Gastroesophageal reflux disease without esophagitis 04/11/2020   Anxiety 04/11/2020   Pain in left wrist 12/13/2019   Pain in left elbow 12/13/2019   Neuropathy 04/12/2019   Low vitamin B12 level 02/20/2019   Leukocytosis 02/12/2019   Right-sided chest pain 02/12/2019   Neck pain 12/07/2018   HA (headache)    Pain in soft tissues of limb    Lumbago    Gynecomastia, male 07/12/2013   Phillips Grout PT, DPT, GCS  Celinda Dethlefs, PT 08/21/2021, 3:58 PM  [facility] [address] [place], [place], [address] Phone: [phone]   Fax:  [phone]  Name: CHRISTROPHER GINTZ MRN: [id number] Date of Birth: 04-Apr-1971

## 2021-08-25 ENCOUNTER — Ambulatory Visit (INDEPENDENT_AMBULATORY_CARE_PROVIDER_SITE_OTHER): Payer: Medicare Other

## 2021-08-25 DIAGNOSIS — I483 Typical atrial flutter: Secondary | ICD-10-CM

## 2021-08-26 ENCOUNTER — Ambulatory Visit: Payer: Medicare Other

## 2021-08-28 ENCOUNTER — Ambulatory Visit: Payer: Medicare Other

## 2021-08-28 ENCOUNTER — Other Ambulatory Visit: Payer: Self-pay

## 2021-08-28 DIAGNOSIS — M542 Cervicalgia: Secondary | ICD-10-CM | POA: Diagnosis not present

## 2021-08-28 NOTE — Therapy (Signed)
[facility] [address] [place], [place], [address] Phone: [phone]   Fax:  [phone]  Physical Therapy Treatment  Patient Details  Name: Adam Holloway MRN: [id number] Date of Birth: 1971-01-02 Referring Provider (PT): Joni Fears   Encounter Date: 08/28/2021   PT End of Session - 08/28/21 0852     Visit Number 3    Number of Visits 17    Date for PT Re-Evaluation 10/14/20    Authorization Type eval on 08/19/21    PT Start Time 0847    PT Stop Time 0930    PT Time Calculation (min) 43 min    Activity Tolerance Patient limited by pain    Behavior During Therapy [facility] for tasks assessed/performed             Past Medical History:  Diagnosis Date   Arthritis    back, left wrist, left knee   Complication of anesthesia    bp dropped during gallbladder surgery in 2008   Depression    Family history of adverse reaction to anesthesia    mom stopped breathing during colonscopy   GERD (gastroesophageal reflux disease)    h/o   HA (headache)    h/o migrianes   Herniated disc, cervical    History of kidney stones    h/o stones   History of leukocytosis    Lumbago    Pain in soft tissues of limb    Pancreatitis    Peripheral neuropathy 04/12/2019   PTSD (post-traumatic stress disorder)    Scoliosis     Past Surgical History:  Procedure Laterality Date   A-FLUTTER ABLATION N/A 11/11/2020   Procedure: A-FLUTTER ABLATION;  Surgeon: Vickie Epley, MD;  Location: [facility];  Service: Cardiovascular;  Laterality: N/A;   CHOLECYSTECTOMY  2008   COLONOSCOPY  2010   COLONOSCOPY WITH PROPOFOL N/A 06/09/2016   Procedure: COLONOSCOPY WITH PROPOFOL;  Surgeon: Christene Lye, MD;  Location: [facility];  Service: Endoscopy;  Laterality: N/A;   ESOPHAGOGASTRODUODENOSCOPY     FISSURECTOMY N/A 06/29/2016   Procedure: FISSURECTOMY;  Surgeon: Christene Lye, MD;  Location: [facility];  Service:  General;  Laterality: N/A;   HEMORRHOID SURGERY N/A 06/29/2016   Procedure: HEMORRHOIDECTOMY;  Surgeon: Christene Lye, MD;  Location: [facility];  Service: General;  Laterality: N/A;   LOOP RECORDER IMPLANT  01/2021   PAROTIDECTOMY Left [place] N/A 06/29/2016   Procedure: SPHINCTEROTOMY;  Surgeon: Christene Lye, MD;  Location: [facility];  Service: General;  Laterality: N/A;   WRIST ARTHROSCOPY Left 01/26/2017   Procedure: ARTHROSCOPY LEFT WRIST DEBRIDEMENT;  Surgeon: Daryll Brod, MD;  Location: [facility];  Service: Orthopedics;  Laterality: Left;   WRIST SURGERY  2019    There were no vitals filed for this visit.   Subjective Assessment - 08/28/21 0850     Subjective Pt reports that he is doing well today. Currently rates his neck pain as 2/10. Mild soreness for about 3 hours after last therapy session. Pt feels like the dry needling was very helpful with his neck pain. No specific questions/concerns    Pertinent History Pt reports cervical pain with radicular symptoms that started about 1 month ago with no MOI. Pain is the worst when looking up. Pt reports radiating symptoms to R scapula, RUE, R hand and inferior occiput - pt unable to describe pattern through UE and  hand. He does report improvement in pain and radicular symptoms since initial onset with no radicular symptoms through UE or hand for the past 4-5 days. Pain is worst in the morning, better in the afternoon/evening; prior to a couple days ago, pain was constant all day. He is currently sleeping in supine with a cervical pillow; he states this is the most comfortable position he can obtain at this time. 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 X-rays reveal "some narrowing of disc space between C4-5 and C5-6" compared to imaging taken in 2018. Pt states he is limited in household activities and is unable to work.  His occupation requires a lot of driving.    Limitations Reading;Lifting;House hold activities   long-distance driving   How long can you sit comfortably? unlimited    How long can you stand comfortably? unlimited    How long can you walk comfortably? unlimited    Diagnostic tests x-ray -  reveals some narrowing of disc space    Patient Stated Goals Get at least close to 100% back to activities and no pain. Return to work (a lot of driving).                TREATMENT   Ther-ex  Prone repeated cervical retractions 5s hold x 10; Supine repeated cervical retractions 5s hold x 10; Seated bilateral upper trap stretch x 30s on each side; Seated lateral flexion stretch x 30s on each side; Manually resisted cervical rotation and lateral flexion x 10 each directionbilaterally; HEP updated, reviewed with patient, and handout provided;   Manual Therapy  Interval history obtained; Prone CPA C2-C6, grade II, 30s/bout x 1 bout/level; Prone R UPA C2-C6, grade II, 30s/bout x 1 bout/level; Supine manual traction with belt assist 15s hold/15s relax repeated 3 times;   Trigger Point Dry Needling (TDN), unbilled Education previously performed with patient regarding potential benefit of TDN. Previously reviewed precautions and risks with patient. Previously reviewed special precautions/risks over lung fields which include pneumothorax. Previously reviewed signs and symptoms of pneumothorax and advised pt to go to ER immediately if these symptoms develop and advise them of dry needling treatment. Pt provided verbal consent to treatment.  In prone using clean technique TDN performed to bilateral suboccipitals and bilateral upper trap with 4, 0.30 x 50 single needle placements (one in each location) with local twitch response (LTR) during both placements. Also performed TDN to bilateral splenius cervicis/capitis with 2, 0.30 x 50 single needle placements (one on each side around C3/C4 level). Pistoning  technique utilized during all placements. Pt reports mild soreness afterwards.   Pt educated throughout session about proper posture and technique with exercises. Improved exercise technique, movement at target joints, use of target muscles after min to mod verbal, visual, tactile cues.    Patient demonstrates excellent motivation during session today. Continued manual techniques for cervical spine including passive accessory mobilizations, manual traction, and stretching.  Progressed to concentric cervical strengthening. Repeated trigger point dry needling today as pt has responded well in the past. Additional placements performed today.  Patient encouraged to continue HEP and follow-up as scheduled. He will benefit from PT services to address deficits in posture, strength, and pain in order to return to full function at home.                             PT Short Term Goals - 08/19/21 1059  PT SHORT TERM GOAL #1   Title Pt will be independent with HEP in order to improve strength, ROM and decrease cervical pain in order to improve pain-free function at home and work.    Time 4    Period Weeks    Status New    Target Date 09/16/21               PT Long Term Goals - 08/19/21 1109       PT LONG TERM GOAL #1   Title Pt will demonstrate decrease in NDI by at least 19% in order to demonstrate clinically significant reduction in disability related to neck injury/pain.    Baseline 08/19/21: (please complete 2nd page)    Time 8    Period Weeks    Status New    Target Date 10/14/21      PT LONG TERM GOAL #2   Title Pt will increase cervical AROM in extension, lateral flexion and rotation by at least 20 degrees in order to demonstrate improved functional ROM for daily tasks such as driving and self-care.    Baseline 08/19/21: E 20*; Lat F 10* (R), 15* (L); Rot 21* (R), 24* (L)    Time 8    Period Weeks    Status New    Target Date 10/14/21      PT LONG  TERM GOAL #3   Title Pt will improve FOTO to at least 51 in order to demonstrate significant improvement in function at home.    Baseline 08/19/21: 32    Time 8    Period Weeks    Status New    Target Date 10/14/21      PT LONG TERM GOAL #4   Title Pt will decrease worst neck pain as reported on NPRS by at least 2 points in order to demonstrate clinically significant reduction in cervical pain.    Baseline 08/19/21: 8/10    Time 8    Period Weeks    Status New    Target Date 10/14/21                   Plan - 08/29/21 0851     Clinical Impression Statement Patient demonstrates excellent motivation during session today. Continued manual techniques for cervical spine including passive accessory mobilizations, manual traction, and stretching.  Progressed to concentric cervical strengthening. Repeated trigger point dry needling today as pt has responded well in the past. Additional placements performed today.  Patient encouraged to continue HEP and follow-up as scheduled. He will benefit from PT services to address deficits in posture, strength, and pain in order to return to full function at home.    Examination-Activity Limitations Carry;Lift;Dressing;Reach Overhead    Examination-Participation Restrictions Meal Prep;Cleaning;Community Activity;Occupation    Stability/Clinical Decision Making Evolving/Moderate complexity    Rehab Potential Excellent    PT Frequency 2x / week    PT Duration 8 weeks    PT Treatment/Interventions ADLs/Self Care Home Management;Aquatic Therapy;Biofeedback;Cryotherapy;Electrical Stimulation;Iontophoresis 4mg /ml Dexamethasone;Moist Heat;Traction;Ultrasound;Parrafin;Fluidtherapy;Contrast Bath;DME Instruction;Functional mobility training;Therapeutic activities;Therapeutic exercise;Neuromuscular re-education;Patient/family education;Balance training;Manual techniques;Passive range of motion;Dry needling;Energy conservation;Vestibular;Spinal Manipulations;Joint  Manipulations    PT Next Visit Plan pain modulation, ULTT, provide HEP    PT Home Exercise Plan Access Code: [email or web address]    Consulted and Agree with Plan of Care Patient              Patient will benefit from skilled therapeutic intervention in order to improve the following deficits and impairments:  Decreased activity tolerance, Decreased  range of motion, Impaired UE functional use, Impaired sensation, Improper body mechanics, Pain, Hypermobility  Visit Diagnosis: Cervicalgia     Problem List Patient Active Problem List   Diagnosis Date Noted   Centrilobular emphysema ([place]) 02/21/2021   Typical atrial flutter ([place]) 02/21/2021   Palpitations 12/17/2020   Atypical atrial flutter ([place]) 10/29/2020   Left tennis elbow 07/10/2020   Weight loss 06/10/2020   Vitamin D deficiency 04/11/2020   Gastroesophageal reflux disease without esophagitis 04/11/2020   Anxiety 04/11/2020   Pain in left wrist 12/13/2019   Pain in left elbow 12/13/2019   Neuropathy 04/12/2019   Low vitamin B12 level 02/20/2019   Leukocytosis 02/12/2019   Right-sided chest pain 02/12/2019   Neck pain 12/07/2018   HA (headache)    Pain in soft tissues of limb    Lumbago    Gynecomastia, male 07/12/2013   Phillips Grout PT, DPT, GCS  Selmer Adduci, PT 08/29/2021, 9:02 AM  [facility] [address] [place], [place], [address] Phone: [phone]   Fax:  [phone]  Name: ABDURRAHMAN PETERSHEIM MRN: [id number] Date of Birth: May 11, 1971

## 2021-09-01 ENCOUNTER — Other Ambulatory Visit: Payer: Self-pay | Admitting: Orthopaedic Surgery

## 2021-09-01 MED ORDER — HYDROCODONE-ACETAMINOPHEN 5-325 MG PO TABS: 1.0000 | ORAL_TABLET | Freq: Every evening | ORAL | 0 refills | Status: DC | PRN

## 2021-09-01 NOTE — Telephone Encounter (Signed)
Sent in hydrocodone-if still in that much pain needs an MRI of the cervical spine-please call and schedule

## 2021-09-01 NOTE — Telephone Encounter (Signed)
Tried to call patient. No answer. Left message that his refill has been submitted. Dr.Whitfield would like to order an MRI of his C-Spine, but I wanted to make sure that the patient was in agreement. I ask him to call us back to confirm if he was okay with this.

## 2021-09-02 ENCOUNTER — Encounter: Payer: Self-pay | Admitting: Orthopaedic Surgery

## 2021-09-02 ENCOUNTER — Ambulatory Visit: Payer: Medicare Other

## 2021-09-02 ENCOUNTER — Other Ambulatory Visit: Payer: Self-pay

## 2021-09-02 DIAGNOSIS — M542 Cervicalgia: Secondary | ICD-10-CM

## 2021-09-04 ENCOUNTER — Ambulatory Visit: Payer: Medicare Other

## 2021-09-04 NOTE — Progress Notes (Signed)
Carelink Summary Report / Loop Recorder 

## 2021-09-07 ENCOUNTER — Encounter: Payer: Self-pay | Admitting: Gastroenterology

## 2021-09-29 ENCOUNTER — Ambulatory Visit (INDEPENDENT_AMBULATORY_CARE_PROVIDER_SITE_OTHER): Payer: Medicare Other

## 2021-09-29 DIAGNOSIS — I483 Typical atrial flutter: Secondary | ICD-10-CM | POA: Diagnosis not present

## 2021-09-29 LAB — CUP PACEART REMOTE DEVICE CHECK
Date Time Interrogation Session: 20230115230528
Implantable Pulse Generator Implant Date: 20220412

## 2021-10-01 ENCOUNTER — Encounter: Payer: Self-pay | Admitting: Gastroenterology

## 2021-10-02 ENCOUNTER — Other Ambulatory Visit: Payer: Self-pay

## 2021-10-02 ENCOUNTER — Ambulatory Visit
Admission: RE | Admit: 2021-10-02 | Discharge: 2021-10-02 | Disposition: A | Discharge status: Home / Self Care | Payer: Medicare Other | Source: Ambulatory Visit | Attending: Orthopaedic Surgery | Admitting: Orthopaedic Surgery

## 2021-10-02 DIAGNOSIS — M542 Cervicalgia: Secondary | ICD-10-CM

## 2021-10-03 ENCOUNTER — Encounter: Payer: Self-pay | Admitting: Orthopaedic Surgery

## 2021-10-03 NOTE — Telephone Encounter (Signed)
Please ask Adam Holloway  to call Adam Holloway and discuss the cervical MRI. Might be worthwhile to refer to Adam Holloway for a cervical ESI-thanks

## 2021-10-06 ENCOUNTER — Other Ambulatory Visit: Payer: Self-pay

## 2021-10-06 DIAGNOSIS — M542 Cervicalgia: Secondary | ICD-10-CM

## 2021-10-06 NOTE — Telephone Encounter (Signed)
Spoke with patient ok for referral to Dr. Ernestina Patches for [facility]

## 2021-10-10 NOTE — Progress Notes (Signed)
Carelink Summary Report / Loop Recorder 

## 2021-10-16 ENCOUNTER — Encounter: Payer: Self-pay | Admitting: Orthopaedic Surgery

## 2021-11-03 ENCOUNTER — Ambulatory Visit (INDEPENDENT_AMBULATORY_CARE_PROVIDER_SITE_OTHER): Payer: Medicare Other

## 2021-11-03 DIAGNOSIS — I483 Typical atrial flutter: Secondary | ICD-10-CM | POA: Diagnosis not present

## 2021-11-03 LAB — CUP PACEART REMOTE DEVICE CHECK
Date Time Interrogation Session: 20230219230922
Implantable Pulse Generator Implant Date: 20220412

## 2021-11-10 NOTE — Progress Notes (Signed)
Carelink Summary Report / Loop Recorder 

## 2021-11-18 ENCOUNTER — Telehealth: Payer: Self-pay

## 2021-11-18 NOTE — Telephone Encounter (Signed)
Pt transferred to [facility] ?

## 2021-12-01 ENCOUNTER — Ambulatory Visit (INDEPENDENT_AMBULATORY_CARE_PROVIDER_SITE_OTHER): Payer: Medicare Other | Admitting: Gastroenterology

## 2021-12-01 ENCOUNTER — Encounter: Payer: Self-pay | Admitting: Gastroenterology

## 2021-12-01 VITALS — BP 120/80 | HR 88 | Ht 71.0 in | Wt 233.0 lb

## 2021-12-01 DIAGNOSIS — R140 Abdominal distension (gaseous): Secondary | ICD-10-CM

## 2021-12-01 DIAGNOSIS — K581 Irritable bowel syndrome with constipation: Secondary | ICD-10-CM | POA: Diagnosis not present

## 2021-12-01 MED ORDER — LINACLOTIDE 145 MCG PO CAPS: 145.0000 ug | ORAL_CAPSULE | Freq: Every day | ORAL | 3 refills | Status: AC

## 2021-12-01 NOTE — Patient Instructions (Signed)
If you are age 53 or older, your body mass index should be between 23-30. Your Body mass index is 32.5 kg/m?Marland Kitchen If this is out of the aforementioned range listed, please consider follow up with your Primary Care Provider. ? ?If you are age 27 or younger, your body mass index should be between 19-25. Your Body mass index is 32.5 kg/m?Marland Kitchen If this is out of the aformentioned range listed, please consider follow up with your Primary Care Provider.  ? ?We have sent the following medications to your pharmacy for you to pick up at your convenience: ?Linzess 145 mcg  ? ? ?The [facility] providers would like to encourage you to use [facility] to communicate with providers for non-urgent requests or questions.  Due to long hold times on the telephone, sending your provider a message by [facility] may be a faster and more efficient way to get a response.  Please allow 48 business hours for a response.  Please remember that this is for non-urgent requests.  ? ?It was a pleasure to see you today! ? ?Thank you for trusting me with your gastrointestinal care!   ? ?Scott E.Candis Schatz, MD  ? ?

## 2021-12-01 NOTE — Progress Notes (Signed)
? ?HPI : Adam Holloway is a very pleasant 51 year old male with a history of depression, PTSD and chronic palpitations well-known to me.  He was initially seen by me in July 2022 with postprandial abdominal pain and bloating with associated palpitations, dyspnea and weakness.  In November he presented with symptoms of dysphagia and had a barium swallow which showed significant reflux, but normal esophageal motility and normal passage of barium tablet.  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 ?He went to the emergency department in January with symptoms of significant abdominal pain and bloating.  He was tentatively diagnosed with peptic ulcer disease, based on symptoms alone and treated with Carafate and PPI for 2 weeks. ?Today, the patient reports some mild improvement in his abdominal pain, but he continues to be very bothered by persistent bloating and distention.  He states that he used to have episodic distention, but now his distention seems constant and persistent.  He is also been having worsening constipation recently.  On average she has been having a bowel movement every 3 to 4 days.  His stools are typically poorly formed, not hard or small.  He is gone up to 5 days without a bowel movement recently.  No blood in the stool.  Diarrhea is not a problem for him.  He has tried taking MiraLAX and Metamucil without good effect.  If he takes stimulant laxative such as Dulcolax, it gives him significant diarrhea.  He is prescribed oxycodone for neck pain, but states he only takes it once every 3 to 4 days.  His weight has been overall stable ?He denies any changes in his diet or medications. ? ? ?Past Medical History:  ?Diagnosis Date  ? Arthritis   ? back, left wrist, left knee  ? Complication of anesthesia   ? bp dropped during gallbladder surgery in 2008  ? Depression   ? Family history of adverse reaction to anesthesia   ?  mom stopped breathing during colonscopy  ? GERD (gastroesophageal reflux disease)   ? h/o  ? HA (headache)   ? h/o migrianes  ? Herniated disc, cervical   ? History of kidney stones   ? h/o stones  ? History of leukocytosis   ? Lumbago   ? Pain in soft tissues of limb   ? Pancreatitis   ? Peripheral neuropathy 04/12/2019  ? PTSD (post-traumatic stress disorder)   ? Scoliosis   ? ? ? ?Past Surgical History:  ?Procedure Laterality Date  ? A-FLUTTER ABLATION N/A 11/11/2020  ? Procedure: A-FLUTTER ABLATION;  Surgeon: Vickie Epley, MD;  Location: [facility];  Service: Cardiovascular;  Laterality: N/A;  ? CHOLECYSTECTOMY  2008  ? COLONOSCOPY  2010  ? COLONOSCOPY WITH PROPOFOL N/A 06/09/2016  ? Procedure: COLONOSCOPY WITH PROPOFOL;  Surgeon: Christene Lye, MD;  Location: [facility];  Service: Endoscopy;  Laterality: N/A;  ? ESOPHAGOGASTRODUODENOSCOPY    ? FISSURECTOMY N/A 06/29/2016  ? Procedure: FISSURECTOMY;  Surgeon: Christene Lye, MD;  Location: [facility];  Service: General;  Laterality: N/A;  ? HEMORRHOID SURGERY N/A 06/29/2016  ? Procedure: HEMORRHOIDECTOMY;  Surgeon: Christene Lye, MD;  Location: [facility];  Service: General;  Laterality: N/A;  ? LOOP RECORDER IMPLANT  01/2021  ? PAROTIDECTOMY Left 1997  ? SALIVARY GLAND SURGERY  1997  ? SPHINCTEROTOMY N/A 06/29/2016  ? Procedure: SPHINCTEROTOMY;  Surgeon: Christene Lye, MD;  Location: [facility]  [facility];  Service: General;  Laterality: N/A;  ? WRIST ARTHROSCOPY Left 01/26/2017  ? Procedure: ARTHROSCOPY LEFT WRIST DEBRIDEMENT;  Surgeon: Daryll Brod, MD;  Location: [place];  Service: Orthopedics;  Laterality: Left;  ? WRIST SURGERY  2019  ? ?Family History  ?Problem Relation Age of Onset  ? Colon cancer Mother   ? Heart Problems Father   ? Lung cancer Father   ? Liver cancer Brother   ? ?Social History  ? ?Tobacco Use  ? Smoking status: Some Days  ?  Packs/day: 0.50  ?  Years: 32.00  ?  Pack years: 16.00  ?   Types: Cigarettes  ?  Last attempt to quit: 11/08/2020  ?  Years since quitting: 1.0  ? Smokeless tobacco: Former  ? Tobacco comments:  ?  Quit 2020 and restarted 10/2019 1-2 cig/day  States he quit 2 days before procedure 2022  ?Vaping Use  ? Vaping Use: Never used  ?Substance Use Topics  ? Alcohol use: No  ?  Alcohol/week: 0.0 standard drinks  ? Drug use: No  ? ?Current Outpatient Medications  ?Medication Sig Dispense Refill  ? aspirin 81 MG EC tablet Take 81 mg by mouth daily.    ? cyclobenzaprine (FLEXERIL) 10 MG tablet Take 10 mg by mouth 3 (three) times daily as needed.    ? famotidine (PEPCID) 20 MG tablet TAKE 1 TABLET BY MOUTH IN THE MORNING AND AT BEDTIME. TAKE ONE TABLET EVERY 12 HOURS AS NEEDED. 180 tablet 1  ? magnesium oxide (MAG-OX) 400 MG tablet Take 400 mg by mouth daily.    ? oxyCODONE (OXY IR/ROXICODONE) 5 MG immediate release tablet Take 5 mg by mouth 4 (four) times daily as needed.    ? Vitamin D, Ergocalciferol, (DRISDOL) 1.25 MG (50000 UNIT) CAPS capsule Take 50,000 Units by mouth every Wednesday.    ? ?No current facility-administered medications for this visit.  ? ?Allergies  ?Allergen Reactions  ? Augmentin [Amoxicillin-Pot Clavulanate] Anaphylaxis  ?  Liver failure   ? Atorvastatin   ?  Muscle and joint pain  ? Nsaids Other (See Comments)  ?  Chest pain  ? Tolmetin Other (See Comments)  ?  Chest pain  ? Meloxicam Other (See Comments)  ?  Chest pain  ? ? ? ?Review of Systems: ?All systems reviewed and negative except where noted in HPI.  ? ? ?CUP PACEART REMOTE DEVICE CHECK ? ?Result Date: 11/03/2021 ?ILR summary report received. Battery status OK. Normal device function. No new tachy, brady, or pause episodes. No new AF episodes. Monthly summary reports and ROV/PRN 2 symptom activation's, no associated arrhythmia LA  ? ?Physical Exam: ?BP 120/80   Pulse 88   Ht '5\' 11"'$  (1.803 m)   Wt 233 lb (105.7 kg)   SpO2 91%   BMI 32.50 kg/m?  ?Constitutional: Pleasant,well-developed, Caucasian male  in no acute distress. ?HEENT: Normocephalic and atraumatic. Conjunctivae are normal. No scleral icterus. ?Cardiovascular: Normal rate, regular rhythm.  ?Pulmonary/chest: Effort normal and breath sounds normal. No wheezing, rales or rhonchi. ?Abdominal: Soft, nondistended, nontender. Bowel sounds active throughout. There are no masses palpable. No hepatomegaly. ?Extremities: no edema ?Neurological: Alert and oriented to person place and time. ?Skin: Skin is warm and dry. No rashes noted. ?Psychiatric: Normal mood and affect. Behavior is normal. ? ?CBC ?   ?Component Value Date/Time  ? WBC 11.5 (H) 01/08/2021 1325  ? RBC 5.04 01/08/2021 1325  ? HGB 15.2 01/08/2021 1325  ? HCT 45.2 01/08/2021  1325  ? PLT 168 01/08/2021 1325  ? MCV 89.7 01/08/2021 1325  ? MCH 30.2 01/08/2021 1325  ? MCHC 33.6 01/08/2021 1325  ? RDW 14.4 01/08/2021 1325  ? LYMPHSABS 3.1 01/08/2021 1325  ? MONOABS 0.7 01/08/2021 1325  ? EOSABS 0.2 01/08/2021 1325  ? BASOSABS 0.0 01/08/2021 1325  ? ? ?CMP  ?   ?Component Value Date/Time  ? NA 137 01/08/2021 1435  ? NA 139 01/19/2019 1129  ? K 4.3 01/08/2021 1435  ? CL 102 01/08/2021 1435  ? CO2 27 01/08/2021 1435  ? GLUCOSE 86 01/08/2021 1435  ? BUN 15 01/08/2021 1435  ? BUN 8 01/19/2019 1129  ? CREATININE 1.24 01/08/2021 1435  ? CALCIUM 9.3 01/08/2021 1435  ? PROT 7.3 01/08/2021 1435  ? PROT 7.1 04/12/2019 1517  ? ALBUMIN 4.5 01/08/2021 1435  ? ALBUMIN 4.5 01/19/2019 1129  ? AST 15 01/08/2021 1435  ? ALT 19 01/08/2021 1435  ? ALKPHOS 66 01/08/2021 1435  ? BILITOT 0.2 (L) 01/08/2021 1435  ? BILITOT 0.3 01/19/2019 1129  ? GFRNONAA >60 01/08/2021 1435  ? GFRAA >60 05/27/2020 1223  ? ? ? ?ASSESSMENT AND PLAN: ?51 year old male with anxiety with multiple chronic GI symptoms, predominantly with bloating, abdominal distention and constipation is his most frequent complaints.  He has had a negative evaluation to include H. pylori stool antigen, celiac serologies, upper and lower endoscopy.  He has notable GERD  based on barium swallow results, but typical GERD symptoms are not prominent for him.  History is most consistent with constipation predominant IBS.  He has not had good success with traditional laxative therapies

## 2021-12-02 ENCOUNTER — Encounter: Payer: Self-pay | Admitting: Gastroenterology

## 2022-01-30 ENCOUNTER — Other Ambulatory Visit: Payer: Self-pay

## 2022-01-30 ENCOUNTER — Encounter (HOSPITAL_BASED_OUTPATIENT_CLINIC_OR_DEPARTMENT_OTHER): Payer: Self-pay

## 2022-01-30 ENCOUNTER — Emergency Department (HOSPITAL_BASED_OUTPATIENT_CLINIC_OR_DEPARTMENT_OTHER)
Admission: EM | Admit: 2022-01-30 | Discharge: 2022-01-30 | Disposition: A | Discharge status: Home / Self Care | Payer: Medicare Other | Attending: Emergency Medicine | Admitting: Emergency Medicine

## 2022-01-30 DIAGNOSIS — S6992XA Unspecified injury of left wrist, hand and finger(s), initial encounter: Secondary | ICD-10-CM | POA: Diagnosis present

## 2022-01-30 DIAGNOSIS — W260XXA Contact with knife, initial encounter: Secondary | ICD-10-CM | POA: Diagnosis not present

## 2022-01-30 DIAGNOSIS — S61217A Laceration without foreign body of left little finger without damage to nail, initial encounter: Secondary | ICD-10-CM

## 2022-01-30 DIAGNOSIS — R55 Syncope and collapse: Secondary | ICD-10-CM | POA: Diagnosis not present

## 2022-01-30 DIAGNOSIS — Z7982 Long term (current) use of aspirin: Secondary | ICD-10-CM | POA: Diagnosis not present

## 2022-01-30 NOTE — ED Triage Notes (Signed)
Pt presents to ED C/O laceration to L little finger. Pt reports he may have passed out when he some the blood. Wound is hemostatic at this time.

## 2022-01-30 NOTE — ED Provider Notes (Signed)
[facility] EMERGENCY DEPT Provider Note   CSN: [id number] Arrival date & time: 01/30/22  1633     History  Chief Complaint  Patient presents with   Laceration   Loss of Consciousness    Adam Holloway is a 51 y.o. male presenting to emergency department laceration to his left fifth finger, reports he cut his finger with a knife while he was cutting watermelon today.  He is right-handed typically.  There was significant bleeding at the time but it is since stopped.  He said after That Began to Feel Lightheaded and Briefly Lost Consciousness.  He Has Never Lost Consciousness before.  He feels back to baseline.  He reports he had blood work done about a week ago, showed me the results on his phone from his PCP office, and had a CMP and a CBC which were unremarkable.  HPI     Home Medications Prior to Admission medications   Medication Sig Start Date End Date Taking? Authorizing Provider  aspirin 81 MG EC tablet Take 81 mg by mouth daily.    [provider]  cyclobenzaprine (FLEXERIL) 10 MG tablet Take 10 mg by mouth 3 (three) times daily as needed. 07/15/21   [provider]  famotidine (PEPCID) 20 MG tablet TAKE 1 TABLET BY MOUTH IN THE MORNING AND AT BEDTIME. TAKE ONE TABLET EVERY 12 HOURS AS NEEDED. 06/23/21   Daryel November, MD  linaclotide [facility]) 145 MCG CAPS capsule Take 1 capsule (145 mcg total) by mouth daily before breakfast. 12/01/21   Daryel November, MD  magnesium oxide (MAG-OX) 400 MG tablet Take 400 mg by mouth daily.    [provider]  oxyCODONE (OXY IR/ROXICODONE) 5 MG immediate release tablet Take 5 mg by mouth 4 (four) times daily as needed. 10/24/21   [provider]  Vitamin D, Ergocalciferol, (DRISDOL) 1.25 MG (50000 UNIT) CAPS capsule Take 50,000 Units by mouth every Wednesday. 08/16/20   [provider]      Allergies    Augmentin [amoxicillin-pot clavulanate], Atorvastatin, Nsaids,  Tolmetin, and Meloxicam    Review of Systems   Review of Systems  Physical Exam Updated Vital Signs BP 111/84   Pulse (!) 56   Temp 97.8 F (36.6 C) (Oral)   Resp 17   Ht '5\' 11"'$  (1.803 m)   Wt 104.3 kg   SpO2 100%   BMI 32.08 kg/m  Physical Exam Constitutional:      General: He is not in acute distress. HENT:     Head: Normocephalic and atraumatic.  Eyes:     Conjunctiva/sclera: Conjunctivae normal.     Pupils: Pupils are equal, round, and reactive to light.  Cardiovascular:     Rate and Rhythm: Normal rate and regular rhythm.  Skin:    General: Skin is warm and dry.     Comments: 1.0 cm shallow laceration across left 5th finger tip pad, no active bleeding  Neurological:     General: No focal deficit present.     Mental Status: He is alert. Mental status is at baseline.    ED Results / Procedures / Treatments   Labs (all labs ordered are listed, but only abnormal results are displayed) Labs Reviewed - No data to display  EKG None  Radiology No results found.  Procedures .Marland KitchenLaceration Repair  Date/Time: 01/30/2022 7:46 PM Performed by: Wyvonnia Dusky, MD Authorized by: Wyvonnia Dusky, MD   Consent:    Consent obtained:  Verbal  Consent given by:  Patient   Risks discussed:  Infection, poor cosmetic result, pain and poor wound healing Universal protocol:    Procedure explained and questions answered to patient or proxy's satisfaction: yes     Immediately prior to procedure, a time out was called: yes     Patient identity confirmed:  Arm band Anesthesia:    Anesthesia method:  None Laceration details:    Location:  Finger   Finger location:  L small finger   Length (cm):  1 Pre-procedure details:    Preparation:  Patient was prepped and draped in usual sterile fashion Treatment:    Irrigation solution:  Sterile water Skin repair:    Repair method:  Tissue adhesive Approximation:    Approximation:  Close Repair type:    Repair type:   Simple Post-procedure details:    Dressing:  Open (no dressing)    Medications Ordered in ED Medications - No data to display  ED Course/ Medical Decision Making/ A&P                           Medical Decision Making Amount and/or Complexity of Data Reviewed ECG/medicine tests: ordered.   Patient is here with an isolated laceration left fifth finger.  The wound was cleaned and Dermabond applied.  Patient reports that her syncopal episodes are strongly suspect vasovagal in the setting of pain from the laceration.  His report is not consistent with significant blood loss for such a small and minor injury, and is not tachycardic or appear pale at this time to suggest ongoing blood loss anemia.  I do not believe any repeat labs at this time, as he had blood work performed as an outpatient last week which showed no significant evidence of dehydration, electrolyte imbalance, or anemia at the time.  We performed an EKG here which I personally viewed and interpreted, showing NSR with no evidence of significant arrhythmia or ischemia.  Patient is otherwise stable for discharge        Final Clinical Impression(s) / ED Diagnoses Final diagnoses:  Laceration of left little finger, foreign body presence unspecified, nail damage status unspecified, initial encounter  Syncope, unspecified syncope type    Rx / DC Orders ED Discharge Orders     None         Wyvonnia Dusky, MD 01/30/22 2028

## 2022-04-11 ENCOUNTER — Other Ambulatory Visit: Payer: Self-pay | Admitting: Gastroenterology

## 2022-04-11 DIAGNOSIS — R12 Heartburn: Secondary | ICD-10-CM

## 2023-01-02 ENCOUNTER — Emergency Department (HOSPITAL_BASED_OUTPATIENT_CLINIC_OR_DEPARTMENT_OTHER): Payer: Medicare Other

## 2023-01-02 ENCOUNTER — Emergency Department (HOSPITAL_BASED_OUTPATIENT_CLINIC_OR_DEPARTMENT_OTHER)
Admission: EM | Admit: 2023-01-02 | Discharge: 2023-01-02 | Disposition: A | Discharge status: Home / Self Care | Payer: Medicare Other | Attending: Emergency Medicine | Admitting: Emergency Medicine

## 2023-01-02 ENCOUNTER — Encounter (HOSPITAL_BASED_OUTPATIENT_CLINIC_OR_DEPARTMENT_OTHER): Payer: Self-pay | Admitting: Emergency Medicine

## 2023-01-02 ENCOUNTER — Other Ambulatory Visit: Payer: Self-pay

## 2023-01-02 DIAGNOSIS — R109 Unspecified abdominal pain: Secondary | ICD-10-CM | POA: Diagnosis present

## 2023-01-02 DIAGNOSIS — Z7982 Long term (current) use of aspirin: Secondary | ICD-10-CM | POA: Diagnosis not present

## 2023-01-02 DIAGNOSIS — R1031 Right lower quadrant pain: Secondary | ICD-10-CM | POA: Diagnosis not present

## 2023-01-02 DIAGNOSIS — R000 Tachycardia, unspecified: Secondary | ICD-10-CM | POA: Insufficient documentation

## 2023-01-02 LAB — COMPREHENSIVE METABOLIC PANEL
ALT: 20 U/L (ref 0–44)
AST: 17 U/L (ref 15–41)
Albumin: 4.5 g/dL (ref 3.5–5.0)
Alkaline Phosphatase: 67 U/L (ref 38–126)
Anion gap: 8 (ref 5–15)
BUN: 15 mg/dL (ref 6–20)
CO2: 24 mmol/L (ref 22–32)
Calcium: 9.8 mg/dL (ref 8.9–10.3)
Chloride: 104 mmol/L (ref 98–111)
Creatinine, Ser: 1.23 mg/dL (ref 0.61–1.24)
GFR, Estimated: >60 mL/min (ref >60)
Glucose, Bld: 97 mg/dL (ref 70–99)
Potassium: 4.2 mmol/L (ref 3.5–5.1)
Sodium: 136 mmol/L (ref 135–145)
Total Bilirubin: 0.5 mg/dL (ref 0.3–1.2)
Total Protein: 7.2 g/dL (ref 6.5–8.1)

## 2023-01-02 LAB — CBC
HCT: 45.3 % (ref 39.0–52.0)
Hemoglobin: 15 g/dL (ref 13.0–17.0)
MCH: 29.8 pg (ref 26.0–34.0)
MCHC: 33.1 g/dL (ref 30.0–36.0)
MCV: 90.1 fL (ref 80.0–100.0)
Platelets: 162 10*3/uL (ref 150–400)
RBC: 5.03 MIL/uL (ref 4.22–5.81)
RDW: 14.5 % (ref 11.5–15.5)
WBC: 9.6 10*3/uL (ref 4.0–10.5)
nRBC: 0 % (ref 0.0–0.2)

## 2023-01-02 LAB — URINALYSIS, ROUTINE W REFLEX MICROSCOPIC
Bilirubin Urine: NEGATIVE
Glucose, UA: NEGATIVE mg/dL
Hgb urine dipstick: NEGATIVE
Ketones, ur: NEGATIVE mg/dL
Leukocytes,Ua: NEGATIVE
Nitrite: NEGATIVE
Protein, ur: NEGATIVE mg/dL
Specific Gravity, Urine: 1.014 (ref 1.005–1.030)
pH: 5.5 (ref 5.0–8.0)

## 2023-01-02 LAB — LIPASE, BLOOD: Lipase: 13 U/L (ref 11–51)

## 2023-01-02 MED ORDER — MORPHINE SULFATE (PF) 4 MG/ML IV SOLN
4.0000 mg | Freq: Once | INTRAVENOUS | Status: AC
  Administered 2023-01-02: 4 mg via INTRAVENOUS
  Filled 2023-01-02: qty 1

## 2023-01-02 NOTE — ED Triage Notes (Signed)
Pt arrives to ED with c/o right sided flank pain x5 days. He notes that he is passing blood clots through his urine.

## 2023-01-02 NOTE — ED Provider Notes (Addendum)
[facility] EMERGENCY DEPARTMENT AT [facility] Provider Note   CSN: [id number] Arrival date & time: 01/02/23  0845     History  Chief Complaint  Patient presents with   Flank Pain    Adam Holloway is a 52 y.o. male history of a flutter, SVT, GERD, pancreatitis presented with 5 days of right flank pain.  Patient states pain radiates from his right flank down to his groin and is a constant sharp pain.  Patient states yesterday he began passing blood clots in his urine but denies any blood thinners such as Eliquis Xarelto or warfarin.  Patient states that he has had kidney stones in the past but states this feels different.  Patient denied any overlying skin color changes or chest pain/shortness of breath.  Patient denied any episodes of emesis but does endorse nausea.  Patient states he still has his gallbladder.  Patient has tried his Percocet at home however still endorses pain.  Patient denied fever/chills, change in sensation/motor skills, fatigue, vision changes, headache, back pain, testicular pain  Home Medications Prior to Admission medications   Medication Sig Start Date End Date Taking? Authorizing Provider  aspirin 81 MG EC tablet Take 81 mg by mouth daily.    [provider]  cyclobenzaprine (FLEXERIL) 10 MG tablet Take 10 mg by mouth 3 (three) times daily as needed. 07/15/21   [provider]  famotidine (PEPCID) 20 MG tablet TAKE 1 TABLET BY MOUTH IN THE MORNING AND AT BEDTIME. TAKE ONE TABLET EVERY 12 HOURS AS NEEDED. 04/13/22   Jenel Lucks, MD  linaclotide [facility]) 145 MCG CAPS capsule Take 1 capsule (145 mcg total) by mouth daily before breakfast. 12/01/21   Jenel Lucks, MD  magnesium oxide (MAG-OX) 400 MG tablet Take 400 mg by mouth daily.    [provider]  oxyCODONE (OXY IR/ROXICODONE) 5 MG immediate release tablet Take 5 mg by mouth 4 (four) times daily as needed. 10/24/21   [provider]  Vitamin D,  Ergocalciferol, (DRISDOL) 1.25 MG (50000 UNIT) CAPS capsule Take 50,000 Units by mouth every Wednesday. 08/16/20   [provider]      Allergies    Augmentin [amoxicillin-pot clavulanate], Atorvastatin, Nsaids, Tolmetin, and Meloxicam    Review of Systems   Review of Systems  Genitourinary:  Positive for flank pain.  See HPI  Physical Exam Updated Vital Signs BP 132/77   Pulse 83   Temp 98.1 F (36.7 C) (Oral)   Resp 14   Ht  (1.854 m)   Wt 102.1 kg   SpO2 100%   BMI 29.69 kg/m  Physical Exam Vitals reviewed.  Constitutional:      General: He is not in acute distress. HENT:     Head: Normocephalic and atraumatic.  Eyes:     Extraocular Movements: Extraocular movements intact.     Conjunctiva/sclera: Conjunctivae normal.     Pupils: Pupils are equal, round, and reactive to light.  Cardiovascular:     Rate and Rhythm: Regular rhythm. Tachycardia present.     Pulses: Normal pulses.     Heart sounds: Normal heart sounds.     Comments: 2+ bilateral radial/dorsalis pedis pulses with regular rate Pulmonary:     Effort: Pulmonary effort is normal. No respiratory distress.     Breath sounds: Normal breath sounds.  Abdominal:     General: There is no distension.     Palpations: Abdomen is soft. There is no mass.  Tenderness: There is abdominal tenderness (Right lower quadrant). There is right CVA tenderness. There is no left CVA tenderness, guarding or rebound.     Hernia: No hernia is present.  Musculoskeletal:        General: No tenderness. Normal range of motion.     Cervical back: Normal range of motion and neck supple.     Right lower leg: No edema.     Left lower leg: No edema.     Comments: 5 out of 5 bilateral grip/leg extension strength  Skin:    General: Skin is warm and dry.     Capillary Refill: Capillary refill takes less than 2 seconds.  Neurological:     General: No focal deficit present.     Mental Status: He is alert and oriented to  person, place, and time.     Comments: Sensation intact in all 4 limbs  Psychiatric:        Mood and Affect: Mood normal.     ED Results / Procedures / Treatments   Labs (all labs ordered are listed, but only abnormal results are displayed) Labs Reviewed  URINALYSIS, ROUTINE W REFLEX MICROSCOPIC  CBC  COMPREHENSIVE METABOLIC PANEL  LIPASE, BLOOD    EKG EKG Interpretation  Date/Time:  Saturday January 02 2023 09:35:28 EDT Ventricular Rate:  81 PR Interval:  168 QRS Duration: 90 QT Interval:  356 QTC Calculation: 414 R Axis:   20 Text Interpretation: Sinus rhythm No significant change was found Confirmed by Glynn Octave [phone]) on 01/02/2023 9:38:10 AM  Radiology CT Renal Stone Study  Result Date: 01/02/2023 CLINICAL DATA:  Right flank pain for 2 days.  Hematuria. EXAM: CT ABDOMEN AND PELVIS WITHOUT CONTRAST TECHNIQUE: Multidetector CT imaging of the abdomen and pelvis was performed following the standard protocol without IV contrast. RADIATION DOSE REDUCTION: This exam was performed according to the departmental dose-optimization program which includes automated exposure control, adjustment of the mA and/or kV according to patient size and/or use of iterative reconstruction technique. COMPARISON:  12/22/2018 FINDINGS: Lower chest: No acute findings. Hepatobiliary: No mass visualized on this unenhanced exam. Prior cholecystectomy. No evidence of biliary obstruction. Pancreas: No mass or inflammatory process visualized on this unenhanced exam. Spleen:  Within normal limits in size. Adrenals/Urinary tract: No evidence of urolithiasis or hydronephrosis. Unremarkable unopacified urinary bladder. Stomach/Bowel: No evidence of obstruction, inflammatory process, or abnormal fluid collections. Normal appendix visualized. Vascular/Lymphatic: No pathologically enlarged lymph nodes identified. No evidence of abdominal aortic aneurysm. Aortic atherosclerotic calcification incidentally noted.  Reproductive:  No mass or other significant abnormality. Other:  None. Musculoskeletal:  No suspicious bone lesions identified. IMPRESSION: No evidence of urolithiasis, hydronephrosis, or other acute findings. Electronically Signed   By: Danae Orleans M.D.   On: 01/02/2023 10:01    Procedures Ultrasound ED Peripheral IV (Provider)  Date/Time: 01/02/2023 9:19 AM  Performed by: Netta Corrigan, PA-C Authorized by: Netta Corrigan, PA-C   Procedure details:    Indications: multiple failed IV attempts     Skin Prep: chlorhexidine gluconate     Location:  Right AC   Angiocath:  20 G   Bedside Ultrasound Guided: Yes     Images: archived     Patient tolerated procedure without complications: Yes     Dressing applied: No       Medications Ordered in ED Medications  morphine (PF) 4 MG/ML injection 4 mg (4 mg Intravenous Given 01/02/23 6045)    ED Course/ Medical Decision Making/ A&P  Medical Decision Making Amount and/or Complexity of Data Reviewed Labs: ordered. Radiology: ordered.  Risk Prescription drug management.   Adam Holloway 52 y.o. presented today for right flank pain. Working DDx that I considered at this time includes, but not limited to, gastroenteritis, colitis, small bowel obstruction, appendicitis, cholecystitis, pancreatitis, nephrolithiasis, UTI, pyleonephritis, testicular torsion.  R/o DDx: gastroenteritis, colitis, small bowel obstruction, appendicitis, cholecystitis, pancreatitis, nephrolithiasis, UTI, pyleonephritis, testicular torsion: These are considered less likely due to history of present illness and physical exam findings.  Review of prior external notes: 11/04/2022 ED  Unique Tests and My Interpretation:  CBC with differential: Unremarkable CMP: Unremarkable Lipase: Unremarkable UA: Unremarkable EKG: Rate, rhythm, axis, intervals all examined and without medically relevant abnormality. ST segments without concerns  for elevations CT Renal Study: No acute pyelonephritis, nephrolithiasis, hydronephrosis  Discussion with Independent Historian:  Girlfriend  Discussion of Management of Tests: None  Risk: Low: based on diagnostic testing/clinical impression and treatment plan  Risk Stratification Score: None  Staffed with Rancour, MD   Plan: Patient presented for right flank pain. On exam patient was in no acute distress and had stable vitals however he was tachycardic at 101.  On exam patient had right CVA tenderness along with right lower quadrant pain but no peritoneal signs were noted.  Patient denied history of fevers or emesis but did endorse nausea from yesterday however he is not nauseous now.  Patient endorsed right flank pain that radiated down to his groin and endorsed hematuria.  Labs and imaging will be ordered to assess for possible stone causing patient's symptoms.  I placed an IV using ultrasound due to multiple failed IV attempts.  Patient still endorsed pain even after having his Percocet at 5 AM this morning and will be given morphine for symptom management.  Patient stable at this time.  CT and labs were negative for any acute findings.  I spoke to the patient and he stated his urine has been clear since yesterday indicating that patient may have already passed the stone.  Due to patient already being on oxycodone Flexeril would not be prescribed for possible muscle strain as there is an interaction between the 2 and this was verbalized with the patient.  I spoke to the patient will possibly ultrasounding his right testicle however patient stated that it would not be necessary as he does not feel his pain is coming from his testicle.  I talked with the patient about the risk and benefit of not doing the ultrasound and patient with full decision-making capacity continue to decline ultrasound.  I encouraged the patient to follow-up with his primary care provider regarding recent symptoms and ER  visit as symptoms may change.  I encouraged patient to continue taking his pain meds as prescribed and to monitor symptoms.  Patient stated he is happy to leave knowing that the labs and CT scan did not show any signs of hernia or stone or infection.  Patient was given return precautions.patient stable for discharge at this time.  Patient verbalized understanding of plan.         Final Clinical Impression(s) / ED Diagnoses Final diagnoses:  Flank pain    Rx / DC Orders ED Discharge Orders     None         Remi Deter 01/02/23 1027    Glynn Octave, MD 01/02/23 1924

## 2023-01-02 NOTE — Discharge Instructions (Signed)
Please follow-up with your primary care fighter regarding recent symptoms and ER visit.  Today the CT scan and labs did not show any signs of infection, hernia, stone causing your symptoms.  Due to the interaction between Percocet and Flexeril we will forego the muscle relaxer and I want you to continue taking your medications as prescribed.  Please monitor your symptoms and if symptoms worsen please return to ER.

## 2023-01-02 NOTE — ED Notes (Signed)
Assisting primary RN. Pt provided pain medication; family at bedside and call light in reach.

## 2023-02-03 ENCOUNTER — Ambulatory Visit (INDEPENDENT_AMBULATORY_CARE_PROVIDER_SITE_OTHER): Payer: Medicare Other | Admitting: Physical Medicine and Rehabilitation

## 2023-02-03 ENCOUNTER — Encounter: Payer: Self-pay | Admitting: Physical Medicine and Rehabilitation

## 2023-02-03 DIAGNOSIS — M5442 Lumbago with sciatica, left side: Secondary | ICD-10-CM | POA: Diagnosis not present

## 2023-02-03 DIAGNOSIS — M5416 Radiculopathy, lumbar region: Secondary | ICD-10-CM | POA: Diagnosis not present

## 2023-02-03 DIAGNOSIS — M5441 Lumbago with sciatica, right side: Secondary | ICD-10-CM

## 2023-02-03 DIAGNOSIS — G894 Chronic pain syndrome: Secondary | ICD-10-CM

## 2023-02-03 DIAGNOSIS — G8929 Other chronic pain: Secondary | ICD-10-CM

## 2023-02-03 NOTE — Progress Notes (Signed)
Adam Holloway - 51 y.o. male MRN [id number]  Date of birth: November 23, 1970  Office Visit Note: Visit Date: 02/03/2023 PCP: Pcp, No Referred by: No ref. provider found  Subjective: Chief Complaint  Patient presents with   Lower Back - Pain   HPI: Adam Holloway is a 52 y.o. male who comes in today as a self referral for evaluation of acute on chronic bilateral lower back pain radiating to right flank region and down both legs. He was previously treated by Dr. Norlene Campbell. Pain ongoing for several years, worsened about 1 month ago. Pain worsens with prolonged sitting and bending, currently rates as 10 out of 10. He describes pain as sore, aching and burning sensation to legs. Some relief of pain with home exercise regimen, rest and use of medications. He is being managed for chronic pain by Dr. Aris Everts at [facility], he is currently prescribed 168 tablets of 10 mg Oxycodone IR. States he does not feel Oxycodone helps to alleviate his pain. Lumbar MRI imaging from 2020 exhibits multi level degenerative changes, no nerve compression, no high grade spinal canal stenosis. No history of lumbar surgery/injections.  Patient underwent thoracic epidural steroid injection many years ago with Dr. Ewing Schlein in [place], no relief of pain with this procedure. States he is unable to undergo any type of epidural steroid injection as he does not tolerate this procedure well. He reports adverse reactions to corticosteroid medications that are used for these injections. Of note, patient was scheduled to undergo cervical epidural steroid injection in our office in January of 2023. He ultimately decided against injection due to heart issues. He is currently being treated by Dr. Harvie Bridge with [facility] for chronic bilateral neck issues. Per office visit note with Dr. Loistine Simas on 11/27/2022, referral to pain management was recommended. Patient does carry diagnosis of atrial  flutter, SVT, neuropathy, chronic pain and PTSD. Patient denies focal weakness. No recent trauma or falls.    Review of Systems  Musculoskeletal:  Positive for back pain and myalgias.  Neurological:  Negative for tingling, sensory change, focal weakness and weakness.  All other systems reviewed and are negative.  Otherwise per HPI.  Assessment & Plan: Visit Diagnoses:    ICD-10-CM   1. Chronic bilateral low back pain with bilateral sciatica  M54.42    M54.41    G89.29     2. Lumbar radiculopathy  M54.16     3. Chronic pain syndrome  G89.4        Plan: Findings:  Acute on chronic bilateral lower back pain radiating to right flank region and down bilateral legs. Patient continues to have severe pain despite good conservative therapies such as home exercise regimen, rest and use of medications.  Patient's clinical presentation and exam are complex, his pain pattern seems more non-dermatomal in nature. Lumbar MRI imaging from 2020 does not show any severe nerve compression/high grade spinal canal stenosis. I discussed treatment plan with patient in detail today. Unfortunately, he is not able to undergo lumbar epidural steroid injections due to issues with tachycardia. I discussed medication management as well, would recommend he follow up with Dr. Jennette Banker for continued chronic pain management. I am limited in medications that I can prescribe as he is not able to take anti-inflammatories, muscle relaxer's and does not tolerate oral steroids. He does not wish to attend formal physical therapy.  Interestingly patient did mention during the last part of our visit that he is being  evaluated by spine specialist at [facility] in the coming weeks that can manage his lumbar spine. At this point, we have exhausted all possible treatments and feel it would be best for patient to be seen at tertiary care center. No red flag symptoms noted upon exam today.     Meds & Orders: No orders of the defined types were  placed in this encounter.  No orders of the defined types were placed in this encounter.   Follow-up: Return if symptoms worsen or fail to improve.   Procedures: No procedures performed      Clinical History: NEUROIMAGING REPORT     STUDY DATE: 05/19/2019 PATIENT NAME: Adam Holloway DOB: Feb 22, 1971 MRN: [id number]   EXAM: MRI of the lumbar spine without contrast   ORDERING CLINICIAN: York Spaniel, MD CLINICAL HISTORY: 52 year old man with sciatica COMPARISON FILMS: MRI 03/10/2011   TECHNIQUE: MRI of the lumbar spine was obtained utilizing 4 mm sagittal slices from T11-12 down to the lower sacrum with T1, T2 and inversion recovery views. In addition 4 mm axial slices from L1-2 down to L5-S1 level were included with T1 and T2 weighted views. CONTRAST: None IMAGING SITE: [facility], [address], [place], [place]   FINDINGS: On sagittal images, the spine is imaged from T11 to the sacrum.   The conus medullaris and cauda equine appear normal.   The vertebral bodies are normally aligned.   The vertebral bodies have normal signal.     The discs and interspaces were further evaluated on axial views from T12to S1 as follows:   T11-T12: (Sagittal views only).  There is disc bulging but no foraminal narrowing.  Nerve root compression or spinal stenosis.   T12-L1: The disc and interspace appear normal.   L1-L2: The disc and interspace appear normal.   L2-L3: The disc and interspace appear normal.   L3-L4: There is mild facet hypertrophy.  The central canal is mildly narrowed but not enough to be considered spinal stenosis.  There is no nerve root compression.   L4-L5: There is facet hypertrophy and mild disc bulging causing mild spinal stenosis.  There is no foraminal narrowing and mild lateral recess stenosis.  No nerve root compression.   L5-S1: There is a small central disc bulge.  There is mild lateral recess stenosis but no foraminal narrowing or spinal stenosis.   No nerve root compression.   No significant changes compared to the 03/10/2011 MRI.     IMPRESSION: This MRI of the lumbar spine without contrast shows degenerative changes as detailed above.  The most significant findings are:: 1.  At L4-L5, there is mild spinal stenosis due to facet hypertrophy and mild disc bulging.  There is mild lateral recess stenosis but no nerve root compression. 2.  At L5-S1, there is a small central disc bulge.  There is mild lateral recess stenosis but no spinal stenosis or nerve root compression.   He reports that he has been smoking cigarettes. He has a 16.00 pack-year smoking history. He has quit using smokeless tobacco. No results for input(s): "HGBA1C", "LABURIC" in the last 8760 hours.  Objective:  VS:  HT:    WT:   BMI:     BP:   HR: bpm  TEMP: ( )  RESP:  Physical Exam Vitals and nursing note reviewed.  HENT:     Head: Normocephalic and atraumatic.     Right Ear: External ear normal.     Left Ear: External ear normal.  Nose: Nose normal.     Mouth/Throat:     Mouth: Mucous membranes are moist.  Eyes:     Extraocular Movements: Extraocular movements intact.  Cardiovascular:     Rate and Rhythm: Normal rate.     Pulses: Normal pulses.  Pulmonary:     Effort: Pulmonary effort is normal.  Abdominal:     General: Abdomen is flat. There is no distension.  Musculoskeletal:        General: Tenderness present.     Cervical back: Normal range of motion.     Comments: Patient rises from seated position to standing without difficulty. Good lumbar range of motion. No pain noted with facet loading. 5/5 strength noted with bilateral hip flexion, knee flexion/extension, ankle dorsiflexion/plantarflexion and EHL. No clonus noted bilaterally. No pain upon palpation of greater trochanters. No pain with internal/external rotation of bilateral hips. Sensation intact bilaterally. Negative slump test bilaterally. Ambulates without aid, gait steady.     Skin:     General: Skin is warm and dry.     Capillary Refill: Capillary refill takes less than 2 seconds.  Neurological:     General: No focal deficit present.     Mental Status: He is alert and oriented to person, place, and time.  Psychiatric:        Mood and Affect: Mood normal.        Behavior: Behavior normal.     Ortho Exam  Imaging: No results found.  Past Medical/Family/Surgical/Social History: Medications & Allergies reviewed per EMR, new medications updated. Patient Active Problem List   Diagnosis Date Noted   Centrilobular emphysema (HCC) 02/21/2021   Typical atrial flutter (HCC) 02/21/2021   Palpitations 12/17/2020   Atypical atrial flutter (HCC) 10/29/2020   Left tennis elbow 07/10/2020   Weight loss 06/10/2020   Vitamin D deficiency 04/11/2020   Gastroesophageal reflux disease without esophagitis 04/11/2020   Anxiety 04/11/2020   Pain in left wrist 12/13/2019   Pain in left elbow 12/13/2019   Neuropathy 04/12/2019   Low vitamin B12 level 02/20/2019   Leukocytosis 02/12/2019   Right-sided chest pain 02/12/2019   Neck pain 12/07/2018   HA (headache)    Pain in soft tissues of limb    Lumbago    Gynecomastia, male 07/12/2013   Past Medical History:  Diagnosis Date   Arthritis    back, left wrist, left knee   Complication of anesthesia    bp dropped during gallbladder surgery in 2008   Depression    Family history of adverse reaction to anesthesia    mom stopped breathing during colonscopy   GERD (gastroesophageal reflux disease)    h/o   HA (headache)    h/o migrianes   Herniated disc, cervical    History of kidney stones    h/o stones   History of leukocytosis    Lumbago    Pain in soft tissues of limb    Pancreatitis    Peripheral neuropathy 04/12/2019   PTSD (post-traumatic stress disorder)    Scoliosis    Family History  Problem Relation Age of Onset   Colon cancer Mother    Heart Problems Father    Lung cancer Father    Liver cancer Brother     Past Surgical History:  Procedure Laterality Date   A-FLUTTER ABLATION N/A 11/11/2020   Procedure: A-FLUTTER ABLATION;  Surgeon: Lanier Prude, MD;  [facility]VASIVE CV LAB;  Service: Cardiovascular;  Laterality: N/A;   CHOLECYSTECTOMY  2008  COLONOSCOPY  2010   COLONOSCOPY WITH PROPOFOL N/A 06/09/2016   Procedure: COLONOSCOPY WITH PROPOFOL;  Surgeon: Kieth Brightly, MD;  Location: [facility] ENDOSCOPY;  Service: Endoscopy;  Laterality: N/A;   ESOPHAGOGASTRODUODENOSCOPY     FISSURECTOMY N/A 06/29/2016   Procedure: FISSURECTOMY;  Surgeon: Kieth Brightly, MD;  Location: [facility] ORS;  Service: General;  Laterality: N/A;   HEMORRHOID SURGERY N/A 06/29/2016   Procedure: HEMORRHOIDECTOMY;  Surgeon: Kieth Brightly, MD;  Location: [facility] ORS;  Service: General;  Laterality: N/A;   LOOP RECORDER IMPLANT  01/2021   PAROTIDECTOMY Left 1997   SALIVARY GLAND SURGERY  1997   SPHINCTEROTOMY N/A 06/29/2016   Procedure: SPHINCTEROTOMY;  Surgeon: Kieth Brightly, MD;  Location: [facility] ORS;  Service: General;  Laterality: N/A;   WRIST ARTHROSCOPY Left 01/26/2017   Procedure: ARTHROSCOPY LEFT WRIST DEBRIDEMENT;  Surgeon: Cindee Salt, MD;  Location: [facility];  Service: Orthopedics;  Laterality: Left;   WRIST SURGERY  2019   Social History   Occupational History    Comment: Disabled  Tobacco Use   Smoking status: Some Days    Packs/day: 0.50    Years: 32.00    Additional pack years: 0.00    Total pack years: 16.00    Types: Cigarettes    Last attempt to quit: 11/08/2020    Years since quitting: 2.2   Smokeless tobacco: Former   Tobacco comments:    Quit 2020 and restarted 10/2019 1-2 cig/day  States he quit 2 days before procedure 2022  Vaping Use   Vaping Use: Never used  Substance and Sexual Activity   Alcohol use: No    Alcohol/week: 0.0 standard drinks of alcohol   Drug use: No   Sexual activity: Not on file

## 2023-02-03 NOTE — Progress Notes (Signed)
Low back/ right sided pain 1 month of pain Sitting is the worst; it travels down legs  Oxycodone for the pain- no relief  Radicular pain/ numbness/ tingling Has not had any xrays of the back  MRI back in 2020

## 2023-04-22 ENCOUNTER — Other Ambulatory Visit: Payer: Self-pay | Admitting: Gastroenterology

## 2023-04-22 DIAGNOSIS — R12 Heartburn: Secondary | ICD-10-CM

## 2024-08-13 ENCOUNTER — Other Ambulatory Visit: Payer: Self-pay | Admitting: Gastroenterology

## 2024-08-13 DIAGNOSIS — R12 Heartburn: Secondary | ICD-10-CM

## 2024-08-15 ENCOUNTER — Telehealth: Payer: Self-pay | Admitting: Gastroenterology

## 2024-08-15 DIAGNOSIS — R12 Heartburn: Secondary | ICD-10-CM

## 2024-08-15 MED ORDER — FAMOTIDINE 20 MG PO TABS: 20.0000 mg | ORAL_TABLET | Freq: Two times a day (BID) | ORAL | 1 refills | Status: AC

## 2024-08-15 NOTE — Telephone Encounter (Signed)
 PT is asking to have a refill for famotidine  sent to [facility]

## 2024-09-18 ENCOUNTER — Emergency Department

## 2024-09-18 ENCOUNTER — Other Ambulatory Visit: Payer: Self-pay

## 2024-09-18 ENCOUNTER — Encounter: Payer: Self-pay | Admitting: Emergency Medicine

## 2024-09-18 ENCOUNTER — Emergency Department
Admission: EM | Admit: 2024-09-18 | Discharge: 2024-09-18 | Disposition: A | Discharge status: Home / Self Care | Attending: Emergency Medicine | Admitting: Emergency Medicine

## 2024-09-18 DIAGNOSIS — G8929 Other chronic pain: Secondary | ICD-10-CM | POA: Diagnosis not present

## 2024-09-18 DIAGNOSIS — R079 Chest pain, unspecified: Secondary | ICD-10-CM | POA: Insufficient documentation

## 2024-09-18 DIAGNOSIS — M546 Pain in thoracic spine: Secondary | ICD-10-CM | POA: Diagnosis present

## 2024-09-18 LAB — CBC WITH DIFFERENTIAL/PLATELET
Abs Immature Granulocytes: 0.02 K/uL (ref 0.00–0.07)
Basophils Absolute: 0 K/uL (ref 0.0–0.1)
Basophils Relative: 0 %
Eosinophils Absolute: 0.1 K/uL (ref 0.0–0.5)
Eosinophils Relative: 1 %
HCT: 40.3 % (ref 39.0–52.0)
Hemoglobin: 13.2 g/dL (ref 13.0–17.0)
Immature Granulocytes: 0 %
Lymphocytes Relative: 24 %
Lymphs Abs: 2 K/uL (ref 0.7–4.0)
MCH: 30.1 pg (ref 26.0–34.0)
MCHC: 32.8 g/dL (ref 30.0–36.0)
MCV: 92 fL (ref 80.0–100.0)
Monocytes Absolute: 0.7 K/uL (ref 0.1–1.0)
Monocytes Relative: 9 %
Neutro Abs: 5.6 K/uL (ref 1.7–7.7)
Neutrophils Relative %: 66 %
Platelets: 155 K/uL (ref 150–400)
RBC: 4.38 MIL/uL (ref 4.22–5.81)
RDW: 14.6 % (ref 11.5–15.5)
WBC: 8.5 K/uL (ref 4.0–10.5)
nRBC: 0 % (ref 0.0–0.2)

## 2024-09-18 LAB — COMPREHENSIVE METABOLIC PANEL WITH GFR
ALT: 44 U/L (ref 0–44)
AST: 27 U/L (ref 15–41)
Albumin: 4.2 g/dL (ref 3.5–5.0)
Alkaline Phosphatase: 73 U/L (ref 38–126)
Anion gap: 11 (ref 5–15)
BUN: 15 mg/dL (ref 6–20)
CO2: 24 mmol/L (ref 22–32)
Calcium: 9.4 mg/dL (ref 8.9–10.3)
Chloride: 104 mmol/L (ref 98–111)
Creatinine, Ser: 0.87 mg/dL (ref 0.61–1.24)
GFR, Estimated: >60 mL/min
Glucose, Bld: 104 mg/dL — ABNORMAL HIGH (ref 70–99)
Potassium: 4.6 mmol/L (ref 3.5–5.1)
Sodium: 139 mmol/L (ref 135–145)
Total Bilirubin: 0.3 mg/dL (ref 0.0–1.2)
Total Protein: 6.6 g/dL (ref 6.5–8.1)

## 2024-09-18 LAB — TROPONIN T, HIGH SENSITIVITY
Troponin T High Sensitivity: <15 ng/L (ref 0–19)
Troponin T High Sensitivity: <15 ng/L (ref 0–19)

## 2024-09-18 LAB — LIPASE, BLOOD: Lipase: 23 U/L (ref 11–51)

## 2024-09-18 LAB — D-DIMER, QUANTITATIVE: D-Dimer, Quant: 0.39 ug{FEU}/mL (ref 0.00–0.50)

## 2024-09-18 MED ORDER — OXYCODONE HCL 5 MG PO TABS
5.0000 mg | ORAL_TABLET | Freq: Once | ORAL | Status: AC
  Administered 2024-09-18: 5 mg via ORAL
  Filled 2024-09-18: qty 1

## 2024-09-18 NOTE — ED Provider Notes (Signed)
 "  [facility] Provider Note    Event Date/Time   First MD Initiated Contact with Patient 09/18/24 1037     (approximate)   History   Back Pain   HPI  Adam Holloway is a 54 y.o. male with a past medical history of hyperlipidemia, prediabetes, paroxysmal atrial fibrillation, anxiety, GERD, paroxysmal SVT status post ablation in 2022 who presents today for evaluation of chest pain and back pain.  Patient reports that this morning around 830 he walked inside his house and felt sudden sharp pain in his chest and upper back.  He reports that it felt like his heart was going crazy.  He felt dizzy.  He reports that his heart rate now feels back to normal but he continues to have chest pain and back pain.  He denies abdominal pain.  He recently stopped aspirin 1 week ago.  Patient Active Problem List   Diagnosis Date Noted   Centrilobular emphysema (HCC) 02/21/2021   Typical atrial flutter (HCC) 02/21/2021   Palpitations 12/17/2020   Atypical atrial flutter (HCC) 10/29/2020   Left tennis elbow 07/10/2020   Weight loss 06/10/2020   Vitamin D deficiency 04/11/2020   Gastroesophageal reflux disease without esophagitis 04/11/2020   Anxiety 04/11/2020   Pain in left wrist 12/13/2019   Pain in left elbow 12/13/2019   Neuropathy 04/12/2019   Low vitamin B12 level 02/20/2019   Leukocytosis 02/12/2019   Right-sided chest pain 02/12/2019   Neck pain 12/07/2018   HA (headache)    Pain in soft tissues of limb    Lumbago    Gynecomastia, male 07/12/2013          Physical Exam   Triage Vital Signs: ED Triage Vitals [09/18/24 1034]  Encounter Vitals Group     BP 134/77     Girls Systolic BP Percentile      Girls Diastolic BP Percentile      Boys Systolic BP Percentile      Boys Diastolic BP Percentile      Pulse Rate 81     Resp 18     Temp 97.7 F (36.5 C)     Temp Source Oral     SpO2 99 %     Weight      Height      Head Circumference      Peak  Flow      Pain Score      Pain Loc      Pain Education      Exclude from Growth Chart     Most recent vital signs: Vitals:   09/18/24 1034 09/18/24 1432  BP: 134/77 130/71  Pulse: 81 80  Resp: 18 12  Temp: 97.7 F (36.5 C)   SpO2: 99% 97%    Physical Exam Vitals and nursing note reviewed.  Constitutional:      General: Awake and alert. No acute distress.    Appearance: Normal appearance. The patient is normal weight.  HENT:     Head: Normocephalic and atraumatic.     Mouth: Mucous membranes are moist.  Eyes:     General: PERRL. Normal EOMs        Right eye: No discharge.        Left eye: No discharge.     Conjunctiva/sclera: Conjunctivae normal.  Cardiovascular:     Rate and Rhythm: Normal rate and regular rhythm.     Pulses: Normal pulses.  Equal in all 4 extremities Pulmonary:  Effort: Pulmonary effort is normal. No respiratory distress.     Breath sounds: Normal breath sounds.  Abdominal:     Abdomen is soft. There is no abdominal tenderness. No rebound or guarding. No distention. Musculoskeletal:        General: No swelling. Normal range of motion.     Cervical back: Normal range of motion and neck supple.  Skin:    General: Skin is warm and dry.     Capillary Refill: Capillary refill takes less than 2 seconds.     Findings: No rash.  Neurological:     Mental Status: The patient is awake and alert.      ED Results / Procedures / Treatments   Labs (all labs ordered are listed, but only abnormal results are displayed) Labs Reviewed  COMPREHENSIVE METABOLIC PANEL WITH GFR - Abnormal; Notable for the following components:      Result Value   Glucose, Bld 104 (*)    All other components within normal limits  CBC WITH DIFFERENTIAL/PLATELET  LIPASE, BLOOD  D-DIMER, QUANTITATIVE  TROPONIN T, HIGH SENSITIVITY  TROPONIN T, HIGH SENSITIVITY     EKG     RADIOLOGY I independently reviewed and interpreted imaging and agree with radiologists  findings.     PROCEDURES:  Critical Care performed:   Procedures   MEDICATIONS ORDERED IN ED: Medications  oxyCODONE  (Oxy IR/ROXICODONE ) immediate release tablet 5 mg (5 mg Oral Given 09/18/24 1333)     IMPRESSION / MDM / ASSESSMENT AND PLAN / ED COURSE  I reviewed the triage vital signs and the nursing notes.   Differential diagnosis includes, but is not limited to, arrhythmia, acute coronary syndrome, pulmonary embolism, less likely aortic dissection.  Patient is awake and alert, hemodynamically stable and afebrile.  I reviewed the patient's chart.  He follows closely with cardiology, most recently saw 09/11/2024.  Further workup is indicated.  IV was established and labs were obtained.  Labs are overall reassuring with negative troponin x 2, negative D-dimer.  No pleurisy, no hemoptysis, no clinical signs or symptoms of PE or DVT, and with negative D-dimer do not suspect pulmonary embolism.  Normal mediastinal width on chest x-ray, and with negative D-dimer, normal vital signs, normal blood work unlikely to be aortic dissection.  He was monitored in the emergency department for over 4 hours with improvement of his symptoms.  He was treated symptomatically with his home medicine with improvement of his symptoms.  Symptoms do appear to be consistent with his chronic back pain which is in this location, especially in the setting of his negative workup today.  He has normal strength and sensation in all 4 extremities, no radicular symptoms.  We discussed return precautions and outpatient follow-up.  He reports that he has a pain management provider who encouraged him to follow-up with.  Also instructed to follow-up with his cardiologist.  He understands return precautions in the meantime.  Patient was discharged in stable condition.   Patient's presentation is most consistent with acute presentation with potential threat to life or bodily function.  Clinical Course as of 09/18/24 1646   Mon Sep 18, 2024  1320 Patient requesting his home dose of pain medicine.  He has an allergy to NSAIDs.  Will give oxycodone  [JP]  1424 Patient reports that he feels improved and ready for discharge home [JP]    Clinical Course User Index [JP] Sina Sumpter, Andriette BRAVO, PA-C     FINAL CLINICAL IMPRESSION(S) / ED DIAGNOSES   Final diagnoses:  Chronic bilateral thoracic back pain     Rx / DC Orders   ED Discharge Orders     None        Note:  This document was prepared using Dragon voice recognition software and may include unintentional dictation errors.   Neylan Koroma E, PA-C 09/18/24 1647    Arlander Charleston, MD 09/19/24 0715  "

## 2024-09-18 NOTE — Discharge Instructions (Signed)
 Your blood tests and x-ray were normal today.  Please follow-up with your outpatient provider.  Please return for any new, worsening, or changing symptoms or other concerns.  It was a pleasure caring for you today.

## 2024-09-18 NOTE — ED Triage Notes (Signed)
 Presents via EMS from home   Sudden onset of mid back pain which moves into chest  Pain increases with  movement and deep breathing Afebrile on arrival
